# Patient Record
Sex: Female | Born: 1965
Health system: Southern US, Community
[De-identification: ages and names within clinical notes are randomized; demographics above are authoritative.]

## PROBLEM LIST (undated history)

## (undated) DIAGNOSIS — K449 Diaphragmatic hernia without obstruction or gangrene: Secondary | ICD-10-CM

## (undated) DIAGNOSIS — F419 Anxiety disorder, unspecified: Secondary | ICD-10-CM

## (undated) DIAGNOSIS — C439 Malignant melanoma of skin, unspecified: Secondary | ICD-10-CM

## (undated) DIAGNOSIS — K279 Peptic ulcer, site unspecified, unspecified as acute or chronic, without hemorrhage or perforation: Secondary | ICD-10-CM

## (undated) DIAGNOSIS — J42 Unspecified chronic bronchitis: Secondary | ICD-10-CM

## (undated) DIAGNOSIS — J45909 Unspecified asthma, uncomplicated: Secondary | ICD-10-CM

## (undated) DIAGNOSIS — M4316 Spondylolisthesis, lumbar region: Secondary | ICD-10-CM

## (undated) DIAGNOSIS — K219 Gastro-esophageal reflux disease without esophagitis: Secondary | ICD-10-CM

## (undated) DIAGNOSIS — Z973 Presence of spectacles and contact lenses: Secondary | ICD-10-CM

## (undated) DIAGNOSIS — N393 Stress incontinence (female) (male): Secondary | ICD-10-CM

## (undated) HISTORY — DX: Malignant melanoma of skin, unspecified: C43.9

## (undated) HISTORY — DX: Unspecified asthma, uncomplicated: J45.909

## (undated) HISTORY — DX: Peptic ulcer, site unspecified, unspecified as acute or chronic, without hemorrhage or perforation: K27.9

## (undated) HISTORY — PX: ESOPHAGOGASTRODUODENOSCOPY: SHX1529

## (undated) HISTORY — PX: SPINE SURGERY: SHX786

## (undated) HISTORY — DX: Anxiety disorder, unspecified: F41.9

## (undated) HISTORY — PX: KNEE SURGERY: SHX244

## (undated) HISTORY — DX: Gastro-esophageal reflux disease without esophagitis: K21.9

---

## 2002-02-04 HISTORY — PX: KNEE ARTHROSCOPY: SUR90

## 2003-11-09 ENCOUNTER — Emergency Department (HOSPITAL_COMMUNITY): Admission: EM | Admit: 2003-11-09 | Discharge: 2003-11-09 | Payer: Self-pay | Admitting: Emergency Medicine

## 2004-11-20 ENCOUNTER — Emergency Department (HOSPITAL_COMMUNITY): Admission: EM | Admit: 2004-11-20 | Discharge: 2004-11-21 | Payer: Self-pay | Admitting: Emergency Medicine

## 2008-11-30 ENCOUNTER — Emergency Department (HOSPITAL_COMMUNITY): Admission: EM | Admit: 2008-11-30 | Discharge: 2008-11-30 | Payer: Self-pay | Admitting: Family Medicine

## 2009-01-23 ENCOUNTER — Emergency Department (HOSPITAL_COMMUNITY): Admission: EM | Admit: 2009-01-23 | Discharge: 2009-01-23 | Payer: Self-pay | Admitting: Family Medicine

## 2009-03-31 ENCOUNTER — Ambulatory Visit: Payer: Self-pay | Admitting: Family Medicine

## 2009-03-31 ENCOUNTER — Encounter: Payer: Self-pay | Admitting: Family Medicine

## 2009-03-31 DIAGNOSIS — R5383 Other fatigue: Secondary | ICD-10-CM

## 2009-03-31 DIAGNOSIS — R5381 Other malaise: Secondary | ICD-10-CM | POA: Insufficient documentation

## 2009-03-31 DIAGNOSIS — F17200 Nicotine dependence, unspecified, uncomplicated: Secondary | ICD-10-CM | POA: Insufficient documentation

## 2009-03-31 DIAGNOSIS — K644 Residual hemorrhoidal skin tags: Secondary | ICD-10-CM | POA: Insufficient documentation

## 2009-04-03 LAB — CONVERTED CEMR LAB
ALT: 10 units/L (ref 0–35)
AST: 14 units/L (ref 0–37)
Albumin: 4.6 g/dL (ref 3.5–5.2)
Alkaline Phosphatase: 15 units/L — ABNORMAL LOW (ref 39–117)
BUN: 16 mg/dL (ref 6–23)
Basophils Absolute: 0.1 10*3/uL (ref 0.0–0.1)
Basophils Relative: 1 % (ref 0–1)
CO2: 27 meq/L (ref 19–32)
Calcium: 10.4 mg/dL (ref 8.4–10.5)
Chloride: 101 meq/L (ref 96–112)
Creatinine, Ser: 0.97 mg/dL (ref 0.40–1.20)
Eosinophils Absolute: 0.1 10*3/uL (ref 0.0–0.7)
Eosinophils Relative: 1 % (ref 0–5)
Glucose, Bld: 91 mg/dL (ref 70–99)
HCT: 39.9 % (ref 36.0–46.0)
Hemoglobin: 13.2 g/dL (ref 12.0–15.0)
Lymphocytes Relative: 26 % (ref 12–46)
Lymphs Abs: 1.8 10*3/uL (ref 0.7–4.0)
MCHC: 33.1 g/dL (ref 30.0–36.0)
MCV: 89.7 fL (ref 78.0–100.0)
Monocytes Absolute: 0.4 10*3/uL (ref 0.1–1.0)
Monocytes Relative: 6 % (ref 3–12)
Neutro Abs: 4.4 10*3/uL (ref 1.7–7.7)
Neutrophils Relative %: 66 % (ref 43–77)
Platelets: 296 10*3/uL (ref 150–400)
Potassium: 4.2 meq/L (ref 3.5–5.3)
RBC: 4.45 M/uL (ref 3.87–5.11)
RDW: 13.1 % (ref 11.5–15.5)
Sodium: 141 meq/L (ref 135–145)
TSH: 2.11 microintl units/mL (ref 0.350–4.500)
Total Bilirubin: 0.4 mg/dL (ref 0.3–1.2)
Total Protein: 7.2 g/dL (ref 6.0–8.3)
WBC: 6.7 10*3/uL (ref 4.0–10.5)

## 2009-05-08 ENCOUNTER — Encounter: Payer: Self-pay | Admitting: Family Medicine

## 2009-05-08 ENCOUNTER — Ambulatory Visit: Payer: Self-pay | Admitting: Family Medicine

## 2009-05-08 DIAGNOSIS — L130 Dermatitis herpetiformis: Secondary | ICD-10-CM | POA: Insufficient documentation

## 2009-05-11 ENCOUNTER — Encounter: Payer: Self-pay | Admitting: Family Medicine

## 2009-10-08 ENCOUNTER — Emergency Department (HOSPITAL_COMMUNITY): Admission: EM | Admit: 2009-10-08 | Discharge: 2009-10-08 | Payer: Self-pay | Admitting: Family Medicine

## 2009-10-19 ENCOUNTER — Encounter: Payer: Self-pay | Admitting: Family Medicine

## 2009-10-19 ENCOUNTER — Ambulatory Visit: Payer: Self-pay | Admitting: Family Medicine

## 2009-10-19 ENCOUNTER — Encounter: Payer: Self-pay | Admitting: Sports Medicine

## 2009-10-19 LAB — CONVERTED CEMR LAB
Pap Smear: NEGATIVE
Whiff Test: NEGATIVE

## 2009-10-20 ENCOUNTER — Telehealth: Payer: Self-pay | Admitting: *Deleted

## 2009-10-20 LAB — CONVERTED CEMR LAB
Chlamydia, DNA Probe: NEGATIVE
GC Probe Amp, Genital: NEGATIVE
hCG, Beta Chain, Quant, S: <2 milliintl units/mL

## 2009-10-26 ENCOUNTER — Emergency Department (HOSPITAL_COMMUNITY): Admission: EM | Admit: 2009-10-26 | Discharge: 2009-10-26 | Payer: Self-pay | Admitting: Emergency Medicine

## 2009-11-10 ENCOUNTER — Emergency Department (HOSPITAL_COMMUNITY): Admission: EM | Admit: 2009-11-10 | Discharge: 2009-11-10 | Payer: Self-pay | Admitting: Family Medicine

## 2010-03-01 ENCOUNTER — Emergency Department (HOSPITAL_COMMUNITY)
Admission: EM | Admit: 2010-03-01 | Discharge: 2010-03-01 | Payer: Self-pay | Source: Home / Self Care | Admitting: Family Medicine

## 2010-03-01 LAB — POCT RAPID STREP A (OFFICE): Streptococcus, Group A Screen (Direct): NEGATIVE

## 2010-03-07 NOTE — Letter (Signed)
Summary: Handout Printed  Printed Handout:  - Bacterial Vaginosis (BV) 

## 2010-03-07 NOTE — Letter (Signed)
Summary: Generic Letter  [facility]  [address]   [place], [place] [address]   Phone: [phone]  Fax: [phone]    05/08/2009  Candice Pratt [address] [place], [place]  [address]  To Whom It May Concern:  Please be advised that the above-named patient is under the care of [facility]. She should be allowed to return to work when her forearm lesions have crusted over or when we have a negative result on our viral testing (should be back 05/10/09). She should keep the area covered with a tegaderm or similar occlusive dressing until the areas have cleared.  Sincerely,   Myrtie Soman  MD

## 2010-03-07 NOTE — Letter (Signed)
Summary: Generic Letter  [facility]  [address]   [place], [place] [address]   Phone: [phone]  Fax: [phone]    05/11/2009  Candice Pratt [address] [place], [place]  [address]  Dear Ms. BURGGRAF,   I just wanted to let you know that your lab results were normal (negative).  Please call me if you have any questions or concerns.           Sincerely,   Asher Muir MD  Appended Document: Generic Letter mailed.

## 2010-03-07 NOTE — Assessment & Plan Note (Signed)
Summary: NP/[employer]OYEE/KH   Vital Signs:  Patient profile:   45 year old female Height:      70 inches Weight:      169.5 pounds BMI:     24.41 Temp:     98.5 degrees F oral Pulse rate:   91 / minute BP sitting:   135 / 74  (left arm) Cuff size:   regular  Vitals Entered By: Gladstone Pih (March 31, 2009 2:15 PM) CC: NP, fatigue, hemorrhoid, smoking Is Patient Diabetic? No Pain Assessment Patient in pain? no        CC:  NP, fatigue, hemorrhoid, and smoking.  History of Present Illness: 1.  ?hemorrhoids--itchy at anus and can feel a hard knot on outside of anus.  4-6 weeks.  several months ago.   had round worm infection that was treated.  ocassionally speck of blood on toilet paper with bowel movements, but no other bleeding  2.  fatigue--2-3 months ago.  some days feels great.  some days has no energy.  does not think she is depressed.  ususally lasts the whole day.  works as or Stage manager.  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  no hx of signficant anemia.  no thyroid history.  had an episode of dizziness during work at the OR ansd had to leave-->felt bad for a week after that.  blood sugar was checked after that episode and was normal.  no wt loss.  no lad.    3.  smoking--1/2 ppd.  quit for 5 years when her children were young.  goes all day at work w/o smoking.  is contemplating quitting.  has heard about our smoking cessation classes  Habits & Providers  Alcohol-Tobacco-Diet     Tobacco Status: never  Current Medications (verified): 1)  Quasense 0.15-0.03 Mg Tabs (Levonorgest-Eth Estrad 91-Day) .Marland Kitchen.. 1 Tab By Mouth Daily; Dispense 1 90-Day Pack  Past History:  Past Medical History: melanoma vs dysplastic nevus 1997--clean margins.  no lymph node removal.    Past Surgical History: left knee arthroscopy 2005  Family History: mother-- breast cancer diagnosed at 21; thyroid (probably papillary) cancer brother--type 1 diabetes.  had kidney and pancreas  transplant maternal great aunt and possibly grandmother--with alzheimers maternal gm with osteoperosis  Social History: divorced.  lives with teenage sons.  smoke 1/2ppd for total of  17 years (quit for 5 years at one point).  works in the or as Stage manager at [employer].  Marland Kitchen  alcohol ocassional 4-5/month.  no drugs.  2 cats.Smoking Status:  never  Review of Systems General:  Denies chills, fever, and weight loss. CV:  Denies fainting; one near syncopal episode. GI:  Complains of constipation and hemorrhoids. Psych:  Denies depression.  Physical Exam  General:  Well-developed,well-nourished,in no acute distress; alert,appropriate and cooperative throughout examination Eyes:  fundoscopic exam grossly normal; PERRL Ears:  tms and canals clear Nose:  External nasal examination shows no deformity or inflammation. Nasal mucosa are pink and moist without lesions or exudates. Mouth:  Oral mucosa and oropharynx without lesions or exudates.   Neck:  mild shotty anterior lad Lungs:  Normal respiratory effort, chest expands symmetrically. Lungs are clear to auscultation, no crackles or wheezes. Heart:  Normal rate and regular rhythm. S1 and S2 normal without gallop, murmur, click, rub or other extra sounds. Abdomen:  Bowel sounds positive,abdomen soft and non-tender without masses, organomegaly or hernias noted. Rectal:  external exam:  small hemorrhoid at 6 oclock with several small fissures  or pits within the hemorrhoid   Extremities:  no LE edema Psych:  Cognition and judgment appear intact. Alert and cooperative with normal attention span and concentration. No apparent delusions, illusions, hallucinations Additional Exam:  vital signs reviewed    Impression & Recommendations:  Problem # 1:  FATIGUE (ICD-780.79) Assessment New normal exam.  no obvious cause from hx.  will check labs as below.   Orders: Comp Met-[facility] [id number]) CBC w/Diff-[facility] (09811) TSH-[facility] ([id number])  Problem # 2:   HEMORRHOIDS, EXTERNAL (ICD-455.3) Assessment: New  mild.  try proctofoam and sitz baths.   Problem # 3:  TOBACCO ABUSE (ICD-305.1) Assessment: New recommended our smoking cessation classes  Complete Medication List: 1)  Quasense 0.15-0.03 Mg Tabs (Levonorgest-eth estrad 91-day) .Marland Kitchen.. 1 tab by mouth daily; dispense 1 90-day pack 2)  Proctofoam Hc 1-1 % Foam (Hydrocortisone ace-pramoxine) .... Apply to rectum three times a day for one week; then can use as needed; dispense 1 tube  Patient Instructions: 1)  It was nice to meet you today. 2)  For your hemorrhoid, use the proctofoam I prescribed you.   3)  You can also do sitz baths several times a day, which can help with itching. 4)  I will call you with lab results. 5)  Please schedule a follow-up appointment for a physical and pap smear at your convenience.  It would be good if you can schedule it first thing in the morning so we can check fasting labs.  Prescriptions: PROCTOFOAM HC 1-1 % FOAM (HYDROCORTISONE ACE-PRAMOXINE) apply to rectum three times a day for one week; then can use as needed; dispense 1 tube  #1 x 1   Entered and Authorized by:   Asher Muir MD   Signed by:   Asher Muir MD on 03/31/2009   Method used:   Electronically to        [facility]* (retail)       [address]       [address] Shipping/mailing       [place], [place]  [address]       Ph: [phone]       Fax: [phone]   RxID:   [id number] QUASENSE 0.15-0.03 MG TABS (LEVONORGEST-ETH ESTRAD 91-DAY) 1 tab by mouth daily; dispense 1 90-day pack  #1 x 3   Entered and Authorized by:   Asher Muir MD   Signed by:   Asher Muir MD on 03/31/2009   Method used:   Electronically to        [facility]* (retail)       [address]       [address] Shipping[address]mail[address]       [place], [place]  [address]       Ph: [phone]       Fax: [phone]   RxID:   [id number]

## 2010-03-07 NOTE — Assessment & Plan Note (Signed)
Summary: rash L arem/[place]/   Vital Signs:  Patient profile:   45 year old female Height:      168 inches Weight:      70 pounds BMI:     1.75 Temp:     98.8 degrees F Pulse rate:   85 / minute BP sitting:   106 / 70  Vitals Entered By: Golden Circle RN (May 08, 2009 8:50 AM)  History of Present Illness: Rash on left forearm since Wednesday. Is an OR tech. Started out as a cluster of blisters that have been weeping since this am. No weeping since then. All lesions have been present since Wednesday, no new lesions. Initially they tingled and hurt. Since have been intermittently itchy.   Has this kind of thing from time to time. Probably 4-5 times over the past year.  fevers:  no   chills: no    nausea: no    vomiting: no    diarrhea: no     chest pain:     shortness of breath:      no contacts with persons having a similar rash. does play with dogs who go outside.   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   PMHx: had chicken pox at age 17. h/o ocular herpes.   Current Medications (verified): 1)  Quasense 0.15-0.03 Mg Tabs (Levonorgest-Eth Estrad 91-Day) .Marland Kitchen.. 1 Tab By Mouth Daily; Dispense 1 90-Day Pack  Allergies (verified): No Known Drug Allergies  Past History:  Past Medical History: melanoma vs dysplastic nevus 1997--clean margins.  no lymph node removal.   history of ocular HSV with some corneal scarring.   Review of Systems       review of systems as noted in HPI section   Physical Exam  General:  vital signs reviewed and normal Alert, appropriate; well-dressed and well-nourished  Skin:  2 patches of lesions on L forearm, largest measuring 1 x 0.5 cm. Smaller area looks vesicular and larger area with lesions that appear to have broken and crusted over.   Smaller area of vesicles are unroofed with a 25 g needle and viral culture obtained.    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 But given history of multiple similar episodes, this makes zoster less likely. Will await culture results. Pt to remain out of work until results back or lesions have crusted over. No indication for antiviral therapy or steroids at this time.   Orders: Miscellaneous Lab Charge-[facility] ([id number]) [facility]- Est  Level 4 ([address]1478)  Complete Medication List: 1)  Quasense 0.15-0.03 Mg Tabs (Levonorgest-eth estrad 91-day) .Marland Kitchen.. 1 tab by mouth daily; dispense 1 90-day pack  Patient Instructions: 1)  we should have the test result by Wednesday. 2)  you should remain out of work until then. 3)  You can return to work when the areas have crusted over or we have a negative result.   Appended Document: rash L arem/[place]/ clarification: HSV and VZV antigen DFA and reflex culture sent; not HSV PCR.

## 2010-03-07 NOTE — Assessment & Plan Note (Signed)
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  Pt thinks she had a misscarriage over labor day weekend. here for f/u of that and clear vag DC.  she missed 2 weeks of OCPs in July. resumed in Aug. started with heavy bleeding & cramps on the friday before Labor Day. used Advil. passed several large clots & something that looked like products of conception. Home preg test was negative. went to [facility] sunday as she was in a lot of pain & heavy bleeding. their preg test was neg there too. no blood work done. was better on Monday. bled a total of 9 days which is very unusual for her. now has a clear d/c & vaginal irritation. she is not upset at the thought of having a misscarriage. states she is 24 & has an 43 yr old so did not want a pregnancy.   No fevers/chills, N/V/D/C, no cramping today, bleeding has stopped.   Due for PAP.  Also with irritated area around anus, feels a bump when she wipes, has been straining for stools, burns a little when wiped.  Current Medications (verified): 1)  Quasense 0.15-0.03 Mg Tabs (Levonorgest-Eth Estrad 91-Day) .Marland Kitchen.. 1 Tab By Mouth Daily; Dispense 1 90-Day Pack 2)  Metronidazole 500 Mg Tabs (Metronidazole) .... One Tab By Mouth Two Times A Day X 7d 3)  Tucks 50 % Pads [facility]) .... Use After Stooling and Cleaning. 4)  Anusol-Hc 25 Mg Supp (Hydrocortisone Acetate) .... Use Pr Two Times A Day  Allergies (verified): No Known Drug Allergies  Review of Systems       See HPI  Physical Exam  General:  Well-developed,well-nourished,in no acute distress;  alert,appropriate and cooperative throughout examination Lungs:  Normal respiratory effort, chest expands symmetrically. Lungs are clear to auscultation, no crackles or wheezes. Heart:  Normal rate and regular rhythm. S1 and S2 normal without gallop, murmur, click, rub or other extra sounds. Abdomen:  Bowel sounds positive,abdomen soft and non-tender without masses, organomegaly or hernias noted. Genitalia:  Normal introitus for age, no external lesions, no vaginal discharge, mucosa pink and moist, no vaginal or cervical lesions, no vaginal atrophy, no friaility or hemorrhage, normal uterus size and position, no adnexal masses or tenderness.  minimall spotting after PAP done.  Small amount of mucus from cervix.   Impression & Recommendations:  Problem # 1:  ? of ABORTION, MISSED (ICD-632) Assessment New Cervix closed, missed pills for 2 wks, had intercourse, if passed POCs, would be missed Ab.  Upreg neg x 2 to unlikely, will check a bHCG to be sure.  Advised RTC if fevers/chills/cramps/abd pain.  Orders: B-HCG Quant-[facility] ([id number]) [facility]- Est  Level 4 (09811)  Problem # 2:  VAGINITIS&VULVOVAGINITIS DISEASES CLASS ELSW (BJY-782.95) Assessment: New With c/o vag DC, wet prep with clue cells, will tx for BV with flagyl 500 two times a day x 7d and test for GC/Chlam. Handout given.  Orders: GC/Chlamydia-[facility] ([id number]) [facility]- Est  Level  4 (04540)  Problem # 3:  HEMORRHOIDS, EXTERNAL (ICD-455.3) Assessment: Deteriorated Hx ext hemorrhoids.  Symptoms suggestive today.  Wil tx with witch hazel, anusol, colace. RTC as needed.   Complete Medication List: 1)  Quasense 0.15-0.03 Mg Tabs (Levonorgest-eth estrad 91-day) .Marland Kitchen.. 1 tab by mouth daily; dispense 1 90-day pack 2)  Metronidazole 500 Mg Tabs (Metronidazole) .... One tab by mouth two times a day x 7d 3)  Tucks 50 % Pads (Witch hazel) .... Use after stooling and cleaning. 4)  Anusol-hc 25 Mg Supp (Hydrocortisone acetate) .... Use pr two  times a day  [id number][facility] Pap Smear-[facility] ([id number])  Patient Instructions: 1)  Great to meet you, 2)  Will let you know if any of your tests come back positive, otherwise continue your pills as you have been doing. 3)  -Dr. Karie Schwalbe. Prescriptions: ANUSOL-HC 25 MG SUPP (HYDROCORTISONE ACETATE) Use PR two times a day  #1 box x 6   Entered and Authorized by:   Rodney Langton MD   Signed by:   Rodney Langton MD on 10/19/2009   Method used:   Print then Give to Patient   RxID:   [id number] TUCKS 50 % PADS [facility] HAZEL) Use after stooling and cleaning.  #1 box x 6   Entered and Authorized by:   Rodney Langton MD   Signed by:   Rodney Langton MD on 10/19/2009   Method used:   Print then Give to Patient   RxID:   [id number] METRONIDAZOLE 500 MG TABS (METRONIDAZOLE) One tab by mouth two times a day x 7d  #14 x 0   Entered and Authorized by:   Rodney Langton MD   Signed by:   Rodney Langton MD on 10/19/2009   Method used:   Print then Give to Patient   RxID:   [id number]   Laboratory Results  Date/Time Received: October 19, 2009 10:36 AM  Date/Time Reported: October 19, 2009 10:50 AM   Wet [place] Source: vag WBC/hpf: >20 Bacteria/hpf: 3+  Cocci Clue cells/hpf: moderate  Negative whiff Yeast/hpf: none Trichomonas/hpf: none Comments: ...............test performed by......Marland KitchenBonnie A. Swazil[place], MLS (ASCP)cm

## 2010-03-07 NOTE — Miscellaneous (Signed)
Summary: walk in  Clinical Lists Changes came in stating she is pretty sure she had a misscarriage over labor day weekend. here for f/u of that & chafing shin on vulva area from wearing pads.  she missed 2 weeks of pills in July. resumed in Aug. started with heavy bleeding & cramps on the friday before Labor Day. had some cramps. used Advil. passed several large cramps & something that looked like a pregnancy. 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 the home preg test was negative. went to [facility] sunday as she was in a lo tof pain & heavy bleeding. their preg test was neg. no blood work done. was better on Monday. bled a total of 9 days which is very unusual for her. now has a clear d/c & a chapped area from wearing pads she states. had also been constipated but things are back to normal now.  she is not upset at the thought of having a misscarriage. states she is 19 & has an 43 yr old so did not want a pregnancy. placed in work in to be seen by Dr. Inetta Fermo RN  October 19, 2009 9:08 AM

## 2010-03-07 NOTE — Progress Notes (Signed)
Summary: phone note  Phone Note Outgoing Call   Call placed by: Loralee Pacas CMA,  October 20, 2009 4:36 PM Summary of Call: called and lvm for pt to call back  Follow-up for Phone Call        pt returning call, waiting on results of pap.  Follow-up by: Tessie Fass CMA,  October 23, 2009 9:04 AM  Additional Follow-up for Phone Call Additional follow up Details #1::        PAP negative for signs of cervical cancer. Additional Follow-up by: Rodney Langton MD,  October 23, 2009 8:45 PM

## 2010-03-23 ENCOUNTER — Encounter: Payer: Self-pay | Admitting: *Deleted

## 2010-04-19 LAB — URINE CULTURE
Colony Count: >=100000
Culture  Setup Time: 201110071434

## 2010-04-19 LAB — POCT URINALYSIS DIPSTICK
Bilirubin Urine: NEGATIVE
Glucose, UA: NEGATIVE mg/dL
Glucose, UA: NEGATIVE mg/dL
Ketones, ur: NEGATIVE mg/dL
Ketones, ur: NEGATIVE mg/dL
Nitrite: NEGATIVE
Nitrite: NEGATIVE
Protein, ur: NEGATIVE mg/dL
Protein, ur: NEGATIVE mg/dL
Specific Gravity, Urine: 1.025 (ref 1.005–1.030)
Specific Gravity, Urine: 1.025 (ref 1.005–1.030)
Urobilinogen, UA: 0.2 mg/dL (ref 0.0–1.0)
Urobilinogen, UA: 0.2 mg/dL (ref 0.0–1.0)
pH: 5.5 (ref 5.0–8.0)
pH: 5 (ref 5.0–8.0)

## 2010-04-19 LAB — PREGNANCY, URINE: Preg Test, Ur: NEGATIVE

## 2010-04-19 LAB — GC/CHLAMYDIA PROBE AMP, GENITAL
Chlamydia, DNA Probe: NEGATIVE
GC Probe Amp, Genital: NEGATIVE

## 2010-04-19 LAB — WET PREP, GENITAL
Trich, Wet Prep: NONE SEEN
Yeast Wet Prep HPF POC: NONE SEEN

## 2010-04-19 LAB — POCT PREGNANCY, URINE
Preg Test, Ur: NEGATIVE
Preg Test, Ur: NEGATIVE
Preg Test, Ur: NEGATIVE

## 2010-05-11 ENCOUNTER — Inpatient Hospital Stay (INDEPENDENT_AMBULATORY_CARE_PROVIDER_SITE_OTHER)
Admission: RE | Admit: 2010-05-11 | Discharge: 2010-05-11 | Disposition: A | Discharge status: Home / Self Care | Payer: 59 | Source: Ambulatory Visit | Attending: Emergency Medicine | Admitting: Emergency Medicine

## 2010-05-11 DIAGNOSIS — R05 Cough: Secondary | ICD-10-CM

## 2010-05-11 DIAGNOSIS — J45909 Unspecified asthma, uncomplicated: Secondary | ICD-10-CM

## 2010-05-11 DIAGNOSIS — R059 Cough, unspecified: Secondary | ICD-10-CM

## 2010-05-11 LAB — POCT RAPID STREP A (OFFICE): Streptococcus, Group A Screen (Direct): NEGATIVE

## 2010-11-26 ENCOUNTER — Inpatient Hospital Stay (INDEPENDENT_AMBULATORY_CARE_PROVIDER_SITE_OTHER)
Admission: RE | Admit: 2010-11-26 | Discharge: 2010-11-26 | Disposition: A | Discharge status: Home / Self Care | Payer: 59 | Source: Ambulatory Visit | Attending: Emergency Medicine | Admitting: Emergency Medicine

## 2010-11-26 DIAGNOSIS — N390 Urinary tract infection, site not specified: Secondary | ICD-10-CM

## 2010-11-26 LAB — POCT URINALYSIS DIP (DEVICE)
Bilirubin Urine: NEGATIVE
Glucose, UA: NEGATIVE mg/dL
Ketones, ur: NEGATIVE mg/dL
Nitrite: NEGATIVE
Protein, ur: NEGATIVE mg/dL
Specific Gravity, Urine: 1.02 (ref 1.005–1.030)
Urobilinogen, UA: 0.2 mg/dL (ref 0.0–1.0)
pH: 5 (ref 5.0–8.0)

## 2010-11-26 LAB — POCT PREGNANCY, URINE: Preg Test, Ur: NEGATIVE

## 2011-04-25 ENCOUNTER — Encounter (HOSPITAL_COMMUNITY): Payer: Self-pay | Admitting: Emergency Medicine

## 2011-04-25 ENCOUNTER — Emergency Department (INDEPENDENT_AMBULATORY_CARE_PROVIDER_SITE_OTHER)
Admission: EM | Admit: 2011-04-25 | Discharge: 2011-04-25 | Disposition: A | Discharge status: Home / Self Care | Payer: 59 | Source: Home / Self Care

## 2011-04-25 DIAGNOSIS — H109 Unspecified conjunctivitis: Secondary | ICD-10-CM

## 2011-04-25 MED ORDER — CIPROFLOXACIN HCL 0.3 % OP SOLN: OPHTHALMIC | Status: DC

## 2011-04-25 NOTE — ED Notes (Signed)
Pt works in the [employer] and two other coworkers have had it. She woke up with it and it's the left eye. Has not spread in the right eye.

## 2011-04-25 NOTE — ED Provider Notes (Signed)
History     CSN: [id number]  Arrival date & time 04/25/11  1047   None     Chief Complaint  Patient presents with  . Conjunctivitis    (Consider location/radiation/quality/duration/timing/severity/associated sxs/prior treatment) HPI Comments: Patient has developed redness and irritation in her left eye today. No mucous or crusting. She denies eye pain or visual changes. She does wear contacts. 2 coworkers recently had pinkeye. No URI symptoms   History reviewed. No pertinent past medical history.  History reviewed. No pertinent past surgical history.  History reviewed. No pertinent family history.  History  Substance Use Topics  . Smoking status: Current Everyday Smoker -- 0.5 packs/day for 20 years    Types: Cigarettes  . Smokeless tobacco: Never Used  . Alcohol Use: 0.6 oz/week    1 Glasses of wine per week    OB History    Grav Para Term Preterm Abortions TAB SAB Ect Mult Living                  Review of Systems  Constitutional: Positive for fatigue. Negative for fever and chills.  HENT: Negative for ear pain, congestion, sore throat and rhinorrhea.   Eyes: Positive for redness. Negative for pain, discharge, itching and visual disturbance.  Respiratory: Positive for wheezing. Negative for cough.     Allergies  Review of patient's allergies indicates no known allergies.  Home Medications   Current Outpatient Rx  Name Route Sig Dispense Refill  . CIPROFLOXACIN HCL 0.3 % OP SOLN  2 gtts TID Lt eye x 7 days 5 mL 0  . HYDROCORTISONE ACETATE 25 MG RE SUPP Rectal Place 25 mg rectally 2 (two) times daily.      Marland Kitchen LEVONORGEST-ETH ESTRAD 91-DAY 0.15-0.03 MG PO TABS Oral Take 1 tablet by mouth daily.      Marland Kitchen METRONIDAZOLE 500 MG PO TABS Oral Take 500 mg by mouth 2 (two) times daily.      Weyman Croon HAZEL-GLYCERIN EX PADS Rectal Place rectally as needed.        BP 113/68  Pulse 80  Temp(Src) 98.9 F (37.2 C) (Oral)  Resp 16  SpO2 99%  LMP 04/24/2011  Physical  Exam  Nursing note and vitals reviewed. Constitutional: She appears well-developed and well-nourished. No distress.  HENT:  Head: Normocephalic and atraumatic.  Right Ear: Tympanic membrane, external ear and ear canal normal.  Left Ear: Tympanic membrane, external ear and ear canal normal.  Nose: Nose normal.  Mouth/Throat: Uvula is midline, oropharynx is clear and moist and mucous membranes are normal. No oropharyngeal exudate, posterior oropharyngeal edema or posterior oropharyngeal erythema.  Eyes: EOM and lids are normal. Pupils are equal, round, and reactive to light. Right conjunctiva is not injected. Right conjunctiva has no hemorrhage. Left conjunctiva is injected. Left conjunctiva has no hemorrhage.  Neck: Neck supple.  Cardiovascular: Normal rate, regular rhythm and normal heart sounds.   Pulmonary/Chest: Effort normal and breath sounds normal. No respiratory distress.  Lymphadenopathy:    She has no cervical adenopathy.  Neurological: She is alert.  Skin: Skin is warm and dry.  Psychiatric: She has a normal mood and affect.    ED Course  Procedures (including critical care time)  Labs Reviewed - No data to display No results found.   1. Conjunctivitis       MDM          Melody Comas, PA 04/25/11 1422

## 2011-04-25 NOTE — Discharge Instructions (Signed)
Use warm compresses to eyes. Avoid rubbing eyes. Conjunctivitis is spread by touch. Wash your hands frequently, especially after touching your eyes to avoid spreading to others. Return as needed.  Conjunctivitis Conjunctivitis is commonly called "pink eye." Conjunctivitis can be caused by bacterial or viral infection, allergies, or injuries. There is usually redness of the lining of the eye, itching, discomfort, and sometimes discharge. There may be deposits of matter along the eyelids. A viral infection usually causes a watery discharge, while a bacterial infection causes a yellowish, thick discharge. Pink eye is very contagious and spreads by direct contact. You may be given antibiotic eyedrops as part of your treatment. Before using your eye medicine, remove all drainage from the eye by washing gently with warm water and cotton balls. Continue to use the medication until you have awakened 2 mornings in a row without discharge from the eye. Do not rub your eye. This increases the irritation and helps spread infection. Use separate towels from other household members. Wash your hands with soap and water before and after touching your eyes. Use cold compresses to reduce pain and sunglasses to relieve irritation from light. Do not wear contact lenses or wear eye makeup until the infection is gone. SEEK MEDICAL CARE IF:   Your symptoms are not better after 3 days of treatment.   You have increased pain or trouble seeing.   The outer eyelids become very red or swollen.  Document Released: 02/29/2004 Document Revised: 01/10/2011 Document Reviewed: 01/21/2005 [facility] Patient Information 2012 [place], [place].

## 2011-05-06 NOTE — ED Provider Notes (Signed)
Medical screening examination/treatment/procedure(s) were performed by non-physician practitioner and as supervising physician I was immediately available for consultation/collaboration.  Orton Capell G  D.O.    Eshal Propps G Marwah Disbro, MD 05/06/11 0815 

## 2011-12-15 ENCOUNTER — Emergency Department (HOSPITAL_COMMUNITY)
Admission: EM | Admit: 2011-12-15 | Discharge: 2011-12-15 | Disposition: A | Discharge status: Home / Self Care | Payer: 59 | Source: Home / Self Care

## 2011-12-15 ENCOUNTER — Encounter (HOSPITAL_COMMUNITY): Payer: Self-pay | Admitting: Emergency Medicine

## 2011-12-15 DIAGNOSIS — L0390 Cellulitis, unspecified: Secondary | ICD-10-CM

## 2011-12-15 DIAGNOSIS — L0291 Cutaneous abscess, unspecified: Secondary | ICD-10-CM

## 2011-12-15 MED ORDER — CEPHALEXIN 500 MG PO CAPS: 500.0000 mg | ORAL_CAPSULE | Freq: Three times a day (TID) | ORAL | Status: DC

## 2011-12-15 NOTE — ED Notes (Signed)
C/o of abscess in pubic area x 2 wks. Pt states she drained area a wk ago but still has not resolved. Area is more swollen/red and painful.   Pt states that she has an allergy to an antibiotic but cant remember the name.   Pt has used hot compresses and draining with no relief.

## 2011-12-15 NOTE — ED Provider Notes (Signed)
History     CSN: [id number]  Arrival date & time 12/15/11  1153   None     Chief Complaint  Patient presents with  . Abscess    abscess on pubic area x 2 wks. pt has drained area once a wk ago but is still present and even more painful,red and swollen.    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 It began as a small pimple and continued to increase in size forming a small area of induration and overlying erythema. She states it was quite tender for several days. It did drain 2-3 days ago in improved somewhat. Yesterday she accidentally bumped the area against an object and soon after it was feeling much better. She denies systemic symptoms such as malaise, fever or chills.   History reviewed. No pertinent past medical history.  Past Surgical History  Procedure Date  . Knee surgery     Family History  Problem Relation Age of Onset  . Diabetes Other   . Cancer Other     History  Substance Use Topics  . Smoking status: Current Every Day Smoker -- 0.5 packs/day for 20 years    Types: Cigarettes  . Smokeless tobacco: Never Used  . Alcohol Use: 0.6 oz/week    1 Glasses of wine per week    OB History    Grav Para Term Preterm Abortions TAB SAB Ect Mult Living                  Review of Systems  All other systems reviewed and are negative.    Allergies  Review of patient's allergies indicates no known allergies.  Home Medications   Current Outpatient Rx  Name  Route  Sig  Dispense  Refill  . CEPHALEXIN 500 MG PO CAPS   Oral   Take 1 capsule (500 mg total) by mouth 3 (three) times daily.   21 capsule   0   . CIPROFLOXACIN HCL 0.3 % OP SOLN      2 gtts TID Lt eye x 7 days   5 mL   0   . HYDROCORTISONE ACETATE 25 MG RE SUPP   Rectal   Place 25 mg rectally 2 (two) times  daily.           Marland Kitchen LEVONORGEST-ETH ESTRAD 91-DAY 0.15-0.03 MG PO TABS   Oral   Take 1 tablet by mouth daily.           Marland Kitchen METRONIDAZOLE 500 MG PO TABS   Oral   Take 500 mg by mouth 2 (two) times daily.           Weyman Croon HAZEL-GLYCERIN EX PADS   Rectal   Place rectally as needed.             BP 147/71  Pulse 81  Temp 99.5 F (37.5 C) (Oral)  Resp 19  SpO2 100%  LMP 12/15/2011  Physical Exam  Constitutional: She is oriented to person, place, and time. She appears well-developed and well-nourished. No distress.  Eyes: EOM are normal.  Neck: Normal range of motion. Neck supple.  Neurological: She is alert and oriented to person, place, and time.  Skin: Skin is warm and dry.       There is an approximately 2 cm diameter area of cutaneous erythema in the mid suprapubic. There is no underlying induration, there is  no drainage and there is no current pustule. Per history, the area has significantly decreased in size.  Psychiatric: She has a normal mood and affect.    ED Course  Procedures (including critical care time)  Labs Reviewed - No data to display No results found.   1. Cellulitis and abscess of unspecified site       MDM  Warm compresses to affected area several times during the day for Keflex 500 mg 3 times a day for 7 days Any worsening or no improvement return or may call your doctor for appointment. This actually looks more like a follicular infection than an abscess., Said she "bumped it" yesterday and the lesion has been improving significantly with decrease in size and intensity of tenderness.. There is no drainage and no underlying induration.         Hayden Rasmussen, NP 12/15/11 1319

## 2011-12-15 NOTE — ED Provider Notes (Signed)
Medical screening examination/treatment/procedure(s) were performed by resident physician or non-physician practitioner and as supervising physician I was immediately available for consultation/collaboration.   Gimena Buick DOUGLAS MD.    Gaylan Fauver D Malaiyah Achorn, MD 12/15/11 1343 

## 2012-05-04 ENCOUNTER — Ambulatory Visit: Payer: Self-pay

## 2012-05-04 ENCOUNTER — Other Ambulatory Visit: Payer: Self-pay | Admitting: Occupational Medicine

## 2012-05-04 DIAGNOSIS — R7612 Nonspecific reaction to cell mediated immunity measurement of gamma interferon antigen response without active tuberculosis: Secondary | ICD-10-CM

## 2012-06-08 ENCOUNTER — Encounter (HOSPITAL_COMMUNITY): Payer: Self-pay | Admitting: Emergency Medicine

## 2012-06-08 ENCOUNTER — Emergency Department (HOSPITAL_COMMUNITY)
Admission: EM | Admit: 2012-06-08 | Discharge: 2012-06-08 | Disposition: A | Discharge status: Home / Self Care | Payer: 59 | Source: Home / Self Care | Attending: Emergency Medicine | Admitting: Emergency Medicine

## 2012-06-08 DIAGNOSIS — N390 Urinary tract infection, site not specified: Secondary | ICD-10-CM

## 2012-06-08 LAB — POCT URINALYSIS DIP (DEVICE)
Glucose, UA: NEGATIVE mg/dL
Ketones, ur: NEGATIVE mg/dL
Nitrite: POSITIVE — AB
Protein, ur: 100 mg/dL — AB
Specific Gravity, Urine: 1.025 (ref 1.005–1.030)
Urobilinogen, UA: 0.2 mg/dL (ref 0.0–1.0)
pH: 5 (ref 5.0–8.0)

## 2012-06-08 MED ORDER — CEPHALEXIN 500 MG PO CAPS: 500.0000 mg | ORAL_CAPSULE | Freq: Three times a day (TID) | ORAL | Status: AC

## 2012-06-08 MED ORDER — PHENAZOPYRIDINE HCL 200 MG PO TABS: 200.0000 mg | ORAL_TABLET | Freq: Three times a day (TID) | ORAL | Status: DC

## 2012-06-08 NOTE — ED Notes (Signed)
Pt c/o hematuria and dysuria onset this morning. Feels urgency but only able to urinate small amounts. No abdominal cramping or discharge. Patient is alert and oriented.

## 2012-06-08 NOTE — ED Provider Notes (Signed)
History     CSN: [id number]  Arrival date & time 06/08/12  1003   First MD Initiated Contact with Patient 06/08/12 1036      Chief Complaint  Patient presents with  . Urinary Tract Infection    (Consider location/radiation/quality/duration/timing/severity/associated sxs/prior treatment) Patient is a 47 y.o. female presenting with dysuria. The history is provided by the patient.  Dysuria  This is a new problem. The current episode started more than 2 days ago. The problem occurs every urination. The problem has been gradually worsening. The pain is at a severity of 4/10. The pain is moderate. There has been no fever. Associated symptoms include frequency, hematuria, hesitancy and urgency. Pertinent negatives include no sweats, no vomiting, no possible pregnancy and no flank pain. She has tried nothing for the symptoms. Her past medical history does not include kidney stones, recurrent UTIs, urinary stasis or catheterization.    History reviewed. No pertinent past medical history.  Past Surgical History  Procedure Laterality Date  . Knee surgery      Family History  Problem Relation Age of Onset  . Diabetes Other   . Cancer Other     History  Substance Use Topics  . Smoking status: Current Every Day Smoker -- 0.50 packs/day for 20 years    Types: Cigarettes  . Smokeless tobacco: Never Used  . Alcohol Use: 0.6 oz/week    1 Glasses of wine per week    OB History   Grav Para Term Preterm Abortions TAB SAB Ect Mult Living                  Review of Systems  Constitutional: Negative for fever, activity change and appetite change.  Gastrointestinal: Negative for vomiting.  Genitourinary: Positive for dysuria, hesitancy, urgency, frequency and hematuria. Negative for flank pain, decreased urine volume, vaginal bleeding, vaginal discharge, difficulty urinating and dyspareunia.    Allergies  Review of patient's allergies indicates no known allergies.  Home Medications    Current Outpatient Rx  Name  Route  Sig  Dispense  Refill  . cephALEXin (KEFLEX) 500 MG capsule   Oral   Take 1 capsule (500 mg total) by mouth 3 (three) times daily.   21 capsule   0   . phenazopyridine (PYRIDIUM) 200 MG tablet   Oral   Take 1 tablet (200 mg total) by mouth 3 (three) times daily.   6 tablet   0     BP 120/78  Pulse 79  Temp(Src) 98.3 F (36.8 C) (Oral)  Resp 16  SpO2 97%  LMP 06/01/2012  Physical Exam  Nursing note and vitals reviewed. Constitutional: She is oriented to person, place, and time. She appears well-developed and well-nourished.  HENT:  Head: Normocephalic.  Abdominal: Soft. Normal appearance. There is no tenderness. There is no rebound, no guarding and no CVA tenderness.  Neurological: She is alert and oriented to person, place, and time.  Skin: No erythema.    ED Course  Procedures (including critical care time)  Labs Reviewed  POCT URINALYSIS DIP (DEVICE) - Abnormal; Notable for the following:    Bilirubin Urine SMALL (*)    Hgb urine dipstick LARGE (*)    Protein, ur 100 (*)    Nitrite POSITIVE (*)    Leukocytes, UA LARGE (*)    All other components within normal limits  URINE CULTURE  POCT PREGNANCY, URINE   No results found.   1. Urinary tract infection       MDM  Uncomplicated UTI-CYSTITIS Ur Cxs ordered- Keflex x 7 days + Pyridium        Jimmie Molly, MD 06/08/12 1046

## 2012-06-09 LAB — URINE CULTURE

## 2012-06-11 NOTE — ED Notes (Signed)
Urine culture: >100,000 colonies E. Coli. Pt. adequately treated with Keflex. Vassie Moselle 06/11/2012

## 2012-09-23 ENCOUNTER — Emergency Department (INDEPENDENT_AMBULATORY_CARE_PROVIDER_SITE_OTHER): Payer: 59

## 2012-09-23 ENCOUNTER — Emergency Department (HOSPITAL_COMMUNITY)
Admission: EM | Admit: 2012-09-23 | Discharge: 2012-09-23 | Disposition: A | Discharge status: Home / Self Care | Payer: 59 | Source: Home / Self Care | Attending: Family Medicine | Admitting: Family Medicine

## 2012-09-23 ENCOUNTER — Encounter (HOSPITAL_COMMUNITY): Payer: Self-pay | Admitting: Emergency Medicine

## 2012-09-23 DIAGNOSIS — R05 Cough: Secondary | ICD-10-CM

## 2012-09-23 DIAGNOSIS — R059 Cough, unspecified: Secondary | ICD-10-CM

## 2012-09-23 DIAGNOSIS — J40 Bronchitis, not specified as acute or chronic: Secondary | ICD-10-CM

## 2012-09-23 MED ORDER — ALBUTEROL SULFATE HFA 108 (90 BASE) MCG/ACT IN AERS: 1.0000 | INHALATION_SPRAY | Freq: Four times a day (QID) | RESPIRATORY_TRACT | Status: DC | PRN

## 2012-09-23 MED ORDER — MUPIROCIN CALCIUM 2 % NA OINT: TOPICAL_OINTMENT | Freq: Two times a day (BID) | NASAL | Status: DC

## 2012-09-23 MED ORDER — FLUTICASONE PROPIONATE HFA 110 MCG/ACT IN AERO: 1.0000 | INHALATION_SPRAY | Freq: Two times a day (BID) | RESPIRATORY_TRACT | Status: DC

## 2012-09-23 MED ORDER — BENZONATATE 100 MG PO CAPS: 100.0000 mg | ORAL_CAPSULE | Freq: Three times a day (TID) | ORAL | Status: DC

## 2012-09-23 NOTE — ED Notes (Addendum)
Pt c/o persistent bronchtis onset 1 week Reports she was seen at an Urgent Care in [place], [place]... Given Levofloxacin, prednisone, and Cheratussin w/no relief.  Tolerating meds well Sxs include: fatigue, weakness, SOB, wheezing, chills, feels warm, chest tightness, cough w/yellow phlegm, PND Smokes 0.5 PPD.... Alert w/no signs of acute distress.

## 2012-09-23 NOTE — ED Provider Notes (Signed)
CSN: [id number]     Arrival date & time 09/23/12  1526 History     First MD Initiated Contact with Patient 09/23/12 1637     Chief Complaint  Patient presents with  . Bronchitis   (Consider location/radiation/quality/duration/timing/severity/associated sxs/prior Treatment) HPI Comments: 47 year old female smoker (20 year pack history) here complaining of persistent cough productive of a yellow sputum for one week. She has been seen in urgent care in [facility] and is on day 6/10 of levofloxacillin, she also completed, prednisone for 5 days states she was feeling better when she was on the prednisone. Bad she has recurrent intermittent coughing spells that are making her feel tired. Reports diaphragmatic discomfort with cough but denies pleuritic type of pain or chest pain. Reports intermittent wheezing with coughing and but denies shortness of breath on exertion leg swelling or PND. No fever although reports chills intermittently. Appetite still mildly decreased patient taking plenty of fluids no nausea vomiting or diarrhea. No known recent TB exposure no travel outside the country. She has not smoked during the last week. Reports she has smoked one pack a day for about 20 years she reports cutting 2  half a pack a day for several months. Has never been diagnosed with COPD. 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    History reviewed. No pertinent past medical history. Past Surgical History  Procedure Laterality Date  . Knee surgery     Family History  Problem Relation Age of Onset  . Diabetes Other   . Cancer Other    History  Substance Use Topics  . Smoking status: Current Every Day Smoker -- 0.50 packs/day for 20 years    Types: Cigarettes  . Smokeless tobacco: Never Used  . Alcohol Use: 0.6 oz/week    1 Glasses of wine per week   OB History   Grav Para Term Preterm Abortions TAB SAB Ect Mult Living                 Review of Systems   Constitutional: Positive for fatigue. Negative for diaphoresis.  HENT: Negative for congestion and sore throat.   Respiratory: Positive for cough, shortness of breath and wheezing.   Cardiovascular: Negative for chest pain, palpitations and leg swelling.  Gastrointestinal: Negative for nausea, vomiting, abdominal pain and diarrhea.  Skin: Negative for rash.  Neurological: Negative for dizziness.  All other systems reviewed and are negative.    Allergies  Review of patient's allergies indicates no known allergies.  Home Medications   Current Outpatient Rx  Name  Route  Sig  Dispense  Refill  . guaiFENesin-codeine (ROBITUSSIN AC) 100-10 MG/5ML syrup   Oral   Take 5 mLs by mouth 3 (three) times daily as needed for cough.         Marland Kitchen levofloxacin (LEVAQUIN) 500 MG tablet   Oral   Take 500 mg by mouth daily.         Marland Kitchen albuterol (PROVENTIL HFA;VENTOLIN HFA) 108 (90 BASE) MCG/ACT inhaler   Inhalation   Inhale 1-2 puffs into the lungs every 6 (six) hours as needed for wheezing or shortness of breath (or cough spells).   1 Inhaler   0   . benzonatate (TESSALON) 100 MG capsule   Oral   Take 1 capsule (100 mg total) by mouth every 8 (eight) hours.   21 capsule   0   . fluticasone (FLOVENT HFA) 110 MCG/ACT inhaler   Inhalation  Inhale 1 puff into the lungs 2 (two) times daily.   1 Inhaler   0   . mupirocin nasal ointment (BACTROBAN) 2 %   Nasal   Place into the nose 2 (two) times daily. Use one-half of tube in each nostril twice daily for five (5) days. After application, press sides of nose together and gently massage.   10 g   0    BP 110/82  Pulse 86  Temp(Src) 98 F (36.7 C) (Oral)  Resp 16  SpO2 99% Physical Exam  Nursing note and vitals reviewed. Constitutional: She is oriented to person, place, and time. She appears well-developed and well-nourished. No distress.  HENT:  Head: Normocephalic and atraumatic.  Mouth/Throat: Oropharynx is clear and moist. No  oropharyngeal exudate.  There is mild erythema and swelling at the level of the septum in the right nares.   Eyes: Conjunctivae are normal. Right eye exhibits no discharge. Left eye exhibits no discharge. No scleral icterus.  Neck: Neck supple. No JVD present. No thyromegaly present.  Cardiovascular: Normal rate, regular rhythm and normal heart sounds.  Exam reveals no gallop and no friction rub.   No murmur heard. Pulmonary/Chest: Effort normal and breath sounds normal. No respiratory distress. She has no wheezes. She has no rales. She exhibits no tenderness.  Lymphadenopathy:    She has no cervical adenopathy.  Neurological: She is alert and oriented to person, place, and time.  Skin: No rash noted. She is not diaphoretic.    ED Course   Procedures (including critical care time)  Labs Reviewed - No data to display No results found. 1. Cough   2. Bronchitis     MDM  Lungs are clear to auscultation here. Nontoxic appearance with normal vital signs. Order a chest x-ray and we'll call patient with results if any abnormality. Impress symptoms possibly related with still recovering from bronchitis versus developing COPD. Prescribed albuterol inhaler and Flovent inhaler. Recommended to establish care with a primary care provider who can monitor her symptoms. Also provided pulmonology referral to follow up as needed if not improving or recurrent symptoms. Asked to go to the emergency department if fever, chest pain or worsening symptoms despite following treatment. The  Sharin Grave, MD 09/24/12 [phone]

## 2013-05-19 ENCOUNTER — Encounter: Payer: Self-pay | Admitting: Internal Medicine

## 2013-05-20 ENCOUNTER — Encounter: Payer: Self-pay | Admitting: Internal Medicine

## 2013-06-17 ENCOUNTER — Encounter: Payer: Self-pay | Admitting: Internal Medicine

## 2013-06-21 ENCOUNTER — Telehealth: Payer: Self-pay | Admitting: *Deleted

## 2013-06-21 ENCOUNTER — Ambulatory Visit (INDEPENDENT_AMBULATORY_CARE_PROVIDER_SITE_OTHER): Payer: 59 | Admitting: Internal Medicine

## 2013-06-21 ENCOUNTER — Encounter: Payer: Self-pay | Admitting: Internal Medicine

## 2013-06-21 VITALS — BP 104/70 | HR 72 | Ht 70.5 in | Wt 185.0 lb

## 2013-06-21 DIAGNOSIS — R059 Cough, unspecified: Secondary | ICD-10-CM

## 2013-06-21 DIAGNOSIS — K219 Gastro-esophageal reflux disease without esophagitis: Secondary | ICD-10-CM

## 2013-06-21 DIAGNOSIS — F17200 Nicotine dependence, unspecified, uncomplicated: Secondary | ICD-10-CM

## 2013-06-21 DIAGNOSIS — M6289 Other specified disorders of muscle: Secondary | ICD-10-CM

## 2013-06-21 DIAGNOSIS — N8184 Pelvic muscle wasting: Secondary | ICD-10-CM

## 2013-06-21 DIAGNOSIS — R05 Cough: Secondary | ICD-10-CM

## 2013-06-21 DIAGNOSIS — R1013 Epigastric pain: Secondary | ICD-10-CM

## 2013-06-21 MED ORDER — PANTOPRAZOLE SODIUM 40 MG PO TBEC: 40.0000 mg | DELAYED_RELEASE_TABLET | Freq: Every day | ORAL | Status: DC

## 2013-06-21 NOTE — Telephone Encounter (Signed)
I have spoken to patient to advise that she has been scheduled for an appointment with Ileana Roup, PT at [facility] for evaluation of pelvic floor dysfunction (per Dr Vena Rua recommendations). She is scheduled for 06/29/13 @ 12:00 pm. Patient verbalizes understanding of this and will call our office with any further questions.

## 2013-06-21 NOTE — Patient Instructions (Signed)
You have been scheduled for an endoscopy with propofol. Please follow written instructions given to you at your visit today. If you use inhalers (even only as needed), please bring them with you on the day of your procedure. Your physician has requested that you go to www.startemmi.com and enter the access code given to you at your visit today. This web site gives a general overview about your procedure. However, you should still follow specific instructions given to you by our office regarding your preparation for the procedure.  We have sent the following medications to your pharmacy for you to pick up at your convenience: pantoprazole 40 mg   You have been scheduled for a Barium Swallow at [facility] (1st floor of the hospital) on 06/24/2013 at 11:00am. Please arrive 15 minutes prior to your appointment for registration. Make certain not to have anything to eat or drink 6 hours prior to your test. If you need to reschedule for any reason, please contact radiology at [phone] to do so. __________________________________________________________________ A barium swallow is an examination that concentrates on views of the esophagus. This tends to be a double contrast exam (barium and two liquids which, when combined, create a gas to distend the wall of the oesophagus) or single contrast (non-ionic iodine based). The study is usually tailored to your symptoms so a good history is essential. Attention is paid during the study to the form, structure and configuration of the esophagus, looking for functional disorders (such as aspiration, dysphagia, achalasia, motility and reflux) EXAMINATION You may be asked to change into a gown, depending on the type of swallow being performed. A radiologist and radiographer will perform the procedure. The radiologist will advise you of the type of contrast selected for your procedure and direct you during the exam. You will be asked to stand, sit or lie in  several different positions and to hold a small amount of fluid in your mouth before being asked to swallow while the imaging is performed .In some instances you may be asked to swallow barium coated marshmallows to assess the motility of a solid food bolus. The exam can be recorded as a digital or video fluoroscopy procedure. POST PROCEDURE It will take 1-2 days for the barium to pass through your system. To facilitate this, it is important, unless otherwise directed, to increase your fluids for the next 24-48hrs and to resume your normal diet.  This test typically takes about 30 minutes to perform. __________________________________________________________________________________

## 2013-06-21 NOTE — Progress Notes (Signed)
Patient ID: Candice Pratt, female   DOB: [birth year], 48 y.o.   MRN: [id number] HPI: Candice Pratt is a 48 yo female with little PMH who is seen in consultation at the request of Dr. Sandford Craze to evaluate reflux, hoarseness, cough and epigastric pain. The patient is here alone today. She reports ongoing issues with chronic cough and also tingling and burning in her throat which triggers her coughing. She also reports daily issues with belching associated with coughing which can trigger vomiting. She reports she's never been able to "burp" as normal people can often she'll have pressure buildup in her epigastrium which can then trigger coughing and at times vomiting. She's also had more recent development of heartburn occurring on 3 separate occasions over the last month. She works in the [place] at [employer] and was told she needed to see GI. She was also advised to start PPI and she started omeprazole 40 mg daily. She's been using this for 2-3 weeks and has not had recurrent heartburn. She's also propping the head of her bed. She denies ever history of dysphagia or odynophagia. She does feel like she aspirates at times when she is sleeping, and also when she is drinking. She endorses a hoarseness which has developed over time. Separate from the above she reports epigastric pain occurring after larger meals. This feels like someone "punched me in the stomach". She denies nausea and vomiting outside of her coughing as described above. Bowel movements have been regular without blood in her stool or melena. For the past 10 years she's had the sensation of incomplete evacuation and at times has to push on her perineum to help completely evacuate. She denies a change in stool caliber and denies perianal or rectal pain. No family history of colon polyps, colon cancer, or IBD. She does smoke cigarettes approximately 8-10 cigarettes a day on average.  History reviewed. No pertinent past medical history.  Past Surgical  History  Procedure Laterality Date  . Knee surgery      Current Outpatient Prescriptions  Medication Sig Dispense Refill  . levonorgestrel (MIRENA) 20 MCG/24HR IUD 1 each by Intrauterine route once.      . mupirocin nasal ointment (BACTROBAN) 2 % Place into the nose 2 (two) times daily. Use one-half of tube in each nostril twice daily for five (5) days. After application, press sides of nose together and gently massage.  10 g  0  . pantoprazole (PROTONIX) 40 MG tablet Take 1 tablet (40 mg total) by mouth daily.  90 tablet  3   No current facility-administered medications for this visit.    No Known Allergies  Family History  Problem Relation Age of Onset  . Diabetes Other   . Cancer Other     History  Substance Use Topics  . Smoking status: Current Every Day Smoker -- 0.50 packs/day for 20 years    Types: Cigarettes  . Smokeless tobacco: Never Used  . Alcohol Use: 0.6 oz/week    1 Glasses of wine per week    ROS: As per history of present illness, otherwise negative  BP 104/70  Pulse 72  Ht 5' 10.5" (1.791 m)  Wt 185 lb (83.915 kg)  BMI 26.16 kg/m2 Constitutional: Well-developed and well-nourished. No distress. HEENT: Normocephalic and atraumatic. Oropharynx is clear and moist. No oropharyngeal exudate. Conjunctivae are normal.  No scleral icterus. Neck: Neck supple. Trachea midline. Cardiovascular: Normal rate, regular rhythm and intact distal pulses. No M/R/G Pulmonary/chest: Effort normal and breath sounds normal. No  wheezing, rales or rhonchi. Abdominal: Soft, nontender, nondistended. Bowel sounds active throughout. There are no masses palpable. No hepatosplenomegaly. Extremities: no clubbing, cyanosis, or edema Lymphadenopathy: No cervical adenopathy noted. Neurological: Alert and oriented to person place and time. Skin: Skin is warm and dry. No rashes noted. Psychiatric: Normal mood and affect. Behavior is normal.   ASSESSMENT/PLAN: 48 yo female with little  PMH who is seen in consultation at the request of Dr. Sandford Craze to evaluate reflux, hoarseness, cough and epigastric pain.   1.  GERD/chronic cough/hoarseness/epigastric pain -- a large part of her symptoms seem GERD related, however the epigastric pain is not consistent with GERD. She has been on PPI for 3 weeks with resolution of heartburn but her other symptoms have persisted. With this in mind have recommended a barium swallow with tablet to assess esophageal motility. After this is performed we will proceed upper endoscopy for further evaluation and to rule out ulcer disease and gastritis. She will continue PPI but changed pantoprazole 40 mg daily due to the expense. We discussed the test today including the risks and benefits and she is agreeable to proceed.  GERD diet until EGD. Tobacco cessation encouraged as tobacco can worsen reflux and cough  2.  Incomplete evacuation -- likely pelvic floor dysfunction. She will be referred to PT at [facility] for further evaluation of pelvic floor dysfunction  3.  CRC screening -- average risk, colonoscopy recommended at age 45

## 2013-06-23 ENCOUNTER — Encounter (HOSPITAL_COMMUNITY): Payer: Self-pay | Admitting: Emergency Medicine

## 2013-06-23 ENCOUNTER — Emergency Department (HOSPITAL_COMMUNITY): Payer: 59

## 2013-06-23 ENCOUNTER — Emergency Department (HOSPITAL_COMMUNITY)
Admission: EM | Admit: 2013-06-23 | Discharge: 2013-06-23 | Disposition: A | Discharge status: Home / Self Care | Payer: 59 | Attending: Emergency Medicine | Admitting: Emergency Medicine

## 2013-06-23 DIAGNOSIS — S0993XA Unspecified injury of face, initial encounter: Secondary | ICD-10-CM | POA: Insufficient documentation

## 2013-06-23 DIAGNOSIS — F17200 Nicotine dependence, unspecified, uncomplicated: Secondary | ICD-10-CM | POA: Diagnosis not present

## 2013-06-23 DIAGNOSIS — Z792 Long term (current) use of antibiotics: Secondary | ICD-10-CM | POA: Insufficient documentation

## 2013-06-23 DIAGNOSIS — Y9389 Activity, other specified: Secondary | ICD-10-CM | POA: Diagnosis not present

## 2013-06-23 DIAGNOSIS — S199XXA Unspecified injury of neck, initial encounter: Principal | ICD-10-CM

## 2013-06-23 DIAGNOSIS — Y92410 Unspecified street and highway as the place of occurrence of the external cause: Secondary | ICD-10-CM | POA: Insufficient documentation

## 2013-06-23 DIAGNOSIS — Z79899 Other long term (current) drug therapy: Secondary | ICD-10-CM | POA: Diagnosis not present

## 2013-06-23 DIAGNOSIS — S298XXA Other specified injuries of thorax, initial encounter: Secondary | ICD-10-CM | POA: Diagnosis not present

## 2013-06-23 DIAGNOSIS — IMO0002 Reserved for concepts with insufficient information to code with codable children: Secondary | ICD-10-CM | POA: Diagnosis not present

## 2013-06-23 DIAGNOSIS — R109 Unspecified abdominal pain: Secondary | ICD-10-CM

## 2013-06-23 DIAGNOSIS — S3981XA Other specified injuries of abdomen, initial encounter: Secondary | ICD-10-CM | POA: Diagnosis not present

## 2013-06-23 DIAGNOSIS — Z8719 Personal history of other diseases of the digestive system: Secondary | ICD-10-CM | POA: Diagnosis not present

## 2013-06-23 DIAGNOSIS — M542 Cervicalgia: Secondary | ICD-10-CM

## 2013-06-23 DIAGNOSIS — S0990XA Unspecified injury of head, initial encounter: Secondary | ICD-10-CM | POA: Diagnosis present

## 2013-06-23 DIAGNOSIS — R079 Chest pain, unspecified: Secondary | ICD-10-CM

## 2013-06-23 HISTORY — DX: Diaphragmatic hernia without obstruction or gangrene: K44.9

## 2013-06-23 LAB — CBC WITH DIFFERENTIAL/PLATELET
BASOS PCT: 1 % (ref 0–1)
Basophils Absolute: 0.1 10*3/uL (ref 0.0–0.1)
EOS ABS: 0.2 10*3/uL (ref 0.0–0.7)
Eosinophils Relative: 2 % (ref 0–5)
HCT: 37.8 % (ref 36.0–46.0)
HEMOGLOBIN: 12.4 g/dL (ref 12.0–15.0)
Lymphocytes Relative: 17 % (ref 12–46)
Lymphs Abs: 1.8 10*3/uL (ref 0.7–4.0)
MCH: 30.2 pg (ref 26.0–34.0)
MCHC: 32.8 g/dL (ref 30.0–36.0)
MCV: 92.2 fL (ref 78.0–100.0)
MONOS PCT: 7 % (ref 3–12)
Monocytes Absolute: 0.7 10*3/uL (ref 0.1–1.0)
NEUTROS ABS: 7.9 10*3/uL — AB (ref 1.7–7.7)
NEUTROS PCT: 75 % (ref 43–77)
PLATELETS: 234 10*3/uL (ref 150–400)
RBC: 4.1 MIL/uL (ref 3.87–5.11)
RDW: 13.4 % (ref 11.5–15.5)
WBC: 10.5 10*3/uL (ref 4.0–10.5)

## 2013-06-23 LAB — COMPREHENSIVE METABOLIC PANEL
ALBUMIN: 3.9 g/dL (ref 3.5–5.2)
ALK PHOS: 21 U/L — AB (ref 39–117)
ALT: 14 U/L (ref 0–35)
AST: 13 U/L (ref 0–37)
BUN: 25 mg/dL — AB (ref 6–23)
CHLORIDE: 102 meq/L (ref 96–112)
CO2: 26 mEq/L (ref 19–32)
Calcium: 9.6 mg/dL (ref 8.4–10.5)
Creatinine, Ser: 1.05 mg/dL (ref 0.50–1.10)
GFR calc Af Amer: 73 mL/min — ABNORMAL LOW (ref >90)
GFR calc non Af Amer: 63 mL/min — ABNORMAL LOW (ref >90)
Glucose, Bld: 87 mg/dL (ref 70–99)
POTASSIUM: 3.6 meq/L — AB (ref 3.7–5.3)
SODIUM: 140 meq/L (ref 137–147)
TOTAL PROTEIN: 7.1 g/dL (ref 6.0–8.3)

## 2013-06-23 LAB — I-STAT CG4 LACTIC ACID, ED: Lactic Acid, Venous: 0.85 mmol/L (ref 0.5–2.2)

## 2013-06-23 MED ORDER — ONDANSETRON HCL 4 MG/2ML IJ SOLN
4.0000 mg | Freq: Once | INTRAMUSCULAR | Status: AC
  Administered 2013-06-23: 4 mg via INTRAVENOUS
  Filled 2013-06-23: qty 2

## 2013-06-23 MED ORDER — IOHEXOL 300 MG/ML  SOLN
100.0000 mL | Freq: Once | INTRAMUSCULAR | Status: AC | PRN
  Administered 2013-06-23: 100 mL via INTRAVENOUS

## 2013-06-23 MED ORDER — MORPHINE SULFATE 4 MG/ML IJ SOLN
4.0000 mg | Freq: Once | INTRAMUSCULAR | Status: AC
  Administered 2013-06-23: 4 mg via INTRAVENOUS
  Filled 2013-06-23: qty 1

## 2013-06-23 MED ORDER — SODIUM CHLORIDE 0.9 % IV BOLUS (SEPSIS)
1000.0000 mL | Freq: Once | INTRAVENOUS | Status: AC
  Administered 2013-06-23: 1000 mL via INTRAVENOUS

## 2013-06-23 MED ORDER — HYDROCODONE-ACETAMINOPHEN 5-325 MG PO TABS: 2.0000 | ORAL_TABLET | ORAL | Status: DC | PRN

## 2013-06-23 MED ORDER — NAPROXEN 500 MG PO TABS: 500.0000 mg | ORAL_TABLET | Freq: Two times a day (BID) | ORAL | Status: DC | PRN
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
## 2013-06-23 NOTE — ED Notes (Signed)
Dr. Tomi Bamberger took C-collar off patient. Pt A&OX4, ambulatory at discharge with slow steady gait, no acute distress noted.
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
## 2013-06-23 NOTE — ED Notes (Signed)
Patient attempted to urinate but was unsuccessful.  
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
## 2013-06-24 ENCOUNTER — Ambulatory Visit (HOSPITAL_COMMUNITY): Payer: Self-pay

## 2013-06-29 ENCOUNTER — Ambulatory Visit (HOSPITAL_COMMUNITY): Admission: RE | Admit: 2013-06-29 | Payer: 59 | Source: Ambulatory Visit

## 2013-07-05 ENCOUNTER — Ambulatory Visit (HOSPITAL_COMMUNITY)
Admission: RE | Admit: 2013-07-05 | Discharge: 2013-07-05 | Disposition: A | Discharge status: Home / Self Care | Payer: 59 | Source: Ambulatory Visit | Attending: Internal Medicine | Admitting: Internal Medicine

## 2013-07-05 DIAGNOSIS — K219 Gastro-esophageal reflux disease without esophagitis: Secondary | ICD-10-CM | POA: Insufficient documentation

## 2013-07-08 ENCOUNTER — Ambulatory Visit: Payer: Self-pay | Admitting: Internal Medicine

## 2013-07-20 ENCOUNTER — Encounter: Payer: Self-pay | Admitting: Internal Medicine

## 2013-07-20 ENCOUNTER — Ambulatory Visit (AMBULATORY_SURGERY_CENTER): Payer: 59 | Admitting: Internal Medicine

## 2013-07-20 VITALS — BP 131/65 | HR 65 | Temp 97.6°F | Resp 13 | Ht 70.0 in | Wt 185.0 lb

## 2013-07-20 DIAGNOSIS — R05 Cough: Secondary | ICD-10-CM

## 2013-07-20 DIAGNOSIS — R1013 Epigastric pain: Secondary | ICD-10-CM

## 2013-07-20 DIAGNOSIS — K2940 Chronic atrophic gastritis without bleeding: Secondary | ICD-10-CM

## 2013-07-20 DIAGNOSIS — R059 Cough, unspecified: Secondary | ICD-10-CM

## 2013-07-20 DIAGNOSIS — K219 Gastro-esophageal reflux disease without esophagitis: Secondary | ICD-10-CM

## 2013-07-20 MED ORDER — SODIUM CHLORIDE 0.9 % IV SOLN: 500.0000 mL | INTRAVENOUS | Status: DC

## 2013-07-20 NOTE — Op Note (Signed)
[place]  [address] [place], [id number]   ENDOSCOPY PROCEDURE REPORT  PATIENT: Candice Pratt, Candice Pratt  MR#: [id number] BIRTHDATE: [birth year] , 47  yrs. old GENDER: Female ENDOSCOPIST: Jerene Bears, MD REFERRED BY:  Jody Bovard-Stuckert PROCEDURE DATE:  07/20/2013 PROCEDURE:  EGD w/ biopsy ASA CLASS:     Class II INDICATIONS:  history of GERD.   Epigastric pain. MEDICATIONS: MAC sedation, administered by CRNA and propofol (Diprivan) 200mg  IV TOPICAL ANESTHETIC: none  DESCRIPTION OF PROCEDURE: After the risks benefits and alternatives of the procedure were thoroughly explained, informed consent was obtained.  The LB [id number] endoscope was introduced through the mouth and advanced to the second portion of the duodenum. Without limitations.  The instrument was slowly withdrawn as the mucosa was fully examined.  ESOPHAGUS: Mild esophagitis was found in the lower third of the esophagus, probably reflux-induced.  Multiple biopsies were taken in the distal and mid esophagus to rule out eosinophilic esophagitis.  STOMACH: Mild acute gastritis (inflammation) characterized by erythema and scattered erosion was found in the gastric antrum. Biopsies were taken in the antrum and angularis.   Normal proximal stomach.  DUODENUM: Mild duodenal inflammation was found in the duodenal bulb. The duodenal mucosa showed no abnormalities in the 2nd part of the duodenum.  Retroflexed views revealed no abnormalities.     The scope was then withdrawn from the patient and the procedure completed.  COMPLICATIONS: There were no complications.  ENDOSCOPIC IMPRESSION: 1.   Mild esophagitis consistent with reflux esophagitis in the lower third of the esophagus; multiple biopsies were taken in the distal and mid esophagus to rule out eosinophilic esophagitis 2.   Acute gastritis (inflammation) was found in the gastric antrum; biopsies were taken in the antrum and  angularis 3.   Duodenal inflammation was found in the duodenal bulb 4.   The duodenal mucosa showed no abnormalities in the 2nd part of the duodenum     RECOMMENDATIONS: 1.  Await biopsy results 2.  Follow-up of helicobacter pylori status, treat if indicated 3.  Continue taking your PPI (antiacid medicine) once daily.  It is best to be taken 20-30 minutes prior to breakfast meal.  eSigned:  Jerene Bears, MD 07/20/2013 1:56 PM      CC:The Patient; Jeral Fruit Bovard-Stuckert  PATIENT NAME:  [name], [name] MR#: [id number]

## 2013-07-20 NOTE — Progress Notes (Signed)
Pt stable to RR coughing awake

## 2013-07-20 NOTE — Progress Notes (Signed)
Called to room to assist during endoscopic procedure.  Patient ID and intended procedure confirmed with present staff. Received instructions for my participation in the procedure from the performing physician.  

## 2013-07-20 NOTE — Patient Instructions (Addendum)

## 2013-07-21 ENCOUNTER — Telehealth: Payer: Self-pay | Admitting: *Deleted

## 2013-07-21 NOTE — Telephone Encounter (Signed)
  Follow up Call-  Call back number 07/20/2013  Post procedure Call Back phone  # [phone]  Permission to leave phone message Yes     Patient questions:  Do you have a fever, pain , or abdominal swelling? no Pain Score  0 *  Have you tolerated food without any problems? yes  Have you been able to return to your normal activities? yes  Do you have any questions about your discharge instructions: Diet   no Medications  no Follow up visit  no  Do you have questions or concerns about your Care? no  Actions: * If pain score is 4 or above: No action needed, pain <4.

## 2013-07-28 ENCOUNTER — Encounter: Payer: Self-pay | Admitting: Internal Medicine

## 2013-07-29 ENCOUNTER — Encounter (HOSPITAL_COMMUNITY): Payer: Self-pay | Admitting: Emergency Medicine

## 2013-07-29 ENCOUNTER — Emergency Department (INDEPENDENT_AMBULATORY_CARE_PROVIDER_SITE_OTHER)
Admission: EM | Admit: 2013-07-29 | Discharge: 2013-07-29 | Disposition: A | Discharge status: Home / Self Care | Payer: Self-pay | Source: Home / Self Care | Attending: Family Medicine | Admitting: Family Medicine

## 2013-07-29 DIAGNOSIS — Q762 Congenital spondylolisthesis: Secondary | ICD-10-CM

## 2013-07-29 DIAGNOSIS — M545 Low back pain, unspecified: Secondary | ICD-10-CM

## 2013-07-29 DIAGNOSIS — M4310 Spondylolisthesis, site unspecified: Secondary | ICD-10-CM

## 2013-07-29 LAB — POCT PREGNANCY, URINE: Preg Test, Ur: NEGATIVE

## 2013-07-29 LAB — POCT URINALYSIS DIP (DEVICE)
BILIRUBIN URINE: NEGATIVE
Glucose, UA: NEGATIVE mg/dL
Ketones, ur: NEGATIVE mg/dL
Nitrite: NEGATIVE
Protein, ur: NEGATIVE mg/dL
SPECIFIC GRAVITY, URINE: 1.025 (ref 1.005–1.030)
Urobilinogen, UA: 0.2 mg/dL (ref 0.0–1.0)
pH: 5.5 (ref 5.0–8.0)

## 2013-07-29 MED ORDER — METHOCARBAMOL 750 MG PO TABS: 750.0000 mg | ORAL_TABLET | Freq: Three times a day (TID) | ORAL | Status: DC | PRN

## 2013-07-29 NOTE — Discharge Instructions (Signed)
Thank you for coming in today. Follow up with Dr. Tamala Julian.  Attend physical therpay.  Come back or go to the emergency room if you notice new weakness new numbness problems walking or bowel or bladder problems.

## 2013-07-29 NOTE — ED Provider Notes (Addendum)
Candice Pratt is a 48 y.o. female who presents to Urgent Care today for back pain. Patient has a history of moderate chronic intermittent low back pain resulting from grade 2 anterolisthesis at L5/S1 and right L5 neural foramen stenosis.  She was feeling well until this morning when she suffered significant back pain after bending down to pick up an object. She notes significant bilateral low back pain  radiating to the hips. He denies any weakness or numbness bowel bladder dysfunction or difficulty walking. The pain is moderate to severe and worse with activity. She's tried taking some naproxen which helps some. She feels well otherwise. She currently is getting chiropractic care for her back pain but says that it doesn't help very much. Patient denies any dysuria or urinary frequency or urgency. She currently is experiencing some vaginal spotting.   Past Medical History  Diagnosis Date  . Hiatal hernia    History  Substance Use Topics  . Smoking status: Current Every Day Smoker -- 0.50 packs/day for 20 years    Types: Cigarettes  . Smokeless tobacco: Never Used  . Alcohol Use: 0.6 oz/week    1 Glasses of wine per week   ROS as above Medications: No current facility-administered medications for this encounter.   Current Outpatient Prescriptions  Medication Sig Dispense Refill  . HYDROcodone-acetaminophen (NORCO) 5-325 MG per tablet Take 2 tablets by mouth every 4 (four) hours as needed for severe pain.  10 tablet  0  . levonorgestrel (MIRENA) 20 MCG/24HR IUD 1 each by Intrauterine route once.      . methocarbamol (ROBAXIN-750) 750 MG tablet Take 1 tablet (750 mg total) by mouth every 8 (eight) hours as needed for muscle spasms.  60 tablet  1  . naproxen (NAPROSYN) 500 MG tablet Take 1 tablet (500 mg total) by mouth 2 (two) times daily as needed for moderate pain.  30 tablet  0  . pantoprazole (PROTONIX) 40 MG tablet Take 1 tablet (40 mg total) by mouth daily.  90 tablet  3    Exam:   BP 102/86  Pulse 84  Temp(Src) 98.6 F (37 C) (Oral)  Resp 14  SpO2 100% Gen: Well NAD HEENT: EOMI,  MMM Lungs: Normal work of breathing. CTABL Heart: RRR no MRG Abd: NABS, Soft. NT, ND Exts: Brisk capillary refill, warm and well perfused.  Back: Nontender to spinal midline. Tender palpation bilateral lumbar paraspinals and SI joint.  Negative straight leg raise test bilaterally. Positive pretzel stretch bilaterally. Lower extremity strength is intact bilaterally. Sensation is intact throughout. Reflexes are slightly diminished but equal bilateral knees and ankles. Normal gait Lumbar range of motion limited by pain  Results for orders placed during the hospital encounter of 07/29/13 (from the past 24 hour(s))  POCT URINALYSIS DIP (DEVICE)     Status: Abnormal   Collection Time    07/29/13  8:30 AM      Result Value Ref Range   Glucose, UA NEGATIVE  NEGATIVE mg/dL   Bilirubin Urine NEGATIVE  NEGATIVE   Ketones, ur NEGATIVE  NEGATIVE mg/dL   Specific Gravity, Urine 1.025  1.005 - 1.030   Hgb urine dipstick MODERATE (*) NEGATIVE   pH 5.5  5.0 - 8.0   Protein, ur NEGATIVE  NEGATIVE mg/dL   Urobilinogen, UA 0.2  0.0 - 1.0 mg/dL   Nitrite NEGATIVE  NEGATIVE   Leukocytes, UA LARGE (*) NEGATIVE  POCT PREGNANCY, URINE     Status: None   Collection Time  07/29/13  8:32 AM      Result Value Ref Range   Preg Test, Ur NEGATIVE  NEGATIVE   No results found.  Assessment and Plan: 48 y.o. female with lumbago. Likely due to myofascial disruption. This is in the setting of some structural changes in her back.  She would benefit significantly from physical therapy to work on core strength. Will refer directly to physical therapy. Additionally recommended she followup with Dr. Anola Gurney medicine for continued management of her moderate intermittent back pain.  Unclear about abnormal urinalysis. Suspect is contaminated. Urine culture pending. Watchful waiting.  Discussed warning  signs or symptoms. Please see discharge instructions. Patient expresses understanding.    Gregor Hams, MD 07/29/13 Berks Corey, MD 07/29/13 1020
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
## 2014-03-01 ENCOUNTER — Emergency Department (HOSPITAL_COMMUNITY)
Admission: EM | Admit: 2014-03-01 | Discharge: 2014-03-01 | Disposition: A | Discharge status: Home / Self Care | Payer: 59 | Source: Home / Self Care | Attending: Family Medicine | Admitting: Family Medicine

## 2014-03-01 ENCOUNTER — Encounter (HOSPITAL_COMMUNITY): Payer: Self-pay | Admitting: *Deleted

## 2014-03-01 DIAGNOSIS — S161XXA Strain of muscle, fascia and tendon at neck level, initial encounter: Secondary | ICD-10-CM | POA: Diagnosis not present

## 2014-03-01 MED ORDER — METHOCARBAMOL 750 MG PO TABS: 750.0000 mg | ORAL_TABLET | Freq: Three times a day (TID) | ORAL | Status: DC | PRN

## 2014-03-01 MED ORDER — METHOCARBAMOL 500 MG PO TABS: 500.0000 mg | ORAL_TABLET | Freq: Three times a day (TID) | ORAL | Status: DC

## 2014-03-01 NOTE — Discharge Instructions (Signed)
Heat and medicine as needed, return or see your doctor as needed.

## 2014-03-01 NOTE — ED Notes (Signed)
Pt  Fell    X  2   -  This  Am       -  She  reports  Felled  On  [employer]       She  Reports  Pain     l  Knee  Lower back      And  Neck      Pt  ambulated  To  Room       -  She  Is  Sitting upright on  The  Exam table     -    He  Is  Speaking in  Complete  sentances

## 2014-03-01 NOTE — ED Provider Notes (Signed)
CSN: [id number]     Arrival date & time 03/01/14  1044 History   First MD Initiated Contact with Patient 03/01/14 1058     Chief Complaint  Patient presents with  . Fall   (Consider location/radiation/quality/duration/timing/severity/associated sxs/prior Treatment) Patient is a 49 y.o. female presenting with fall. The history is provided by the patient.  Fall This is a new problem. The current episode started 3 to 5 hours ago (slipped and fell this am x 2 on ice with diffuse soreness , no acute injury or pain.). The problem has been gradually worsening. Pertinent negatives include no chest pain and no abdominal pain. The symptoms are aggravated by twisting (does have chronic back and knee pain from old injuries.).    Past Medical History  Diagnosis Date  . Hiatal hernia    Past Surgical History  Procedure Laterality Date  . Knee surgery     Family History  Problem Relation Age of Onset  . Diabetes Other   . Cancer Other    History  Substance Use Topics  . Smoking status: Current Every Day Smoker -- 0.50 packs/day for 20 years    Types: Cigarettes  . Smokeless tobacco: Never Used  . Alcohol Use: 0.6 oz/week    1 Glasses of wine per week   OB History    No data available     Review of Systems  Constitutional: Negative.   Cardiovascular: Negative for chest pain.  Gastrointestinal: Negative.  Negative for abdominal pain.  Musculoskeletal: Positive for back pain, arthralgias and neck pain.  Skin: Negative.     Allergies  Review of patient's allergies indicates no known allergies.  Home Medications   Prior to Admission medications   Medication Sig Start Date End Date Taking? Authorizing Provider  HYDROcodone-acetaminophen (NORCO) 5-325 MG per tablet Take 2 tablets by mouth every 4 (four) hours as needed for severe pain. 06/23/13   Corlis Leak, MD  levonorgestrel (MIRENA) 20 MCG/24HR IUD 1 each by Intrauterine route once.    Historical Provider, MD  methocarbamol  (ROBAXIN) 500 MG tablet Take 1 tablet (500 mg total) by mouth 3 (three) times daily. 03/01/14   Billy Fischer, MD  methocarbamol (ROBAXIN-750) 750 MG tablet Take 1 tablet (750 mg total) by mouth every 8 (eight) hours as needed for muscle spasms. 03/01/14   Billy Fischer, MD  naproxen (NAPROSYN) 500 MG tablet Take 1 tablet (500 mg total) by mouth 2 (two) times daily as needed for moderate pain. 06/23/13   Corlis Leak, MD  pantoprazole (PROTONIX) 40 MG tablet Take 1 tablet (40 mg total) by mouth daily. 06/21/13   Jerene Bears, MD   BP 110/71 mmHg  Pulse 89  Temp(Src) 98.1 F (36.7 C) (Oral)  Resp 16  SpO2 97%  LMP 02/22/2014 Physical Exam  Constitutional: She is oriented to person, place, and time. She appears well-developed and well-nourished.  HENT:  Head: Normocephalic and atraumatic.  Eyes: Pupils are equal, round, and reactive to light.  Neck: Muscular tenderness present. No spinous process tenderness present. No Brudzinski's sign and no Kernig's sign noted.    Musculoskeletal: Normal range of motion. She exhibits tenderness.  Lymphadenopathy:    She has no cervical adenopathy.  Neurological: She is alert and oriented to person, place, and time. No cranial nerve deficit.  Skin: Skin is warm and dry.  Nursing note and vitals reviewed.   ED Course  Procedures (including critical care time) Labs Review Labs Reviewed - No data to display  Imaging Review No results found.   MDM   1. Cervical myofascial strain, initial encounter        Billy Fischer, MD 03/01/14 1124

## 2014-10-13 ENCOUNTER — Ambulatory Visit (INDEPENDENT_AMBULATORY_CARE_PROVIDER_SITE_OTHER): Payer: 59 | Admitting: Family Medicine

## 2014-10-13 VITALS — BP 124/70 | HR 70 | Temp 98.1°F | Resp 16 | Ht 70.0 in | Wt 173.0 lb

## 2014-10-13 DIAGNOSIS — J029 Acute pharyngitis, unspecified: Secondary | ICD-10-CM

## 2014-10-13 DIAGNOSIS — S80819A Abrasion, unspecified lower leg, initial encounter: Secondary | ICD-10-CM

## 2014-10-13 DIAGNOSIS — Z2089 Contact with and (suspected) exposure to other communicable diseases: Secondary | ICD-10-CM

## 2014-10-13 DIAGNOSIS — S80812A Abrasion, left lower leg, initial encounter: Secondary | ICD-10-CM | POA: Diagnosis not present

## 2014-10-13 DIAGNOSIS — Z20818 Contact with and (suspected) exposure to other bacterial communicable diseases: Secondary | ICD-10-CM

## 2014-10-13 DIAGNOSIS — L089 Local infection of the skin and subcutaneous tissue, unspecified: Secondary | ICD-10-CM | POA: Diagnosis not present

## 2014-10-13 LAB — POCT RAPID STREP A (OFFICE): Rapid Strep A Screen: NEGATIVE

## 2014-10-13 MED ORDER — AMOXICILLIN 500 MG PO CAPS: 500.0000 mg | ORAL_CAPSULE | Freq: Three times a day (TID) | ORAL | Status: DC

## 2014-10-13 NOTE — Patient Instructions (Addendum)
As of now, your sore throat does not appear to be strep throat. However if the culture does come back positive, fever or you notice discolored substance on the back the tonsils with sore lymph nodes in your neck, go ahead and start the amoxicillin that we printed. 4 your wound on the leg, continue soap and water cleansing twice per day, stop Neosporin as it may have caused some irritation along with the other bandage, and use Bactroban ointment if the blister appearing area is not improving the next few days. Return to the clinic or go to the nearest emergency room if any of your symptoms worsen or new symptoms occur.  [place]!  Sore Throat A sore throat is pain, burning, irritation, or scratchiness of the throat. There is often pain or tenderness when swallowing or talking. A sore throat may be accompanied by other symptoms, such as coughing, sneezing, fever, and swollen neck glands. A sore throat is often the first sign of another sickness, such as a cold, flu, strep throat, or mononucleosis (commonly known as mono). Most sore throats go away without medical treatment. CAUSES  The most common causes of a sore throat include:  A viral infection, such as a cold, flu, or mono.  A bacterial infection, such as strep throat, tonsillitis, or whooping cough.  Seasonal allergies.  Dryness in the air.  Irritants, such as smoke or pollution.  Gastroesophageal reflux disease (GERD). HOME CARE INSTRUCTIONS   Only take over-the-counter medicines as directed by your caregiver.  Drink enough fluids to keep your urine clear or pale yellow.  Rest as needed.  Try using throat sprays, lozenges, or sucking on hard candy to ease any pain (if older than 4 years or as directed).  Sip warm liquids, such as broth, herbal tea, or warm water with honey to relieve pain temporarily. You may also eat or drink cold or frozen liquids such as frozen ice pops.  Gargle with salt water (mix 1 tsp salt with 8 oz  of water).  Do not smoke and avoid secondhand smoke.  Put a cool-mist humidifier in your bedroom at night to moisten the air. You can also turn on a hot shower and sit in the bathroom with the door closed for 5-10 minutes. SEEK IMMEDIATE MEDICAL CARE IF:  You have difficulty breathing.  You are unable to swallow fluids, soft foods, or your saliva.  You have increased swelling in the throat.  Your sore throat does not get better in 7 days.  You have nausea and vomiting.  You have a fever or persistent symptoms for more than 2-3 days.  You have a fever and your symptoms suddenly get worse. MAKE SURE YOU:   Understand these instructions.  Will watch your condition.  Will get help right away if you are not doing well or get worse. Document Released: 02/29/2004 Document Revised: 01/08/2012 Document Reviewed: 09/29/2011 [facility] Patient Information 2015 [place], [place]. This information is not intended to replace advice given to you by your health care provider. Make sure you discuss any questions you have with your health care provider.    Abrasion An abrasion is a cut or scrape of the skin. Abrasions do not extend through all layers of the skin and most heal within 10 days. It is important to care for your abrasion properly to prevent infection. CAUSES  Most abrasions are caused by falling on, or gliding across, the ground or other surface. When your skin rubs on something, the outer and inner  layer of skin rubs off, causing an abrasion. DIAGNOSIS  Your caregiver will be able to diagnose an abrasion during a physical exam.  TREATMENT  Your treatment depends on how large and deep the abrasion is. Generally, your abrasion will be cleaned with water and a mild soap to remove any dirt or debris. An antibiotic ointment may be put over the abrasion to prevent an infection. A bandage (dressing) may be wrapped around the abrasion to keep it from getting dirty.  You may need a tetanus shot  if:  You cannot remember when you had your last tetanus shot.  You have never had a tetanus shot.  The injury broke your skin. If you get a tetanus shot, your arm may swell, get red, and feel warm to the touch. This is common and not a problem. If you need a tetanus shot and you choose not to have one, there is a rare chance of getting tetanus. Sickness from tetanus can be serious.  HOME CARE INSTRUCTIONS   If a dressing was applied, change it at least once a day or as directed by your caregiver. If the bandage sticks, soak it off with warm water.   Wash the area with water and a mild soap to remove all the ointment 2 times a day. Rinse off the soap and pat the area dry with a clean towel.   Reapply any ointment as directed by your caregiver. This will help prevent infection and keep the bandage from sticking. Use gauze over the wound and under the dressing to help keep the bandage from sticking.   Change your dressing right away if it becomes wet or dirty.   Only take over-the-counter or prescription medicines for pain, discomfort, or fever as directed by your caregiver.   Follow up with your caregiver within 24-48 hours for a wound check, or as directed. If you were not given a wound-check appointment, look closely at your abrasion for redness, swelling, or pus. These are signs of infection. SEEK IMMEDIATE MEDICAL CARE IF:   You have increasing pain in the wound.   You have redness, swelling, or tenderness around the wound.   You have pus coming from the wound.   You have a fever or persistent symptoms for more than 2-3 days.  You have a fever and your symptoms suddenly get worse.  You have a bad smell coming from the wound or dressing.  MAKE SURE YOU:   Understand these instructions.  Will watch your condition.  Will get help right away if you are not doing well or get worse. Document Released: 10/31/2004 Document Revised: 01/08/2012 Document Reviewed:  12/25/2010 [facility] Patient Information 2015 [place], [place]. This information is not intended to replace advice given to you by your health care provider. Make sure you discuss any questions you have with your health care provider.

## 2014-10-13 NOTE — Progress Notes (Addendum)
Subjective:  This chart was scribed for Candice Parish, MD by Leandra Kern, Medical Scribe. This patient was seen in Room 5 and the patient's care was started at 11:50 AM.   Patient ID: Candice Pratt, female    DOB: 1965-06-06, 49 y.o.   MRN: [id number]  Chief Complaint  Patient presents with  . Sore Throat    Onset yesterday, exposed to strep  . Scraped Left shin    1 month ago, not healing as well as to be expected   HPI HPI Comments: Candice Pratt is a 49 y.o. female who presents to [facility] complaining of the following:   Sore throat: Pt reports that the symptoms started yesterday. She reports symptoms of cough, more severe than normal. Pt denies fever, congestion, or drainage. Pt reports that she was exposed to strep, she indicates that one of her colleague was tested positive for strep last week.   Wound on left shin: She [place] the area on a rock. She indicates that she part of the area has healed fully, however the other part started to blister, and she started noticing more itchiness and redness to the area with using new bandaging, which she has switched to a new kind since. Pt used hydrocortisone for the itching. Pt reports that she used and Antibiotic ointment, Neosporin accompanied with washing the area with soap and water since the onset of the symptoms.   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   Patient Active Problem List   Diagnosis Date Noted  . 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 TOBACCO ABUSE 03/31/2009  . HEMORRHOIDS, EXTERNAL 03/31/2009  . FATIGUE 03/31/2009   Past Medical History  Diagnosis Date  . Hiatal hernia   . Asthma   . GERD (gastroesophageal reflux disease)    Past Surgical History  Procedure Laterality Date  . Knee surgery     No Known Allergies Prior to Admission medications   Medication Sig Start Date End Date Taking? Authorizing Provider  levonorgestrel (MIRENA)  20 MCG/24HR IUD 1 each by Intrauterine route once.   Yes Historical Provider, MD  methocarbamol (ROBAXIN) 500 MG tablet Take 1 tablet (500 mg total) by mouth 3 (three) times daily. 03/01/14  Yes Billy Fischer, MD  methocarbamol (ROBAXIN-750) 750 MG tablet Take 1 tablet (750 mg total) by mouth every 8 (eight) hours as needed for muscle spasms. 03/01/14  Yes Billy Fischer, MD  naproxen sodium (ANAPROX) 220 MG tablet Take 220 mg by mouth 1 day or 1 dose.   Yes Historical Provider, MD   Social History   Social History  . Marital Status: Divorced    Spouse Name: N/A  . Number of Children: 3  . Years of Education: N/A   Occupational History  .  [employer]   Social History Main Topics  . Smoking status: Current Every Day Smoker -- 0.50 packs/day for 20 years    Types: Cigarettes  . Smokeless tobacco: Never Used  . Alcohol Use: 0.6 oz/week    1 Glasses of wine per week  . Drug Use: No  . Sexual Activity: Yes    Birth Control/ Protection: IUD   Other Topics Concern  . Not on file   Social History Narrative    Review of Systems  Constitutional: Negative for fever.  HENT: Positive for sore throat. Negative for congestion.   Respiratory: Positive for cough.  Skin: Positive for wound (left shin).      Objective:   Physical Exam  Constitutional: She is oriented to person, place, and time. She appears well-developed and well-nourished. No distress.  HENT:  Head: Normocephalic and atraumatic.  Mouth/Throat: Posterior oropharyngeal erythema (minimal ) present. No oropharyngeal exudate.  Eyes: EOM are normal. Pupils are equal, round, and reactive to light.  Neck: Neck supple.  Cardiovascular: Normal rate.   Pulmonary/Chest: Effort normal.  Musculoskeletal:  Left anterior shin- there is a 7X2 cm area of faint erythema, few papular areas inferiorly. Healing abrasion centrally. On the upper aspect of the wound there is 1.5X1 cm flattened blistering bulla lesions. No surrounding erythema.     Lymphadenopathy:    She has no cervical adenopathy.  Neurological: She is alert and oriented to person, place, and time. No cranial nerve deficit.  Skin: Skin is warm and dry.  Psychiatric: She has a normal mood and affect. Her behavior is normal.  Nursing note and vitals reviewed.  Filed Vitals:   10/13/14 1118  BP: 124/70  Pulse: 70  Temp: 98.1 F (36.7 C)  TempSrc: Oral  Resp: 16  Height: 5\' 10"  (1.778 m)  Weight: 173 lb (78.472 kg)  SpO2: 97%   Results for orders placed or performed in visit on 10/13/14  POCT rapid strep A  Result Value Ref Range   Rapid Strep A Screen Negative Negative       Assessment & Plan:   Candice Pratt is a 49 y.o. female Sore throat , Exposure to strep throat - Plan: amoxicillin (AMOXIL) 500 MG capsule  -Rapid strep negative, no appreciable lymphadenopathy, doubt strep pharyngitis at this time.  Suspected viral cause, but with sick contacts with strep, will check throat culture, and if this is positive or fever/worsening symptoms can fill amoxicillin. RTC precautions and symptomatic care discussed  Abrasion, leg with infection, unspecified laterality, initial encounter  -Still healing abrasion, with description of symptoms of upper area may have had component of cellulitis. Appears to be improving now. She has Bactroban at home if needed (change to this from Neosporin if not improving as may be having some neomycin irritation, but more likely bandage irritation),  RTC precautions  Meds ordered this encounter  Medications  . naproxen sodium (ANAPROX) 220 MG tablet    Sig: Take 220 mg by mouth 1 day or 1 dose.  Marland Kitchen amoxicillin (AMOXIL) 500 MG capsule    Sig: Take 1 capsule (500 mg total) by mouth 3 (three) times daily.    Dispense:  30 capsule    Refill:  0   Patient Instructions  As of now, your sore throat does not appear to be strep throat. However if the culture does come back positive, fever or you notice discolored substance on  the back the tonsils with sore lymph nodes in your neck, go ahead and start the amoxicillin that we printed. 4 your wound on the leg, continue soap and water cleansing twice per day, stop Neosporin as it may have caused some irritation along with the other bandage, and use Bactroban ointment if the blister appearing area is not improving the next few days. Return to the clinic or go to the nearest emergency room if any of your symptoms worsen or new symptoms occur.  [place]!  Sore Throat A sore throat is pain, burning, irritation, or scratchiness of the throat. There is often pain or tenderness when swallowing or talking. A sore throat may be accompanied by other  symptoms, such as coughing, sneezing, fever, and swollen neck glands. A sore throat is often the first sign of another sickness, such as a cold, flu, strep throat, or mononucleosis (commonly known as mono). Most sore throats go away without medical treatment. CAUSES  The most common causes of a sore throat include:  A viral infection, such as a cold, flu, or mono.  A bacterial infection, such as strep throat, tonsillitis, or whooping cough.  Seasonal allergies.  Dryness in the air.  Irritants, such as smoke or pollution.  Gastroesophageal reflux disease (GERD). HOME CARE INSTRUCTIONS   Only take over-the-counter medicines as directed by your caregiver.  Drink enough fluids to keep your urine clear or pale yellow.  Rest as needed.  Try using throat sprays, lozenges, or sucking on hard candy to ease any pain (if older than 4 years or as directed).  Sip warm liquids, such as broth, herbal tea, or warm water with honey to relieve pain temporarily. You may also eat or drink cold or frozen liquids such as frozen ice pops.  Gargle with salt water (mix 1 tsp salt with 8 oz of water).  Do not smoke and avoid secondhand smoke.  Put a cool-mist humidifier in your bedroom at night to moisten the air. You can also turn on a  hot shower and sit in the bathroom with the door closed for 5-10 minutes. SEEK IMMEDIATE MEDICAL CARE IF:  You have difficulty breathing.  You are unable to swallow fluids, soft foods, or your saliva.  You have increased swelling in the throat.  Your sore throat does not get better in 7 days.  You have nausea and vomiting.  You have a fever or persistent symptoms for more than 2-3 days.  You have a fever and your symptoms suddenly get worse. MAKE SURE YOU:   Understand these instructions.  Will watch your condition.  Will get help right away if you are not doing well or get worse. Document Released: 02/29/2004 Document Revised: 01/08/2012 Document Reviewed: 09/29/2011 [facility] Patient Information 2015 [place], [place]. This information is not intended to replace advice given to you by your health care provider. Make sure you discuss any questions you have with your health care provider.    Abrasion An abrasion is a cut or scrape of the skin. Abrasions do not extend through all layers of the skin and most heal within 10 days. It is important to care for your abrasion properly to prevent infection. CAUSES  Most abrasions are caused by falling on, or gliding across, the ground or other surface. When your skin rubs on something, the outer and inner layer of skin rubs off, causing an abrasion. DIAGNOSIS  Your caregiver will be able to diagnose an abrasion during a physical exam.  TREATMENT  Your treatment depends on how large and deep the abrasion is. Generally, your abrasion will be cleaned with water and a mild soap to remove any dirt or debris. An antibiotic ointment may be put over the abrasion to prevent an infection. A bandage (dressing) may be wrapped around the abrasion to keep it from getting dirty.  You may need a tetanus shot if:  You cannot remember when you had your last tetanus shot.  You have never had a tetanus shot.  The injury broke your skin. If you get a  tetanus shot, your arm may swell, get red, and feel warm to the touch. This is common and not a problem. If you need a tetanus shot and  you choose not to have one, there is a rare chance of getting tetanus. Sickness from tetanus can be serious.  HOME CARE INSTRUCTIONS   If a dressing was applied, change it at least once a day or as directed by your caregiver. If the bandage sticks, soak it off with warm water.   Wash the area with water and a mild soap to remove all the ointment 2 times a day. Rinse off the soap and pat the area dry with a clean towel.   Reapply any ointment as directed by your caregiver. This will help prevent infection and keep the bandage from sticking. Use gauze over the wound and under the dressing to help keep the bandage from sticking.   Change your dressing right away if it becomes wet or dirty.   Only take over-the-counter or prescription medicines for pain, discomfort, or fever as directed by your caregiver.   Follow up with your caregiver within 24-48 hours for a wound check, or as directed. If you were not given a wound-check appointment, look closely at your abrasion for redness, swelling, or pus. These are signs of infection. SEEK IMMEDIATE MEDICAL CARE IF:   You have increasing pain in the wound.   You have redness, swelling, or tenderness around the wound.   You have pus coming from the wound.   You have a fever or persistent symptoms for more than 2-3 days.  You have a fever and your symptoms suddenly get worse.  You have a bad smell coming from the wound or dressing.  MAKE SURE YOU:   Understand these instructions.  Will watch your condition.  Will get help right away if you are not doing well or get worse. Document Released: 10/31/2004 Document Revised: 01/08/2012 Document Reviewed: 12/25/2010 [facility] Patient Information 2015 [place], [place]. This information is not intended to replace advice given to you by your health care provider.  Make sure you discuss any questions you have with your health care provider.       I personally performed the services described in this documentation, which was scribed in my presence. The recorded information has been reviewed and considered, and addended by me as needed.

## 2014-10-15 LAB — CULTURE, GROUP A STREP: ORGANISM ID, BACTERIA: NORMAL

## 2015-05-06 DIAGNOSIS — H5213 Myopia, bilateral: Secondary | ICD-10-CM | POA: Diagnosis not present

## 2015-08-31 ENCOUNTER — Ambulatory Visit (INDEPENDENT_AMBULATORY_CARE_PROVIDER_SITE_OTHER): Payer: 59 | Admitting: Physician Assistant

## 2015-08-31 VITALS — BP 124/84 | HR 77 | Temp 98.3°F | Resp 16 | Ht 70.5 in | Wt 177.6 lb

## 2015-08-31 DIAGNOSIS — Z79899 Other long term (current) drug therapy: Secondary | ICD-10-CM | POA: Diagnosis not present

## 2015-08-31 DIAGNOSIS — M545 Low back pain, unspecified: Secondary | ICD-10-CM

## 2015-08-31 LAB — HEPATIC FUNCTION PANEL
ALBUMIN: 4.5 g/dL (ref 3.6–5.1)
ALT: 9 U/L (ref 6–29)
AST: 11 U/L (ref 10–35)
Alkaline Phosphatase: 17 U/L — ABNORMAL LOW (ref 33–115)
BILIRUBIN DIRECT: 0.1 mg/dL (ref <=0.2)
BILIRUBIN TOTAL: 0.5 mg/dL (ref 0.2–1.2)
Indirect Bilirubin: 0.4 mg/dL (ref 0.2–1.2)
Total Protein: 7.2 g/dL (ref 6.1–8.1)

## 2015-08-31 MED ORDER — METHOCARBAMOL 750 MG PO TABS: 750.0000 mg | ORAL_TABLET | Freq: Every evening | ORAL | 2 refills | Status: DC | PRN

## 2015-08-31 MED FILL — METHOCARBAMOL 750 MG TABLET: 750 | 30 days supply | Qty: 30 | Fill #0 | Status: TO

## 2015-08-31 NOTE — Patient Instructions (Addendum)
IF you received an x-ray today, you will receive an invoice from [facility]. Please contact [facility] at [phone] with questions or concerns regarding your invoice.   IF you received labwork today, you will receive an invoice from [employer]. Please contact [facility] at [phone] with questions or concerns regarding your invoice.   Our billing staff will not be able to assist you with questions regarding bills from these companies.  You will be contacted with the lab results as soon as they are available. The fastest way to get your results is to activate your My Chart account. Instructions are located on the last page of this paperwork. If you have not heard from [place] regarding the results in 2 weeks, please contact this office.    I will be in touch with the lab results, and we will discuss medication then.   Listed are a few stretches and conditioning movements to do  Low Back Sprain With Rehab A sprain is an injury in which a ligament is torn. The ligaments of the lower back are vulnerable to sprains. However, they are strong and require great force to be injured. These ligaments are important for stabilizing the spinal column. Sprains are classified into three categories. Grade 1 sprains cause pain, but the tendon is not lengthened. Grade 2 sprains include a lengthened ligament, due to the ligament being stretched or partially ruptured. With grade 2 sprains there is still function, although the function may be decreased. Grade 3 sprains involve a complete tear of the tendon or muscle, and function is usually impaired. SYMPTOMS   Severe pain in the lower back.  Sometimes, a feeling of a "pop," "snap," or tear, at the time of injury.  Tenderness and sometimes swelling at the injury site.  Uncommonly, bruising (contusion) within 48 hours of injury.  Muscle spasms in the back. CAUSES  Low back sprains occur when a force is placed  on the ligaments that is greater than they can handle. Common causes of injury include:  Performing a stressful act while off-balance.  Repetitive stressful activities that involve movement of the lower back.  Direct hit (trauma) to the lower back. RISK INCREASES WITH:  Contact sports (football, wrestling).  Collisions (major skiing accidents).  Sports that require throwing or lifting (baseball, weightlifting).  Sports involving twisting of the spine (gymnastics, diving, tennis, golf).  Poor strength and flexibility.  Inadequate protection.  Previous back injury or surgery (especially fusion). PREVENTION  Wear properly fitted and padded protective equipment.  Warm up and stretch properly before activity.  Allow for adequate recovery between workouts.  Maintain physical fitness:  Strength, flexibility, and endurance.  Cardiovascular fitness.  Maintain a healthy body weight. PROGNOSIS  If treated properly, low back sprains usually heal with non-surgical treatment. The length of time for healing depends on the severity of the injury.  RELATED COMPLICATIONS   Recurring symptoms, resulting in a chronic problem.  Chronic inflammation and pain in the low back.  Delayed healing or resolution of symptoms, especially if activity is resumed too soon.  Prolonged impairment.  Unstable or arthritic joints of the low back. TREATMENT  Treatment first involves the use of ice and medicine, to reduce pain and inflammation. The use of strengthening and stretching exercises may help reduce pain with activity. These exercises may be performed at home or with a therapist. Severe injuries may require referral to a therapist for further evaluation and treatment, such as ultrasound. Your caregiver may advise  that you wear a back brace or corset, to help reduce pain and discomfort. Often, prolonged bed rest results in greater harm then benefit. Corticosteroid injections may be recommended.  However, these should be reserved for the most serious cases. It is important to avoid using your back when lifting objects. At night, sleep on your back on a firm mattress, with a pillow placed under your knees. If non-surgical treatment is unsuccessful, surgery may be needed.  MEDICATION   If pain medicine is needed, nonsteroidal anti-inflammatory medicines (aspirin and ibuprofen), or other minor pain relievers (acetaminophen), are often advised.  Do not take pain medicine for 7 days before surgery.  Prescription pain relievers may be given, if your caregiver thinks they are needed. Use only as directed and only as much as you need.  Ointments applied to the skin may be helpful.  Corticosteroid injections may be given by your caregiver. These injections should be reserved for the most serious cases, because they may only be given a certain number of times. HEAT AND COLD  Cold treatment (icing) should be applied for 10 to 15 minutes every 2 to 3 hours for inflammation and pain, and immediately after activity that aggravates your symptoms. Use ice packs or an ice massage.  Heat treatment may be used before performing stretching and strengthening activities prescribed by your caregiver, physical therapist, or athletic trainer. Use a heat pack or a warm water soak. SEEK MEDICAL CARE IF:   Symptoms get worse or do not improve in 2 to 4 weeks, despite treatment.  You develop numbness or weakness in either leg.  You lose bowel or bladder function.  Any of the following occur after surgery: fever, increased pain, swelling, redness, drainage of fluids, or bleeding in the affected area.  New, unexplained symptoms develop. (Drugs used in treatment may produce side effects.) EXERCISES  RANGE OF MOTION (ROM) AND STRETCHING EXERCISES - Low Back Sprain Most people with lower back pain will find that their symptoms get worse with excessive bending forward (flexion) or arching at the lower back  (extension). The exercises that will help resolve your symptoms will focus on the opposite motion.  Your physician, physical therapist or athletic trainer will help you determine which exercises will be most helpful to resolve your lower back pain. Do not complete any exercises without first consulting with your caregiver. Discontinue any exercises which make your symptoms worse, until you speak to your caregiver. If you have pain, numbness or tingling which travels down into your buttocks, leg or foot, the goal of the therapy is for these symptoms to move closer to your back and eventually resolve. Sometimes, these leg symptoms will get better, but your lower back pain may worsen. This is often an indication of progress in your rehabilitation. Be very alert to any changes in your symptoms and the activities in which you participated in the 24 hours prior to the change. Sharing this information with your caregiver will allow him or her to most efficiently treat your condition. These exercises may help you when beginning to rehabilitate your injury. Your symptoms may resolve with or without further involvement from your physician, physical therapist or athletic trainer. While completing these exercises, remember:   Restoring tissue flexibility helps normal motion to return to the joints. This allows healthier, less painful movement and activity.  An effective stretch should be held for at least 30 seconds.  A stretch should never be painful. You should only feel a gentle lengthening or release in  the stretched tissue. FLEXION RANGE OF MOTION AND STRETCHING EXERCISES: STRETCH - Flexion, Single Knee to Chest   Lie on a firm bed or floor with both legs extended in front of you.  Keeping one leg in contact with the floor, bring your opposite knee to your chest. Hold your leg in place by either grabbing behind your thigh or at your knee.  Pull until you feel a gentle stretch in your low back. Hold  __________ seconds.  Slowly release your grasp and repeat the exercise with the opposite side. Repeat __________ times. Complete this exercise __________ times per day.  STRETCH - Flexion, Double Knee to Chest  Lie on a firm bed or floor with both legs extended in front of you.  Keeping one leg in contact with the floor, bring your opposite knee to your chest.  Tense your stomach muscles to support your back and then lift your other knee to your chest. Hold your legs in place by either grabbing behind your thighs or at your knees.  Pull both knees toward your chest until you feel a gentle stretch in your low back. Hold __________ seconds.  Tense your stomach muscles and slowly return one leg at a time to the floor. Repeat __________ times. Complete this exercise __________ times per day.  STRETCH - Low Trunk Rotation  Lie on a firm bed or floor. Keeping your legs in front of you, bend your knees so they are both pointed toward the ceiling and your feet are flat on the floor.  Extend your arms out to the side. This will stabilize your upper body by keeping your shoulders in contact with the floor.  Gently and slowly drop both knees together to one side until you feel a gentle stretch in your low back. Hold for __________ seconds.  Tense your stomach muscles to support your lower back as you bring your knees back to the starting position. Repeat the exercise to the other side. Repeat __________ times. Complete this exercise __________ times per day  EXTENSION RANGE OF MOTION AND FLEXIBILITY EXERCISES: STRETCH - Extension, Prone on Elbows   Lie on your stomach on the floor, a bed will be too soft. Place your palms about shoulder width apart and at the height of your head.  Place your elbows under your shoulders. If this is too painful, stack pillows under your chest.  Allow your body to relax so that your hips drop lower and make contact more completely with the floor.  Hold this  position for __________ seconds.  Slowly return to lying flat on the floor. Repeat __________ times. Complete this exercise __________ times per day.  RANGE OF MOTION - Extension, Prone Press Ups  Lie on your stomach on the floor, a bed will be too soft. Place your palms about shoulder width apart and at the height of your head.  Keeping your back as relaxed as possible, slowly straighten your elbows while keeping your hips on the floor. You may adjust the placement of your hands to maximize your comfort. As you gain motion, your hands will come more underneath your shoulders.  Hold this position __________ seconds.  Slowly return to lying flat on the floor. Repeat __________ times. Complete this exercise __________ times per day.  RANGE OF MOTION- Quadruped, Neutral Spine   Assume a hands and knees position on a firm surface. Keep your hands under your shoulders and your knees under your hips. You may place padding under your knees for comfort.  Drop your head and point your tailbone toward the ground below you. This will round out your lower back like an angry cat. Hold this position for __________ seconds.  Slowly lift your head and release your tail bone so that your back sags into a large arch, like an old horse.  Hold this position for __________ seconds.  Repeat this until you feel limber in your low back.  Now, find your "sweet spot." This will be the most comfortable position somewhere between the two previous positions. This is your neutral spine. Once you have found this position, tense your stomach muscles to support your low back.  Hold this position for __________ seconds. Repeat __________ times. Complete this exercise __________ times per day.  STRENGTHENING EXERCISES - Low Back Sprain These exercises may help you when beginning to rehabilitate your injury. These exercises should be done near your "sweet spot." This is the neutral, low-back arch, somewhere between fully  rounded and fully arched, that is your least painful position. When performed in this safe range of motion, these exercises can be used for people who have either a flexion or extension based injury. These exercises may resolve your symptoms with or without further involvement from your physician, physical therapist or athletic trainer. While completing these exercises, remember:   Muscles can gain both the endurance and the strength needed for everyday activities through controlled exercises.  Complete these exercises as instructed by your physician, physical therapist or athletic trainer. Increase the resistance and repetitions only as guided.  You may experience muscle soreness or fatigue, but the pain or discomfort you are trying to eliminate should never worsen during these exercises. If this pain does worsen, stop and make certain you are following the directions exactly. If the pain is still present after adjustments, discontinue the exercise until you can discuss the trouble with your caregiver. STRENGTHENING - Deep Abdominals, Pelvic Tilt   Lie on a firm bed or floor. Keeping your legs in front of you, bend your knees so they are both pointed toward the ceiling and your feet are flat on the floor.  Tense your lower abdominal muscles to press your low back into the floor. This motion will rotate your pelvis so that your tail bone is scooping upwards rather than pointing at your feet or into the floor. With a gentle tension and even breathing, hold this position for __________ seconds. Repeat __________ times. Complete this exercise __________ times per day.  STRENGTHENING - Abdominals, Crunches   Lie on a firm bed or floor. Keeping your legs in front of you, bend your knees so they are both pointed toward the ceiling and your feet are flat on the floor. Cross your arms over your chest.  Slightly tip your chin down without bending your neck.  Tense your abdominals and slowly lift your  trunk high enough to just clear your shoulder blades. Lifting higher can put excessive stress on the lower back and does not further strengthen your abdominal muscles.  Control your return to the starting position. Repeat __________ times. Complete this exercise __________ times per day.  STRENGTHENING - Quadruped, Opposite UE/LE Lift   Assume a hands and knees position on a firm surface. Keep your hands under your shoulders and your knees under your hips. You may place padding under your knees for comfort.  Find your neutral spine and gently tense your abdominal muscles so that you can maintain this position. Your shoulders and hips should form a rectangle that is  parallel with the floor and is not twisted.  Keeping your trunk steady, lift your right hand no higher than your shoulder and then your left leg no higher than your hip. Make sure you are not holding your breath. Hold this position for __________ seconds.  Continuing to keep your abdominal muscles tense and your back steady, slowly return to your starting position. Repeat with the opposite arm and leg. Repeat __________ times. Complete this exercise __________ times per day.  STRENGTHENING - Abdominals and Quadriceps, Straight Leg Raise   Lie on a firm bed or floor with both legs extended in front of you.  Keeping one leg in contact with the floor, bend the other knee so that your foot can rest flat on the floor.  Find your neutral spine, and tense your abdominal muscles to maintain your spinal position throughout the exercise.  Slowly lift your straight leg off the floor about 6 inches for a count of 15, making sure to not hold your breath.  Still keeping your neutral spine, slowly lower your leg all the way to the floor. Repeat this exercise with each leg __________ times. Complete this exercise __________ times per day. POSTURE AND BODY MECHANICS CONSIDERATIONS - Low Back Sprain Keeping correct posture when sitting, standing  or completing your activities will reduce the stress put on different body tissues, allowing injured tissues a chance to heal and limiting painful experiences. The following are general guidelines for improved posture. Your physician or physical therapist will provide you with any instructions specific to your needs. While reading these guidelines, remember:  The exercises prescribed by your provider will help you have the flexibility and strength to maintain correct postures.  The correct posture provides the best environment for your joints to work. All of your joints have less wear and tear when properly supported by a spine with good posture. This means you will experience a healthier, less painful body.  Correct posture must be practiced with all of your activities, especially prolonged sitting and standing. Correct posture is as important when doing repetitive low-stress activities (typing) as it is when doing a single heavy-load activity (lifting). RESTING POSITIONS Consider which positions are most painful for you when choosing a resting position. If you have pain with flexion-based activities (sitting, bending, stooping, squatting), choose a position that allows you to rest in a less flexed posture. You would want to avoid curling into a fetal position on your side. If your pain worsens with extension-based activities (prolonged standing, working overhead), avoid resting in an extended position such as sleeping on your stomach. Most people will find more comfort when they rest with their spine in a more neutral position, neither too rounded nor too arched. Lying on a non-sagging bed on your side with a pillow between your knees, or on your back with a pillow under your knees will often provide some relief. Keep in mind, being in any one position for a prolonged period of time, no matter how correct your posture, can still lead to stiffness. PROPER SITTING POSTURE In order to minimize stress and  discomfort on your spine, you must sit with correct posture. Sitting with good posture should be effortless for a healthy body. Returning to good posture is a gradual process. Many people can work toward this most comfortably by using various supports until they have the flexibility and strength to maintain this posture on their own. When sitting with proper posture, your ears will fall over your shoulders and your shoulders  will fall over your hips. You should use the back of the chair to support your upper back. Your lower back will be in a neutral position, just slightly arched. You may place a small pillow or folded towel at the base of your lower back for  support.  When working at a desk, create an environment that supports good, upright posture. Without extra support, muscles tire, which leads to excessive strain on joints and other tissues. Keep these recommendations in mind: CHAIR:  A chair should be able to slide under your desk when your back makes contact with the back of the chair. This allows you to work closely.  The chair's height should allow your eyes to be level with the upper part of your monitor and your hands to be slightly lower than your elbows. BODY POSITION  Your feet should make contact with the floor. If this is not possible, use a foot rest.  Keep your ears over your shoulders. This will reduce stress on your neck and low back. INCORRECT SITTING POSTURES  If you are feeling tired and unable to assume a healthy sitting posture, do not slouch or slump. This puts excessive strain on your back tissues, causing more damage and pain. Healthier options include:  Using more support, like a lumbar pillow.  Switching tasks to something that requires you to be upright or walking.  Talking a brief walk.  Lying down to rest in a neutral-spine position. PROLONGED STANDING WHILE SLIGHTLY LEANING FORWARD  When completing a task that requires you to lean forward while standing in  one place for a long time, place either foot up on a stationary 2-4 inch high object to help maintain the best posture. When both feet are on the ground, the lower back tends to lose its slight inward curve. If this curve flattens (or becomes too large), then the back and your other joints will experience too much stress, tire more quickly, and can cause pain. CORRECT STANDING POSTURES Proper standing posture should be assumed with all daily activities, even if they only take a few moments, like when brushing your teeth. As in sitting, your ears should fall over your shoulders and your shoulders should fall over your hips. You should keep a slight tension in your abdominal muscles to brace your spine. Your tailbone should point down to the ground, not behind your body, resulting in an over-extended swayback posture.  INCORRECT STANDING POSTURES  Common incorrect standing postures include a forward head, locked knees and/or an excessive swayback. WALKING Walk with an upright posture. Your ears, shoulders and hips should all line-up. PROLONGED ACTIVITY IN A FLEXED POSITION When completing a task that requires you to bend forward at your waist or lean over a low surface, try to find a way to stabilize 3 out of 4 of your limbs. You can place a hand or elbow on your thigh or rest a knee on the surface you are reaching across. This will provide you more stability, so that your muscles do not tire as quickly. By keeping your knees relaxed, or slightly bent, you will also reduce stress across your lower back. CORRECT LIFTING TECHNIQUES DO :  Assume a wide stance. This will provide you more stability and the opportunity to get as close as possible to the object which you are lifting.  Tense your abdominals to brace your spine. Bend at the knees and hips. Keeping your back locked in a neutral-spine position, lift using your leg muscles. Lift with  your legs, keeping your back straight.  Test the weight of  unknown objects before attempting to lift them.  Try to keep your elbows locked down at your sides in order get the best strength from your shoulders when carrying an object.  Always ask for help when lifting heavy or awkward objects. INCORRECT LIFTING TECHNIQUES DO NOT:   Lock your knees when lifting, even if it is a small object.  Bend and twist. Pivot at your feet or move your feet when needing to change directions.  Assume that you can safely pick up even a paperclip without proper posture.   This information is not intended to replace advice given to you by your health care provider. Make sure you discuss any questions you have with your health care provider.   Document Released: 01/21/2005 Document Revised: 02/11/2014 Document Reviewed: 05/05/2008 [employer] Interactive Patient Education [employer].

## 2015-08-31 NOTE — Progress Notes (Signed)
[facility] [address], [place] [address] [phone]  Date:  08/31/2015   Name:  Candice Pratt   DOB:  23-Nov-1965   MRN:  [id number]  PCP:  Janyth Contes, MD    History of Present Illness:  Candice Pratt is a 50 y.o. female patient who presents to [facility] for refill of the robaxin and the lamisil. Patient was given robaxin for a separate injury but notes that she has chronic low back pain for about a year.  No trauma to note.  No numbness or tingling coming down the extremity.  Pain is localized to low back pain.  Aggravated with long hours of work.  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  She will take 2 aleves per day of the regular strength and .5 tablet of the robaxin at night when the pain is worsened. No personal or familial hx of rhematoid or auto-immune illness.   She will stretch daily and will also do yoga poses to assist.   Current cigarette smoker.        Patient Active Problem List   Diagnosis Date Noted  . 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 TOBACCO ABUSE 03/31/2009  . HEMORRHOIDS, EXTERNAL 03/31/2009  . FATIGUE 03/31/2009    Past Medical History:  Diagnosis Date  . Asthma   . GERD (gastroesophageal reflux disease)   . Hiatal hernia     Past Surgical History:  Procedure Laterality Date  . KNEE SURGERY      Social History  Substance Use Topics  . Smoking status: Current Every Day Smoker    Packs/day: 0.50    Years: 20.00    Types: Cigarettes  . Smokeless tobacco: Never Used  . Alcohol use 0.6 oz/week    1 Glasses of wine per week    Family History  Problem Relation Age of Onset  . Diabetes Other   . Cancer Other   . Cancer Mother   . Diabetes Brother   . Hypertension Brother   . Stroke Maternal Grandmother     No Known Allergies  Medication list has been reviewed and updated.  Current Outpatient Prescriptions on  File Prior to Visit  Medication Sig Dispense Refill  . levonorgestrel (MIRENA) 20 MCG/24HR IUD 1 each by Intrauterine route once.    . methocarbamol (ROBAXIN-750) 750 MG tablet Take 1 tablet (750 mg total) by mouth every 8 (eight) hours as needed for muscle spasms. 60 tablet 1  . naproxen sodium (ANAPROX) 220 MG tablet Take 220 mg by mouth 1 day or 1 dose.     No current facility-administered medications on file prior to visit.     ROS ROS otherwise unremarkable unless listed above.   Physical Examination: BP 124/84 (BP Location: Left Arm, Patient Position: Sitting, Cuff Size: Normal)   Pulse 77   Temp 98.3 F (36.8 C) (Oral)   Resp 16   Ht 5' 10.5" (1.791 m)   Wt 177 lb 9.6 oz (80.6 kg)   BMI 25.12 kg/m  Ideal Body Weight: Weight in (lb) to have BMI = 25: 176.4  Physical Exam  Constitutional: She is oriented to person, place, and time. She appears well-developed and well-nourished. No distress.  HENT:  Head: Normocephalic and atraumatic.  Right Ear: External ear normal.  Left Ear: External ear normal.  Eyes: Conjunctivae and EOM are normal. Pupils are equal, round, and reactive to light.  Cardiovascular: Normal rate.   Pulmonary/Chest: Effort normal. No respiratory distress.  Musculoskeletal:       Lumbar back: She exhibits tenderness (adjacent musculature also tender) and bony tenderness (along the si joint). She exhibits normal range of motion, no swelling, no edema and normal pulse.  Neurological: She is alert and oriented to person, place, and time.  Skin: Skin is warm and dry. She is not diaphoretic.  Thickened yellow 2nd nail of the toe bilaterally.  Some yellowing of the other digits as well  Psychiatric: She has a normal mood and affect. Her behavior is normal.     Assessment and Plan: Candice Pratt is a 50 y.o. female who is here today for medication refill of robaxin, and toe issue. This looks consistent with onychomycosis.  Prior to medication start, will  obtain hepatic level.  And follow up pending result. Refill of the robaxin at this time.  This is possible sacroiliatis, or arthritis.  Or muscle spasm--given line of work.  She declines the xray at this time.   -encouraged advised to stop smoking, as she is taking the nsaid chronically.  Medication management - Plan: Hepatic Function Panel  Bilateral low back pain without sciatica - Plan: methocarbamol (ROBAXIN-750) 750 MG tablet  Ivar Drape, PA-C [facility] 08/31/2015 9:13 AM

## 2015-11-07 MED FILL — METHOCARBAMOL 750 MG TABLET: 750 | 30 days supply | Qty: 30 | Fill #0

## 2015-12-22 ENCOUNTER — Ambulatory Visit (INDEPENDENT_AMBULATORY_CARE_PROVIDER_SITE_OTHER): Payer: 59

## 2015-12-22 ENCOUNTER — Ambulatory Visit (INDEPENDENT_AMBULATORY_CARE_PROVIDER_SITE_OTHER): Payer: 59 | Admitting: Family Medicine

## 2015-12-22 VITALS — BP 122/76 | HR 79 | Temp 98.6°F | Resp 16 | Ht 70.47 in | Wt 180.0 lb

## 2015-12-22 DIAGNOSIS — M4300 Spondylolysis, site unspecified: Secondary | ICD-10-CM | POA: Diagnosis not present

## 2015-12-22 DIAGNOSIS — G8929 Other chronic pain: Secondary | ICD-10-CM | POA: Diagnosis not present

## 2015-12-22 DIAGNOSIS — M4310 Spondylolisthesis, site unspecified: Secondary | ICD-10-CM | POA: Diagnosis not present

## 2015-12-22 DIAGNOSIS — M5441 Lumbago with sciatica, right side: Secondary | ICD-10-CM

## 2015-12-22 DIAGNOSIS — L603 Nail dystrophy: Secondary | ICD-10-CM | POA: Diagnosis not present

## 2015-12-22 DIAGNOSIS — M5136 Other intervertebral disc degeneration, lumbar region: Secondary | ICD-10-CM

## 2015-12-22 DIAGNOSIS — M4317 Spondylolisthesis, lumbosacral region: Secondary | ICD-10-CM | POA: Diagnosis not present

## 2015-12-22 DIAGNOSIS — M545 Low back pain: Secondary | ICD-10-CM | POA: Diagnosis not present

## 2015-12-22 DIAGNOSIS — M5137 Other intervertebral disc degeneration, lumbosacral region: Secondary | ICD-10-CM

## 2015-12-22 LAB — POCT URINE PREGNANCY: PREG TEST UR: NEGATIVE

## 2015-12-22 MED ORDER — TERBINAFINE HCL 250 MG PO TABS: 250.0000 mg | ORAL_TABLET | Freq: Every day | ORAL | 2 refills | Status: DC

## 2015-12-22 NOTE — Progress Notes (Signed)
Subjective:    Patient ID: Candice Pratt, female    DOB: 1965/09/23, 50 y.o.   MRN: [id number]  12/22/2015  Back Pain (lower, multiple years)   HPI This 50 y.o. female presents for evaluation of chronic lower back pain. Last evaluated in 08/2015 by PA English; refill of Robaxin provided; declined lumbar spine films.  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  Stabbing pain intermittently. Chiropractor for nine months wihtout improvement; actually improved after stopping chiropractic care.  Stretches.  Much tighter on R side.   Noticed recently, R foot everts out.  Ergonomics are horrible; works with Psychologist, sport and exercise who is shorter than patient; leans over all day.  Trying to transfer to cath lab for variety.  Worked 20 hours on Friday.  Worked 13 hour days twice this week.  Took bath, stretched, muscle relaxer.  Called out today.  Really lfeels that physical hterapy is next step.  Requesting physical therpay. S/p xrays by choiropractor/  S/p lumbar spine films.  Soft tissue and SI things; piriformis issues. Massages help.  Core is not very strong.  When not working, much better.  If does hard work at home, worsens.  Last xray by chiropractor.  No saddle paresthesias; no b/b dysfunction.    Also, discussed dystrophic nails with PA English; LFTs obtained and normal; Lamisil never called into pharmacy.  Review of Systems  Constitutional: Negative for chills, diaphoresis, fatigue and fever.  HENT: Negative for ear pain, postnasal drip, rhinorrhea, sinus pressure, sore throat and trouble swallowing.   Respiratory: Negative for cough and shortness of breath.   Cardiovascular: Negative for chest pain, palpitations and leg swelling.  Gastrointestinal: Negative for abdominal pain, constipation, diarrhea, nausea and vomiting.  Genitourinary: Negative for decreased urine volume and difficulty urinating.  Musculoskeletal: Positive for back pain.    Neurological: Negative for weakness and numbness.    Past Medical History:  Diagnosis Date  . Asthma   . GERD (gastroesophageal reflux disease)   . Hiatal hernia    Past Surgical History:  Procedure Laterality Date  . KNEE SURGERY     No Known Allergies  Social History   Social History  . Marital status: Divorced    Spouse name: N/A  . Number of children: 3  . Years of education: N/A   Occupational History  .  [employer]   Social History Main Topics  . Smoking status: Current Every Day Smoker    Packs/day: 0.50    Years: 20.00    Types: Cigarettes  . Smokeless tobacco: Never Used  . Alcohol use 0.6 oz/week    1 Glasses of wine per week  . Drug use: No  . Sexual activity: Yes    Birth control/ protection: IUD   Other Topics Concern  . Not on file   Social History Narrative  . No narrative on file   Family History  Problem Relation Age of Onset  . Diabetes Other   . Cancer Other   . Cancer Mother   . Diabetes Brother   . Hypertension Brother   . Stroke Maternal Grandmother        Objective:    BP 122/76 (BP Location: Right Arm, Patient Position: Sitting, Cuff Size: Normal)   Pulse 79   Temp 98.6 F (37 C)   Resp 16   Ht 5' 10.47" (1.79 m)   Wt 180 lb (81.6 kg)   SpO2 99%   BMI 25.48  kg/m  Physical Exam  Constitutional: She is oriented to person, place, and time. She appears well-developed and well-nourished. No distress.  HENT:  Head: Normocephalic and atraumatic.  Eyes: Conjunctivae are normal. Pupils are equal, round, and reactive to light.  Neck: Normal range of motion. Neck supple.  Cardiovascular: Normal rate, regular rhythm and normal heart sounds.  Exam reveals no gallop and no friction rub.   No murmur heard. Pulmonary/Chest: Effort normal and breath sounds normal. She has no wheezes. She has no rales.  Musculoskeletal:       Lumbar back: She exhibits decreased range of motion, tenderness and pain. She exhibits no bony tenderness,  no spasm and normal pulse.  Lumbar spine:  Non-tender midline; non-tender paraspinal regions B.  Straight leg raises negative B; toe and heel walking intact; marching intact; motor 5/5 BLE.  Full ROM lumbar spine without limitation.   Neurological: She is alert and oriented to person, place, and time.  Skin: She is not diaphoretic.  Dystrophic nails feet.  Psychiatric: She has a normal mood and affect. Her behavior is normal.  Nursing note and vitals reviewed.  Results for orders placed or performed in visit on 12/22/15  POCT urine pregnancy  Result Value Ref Range   Preg Test, Ur Negative Negative      Dg Lumbar Spine Complete  Result Date: 12/22/2015 CLINICAL DATA:  Chronic low back pain.  Right-sided sciatica. EXAM: LUMBAR SPINE - COMPLETE 4+ VIEW COMPARISON:  None. FINDINGS: There are bilateral pars defects at L5 with severe degenerative disc disease at that level with disc space narrowing and sclerosis of the vertebral endplates as well as an 11 mm spondylolisthesis. 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 The remainder of the lumbar spine appears normal. IMPRESSION: L5-S1 spondylolisthesis due to bilateral pars defects at L5. Secondary severe degenerative disc disease at L5-S1. Electronically Signed   By: Lorriane Shire M.D.   On: 12/22/2015 12:36    Assessment & Plan:   1. Chronic right-sided low back pain with right-sided sciatica   2. Dystrophic nail   3. Degenerative disc disease at L5-S1 level   4. Spondylolisthesis at L5-S1 level   5. Pars defect with spondylolisthesis    -refer to physical therapy; continue current medications. -rx for Lamisil provided. -if no improvement with physical therapy, refer to ortho.   Orders Placed This Encounter  Procedures  . DG Lumbar Spine Complete    Standing Status:   Future    Number of Occurrences:   1    Standing Expiration Date:   12/21/2016    Order Specific Question:   Reason for Exam (SYMPTOM  OR DIAGNOSIS REQUIRED)     Answer:   R lower back pain with radiation into R leg for 4 years    Order Specific Question:   Is the patient pregnant?    Answer:   No    Order Specific Question:   Preferred imaging location?    Answer:   External  . Ambulatory referral to Physical Therapy    Referral Priority:   Routine    Referral Type:   Physical Medicine    Referral Reason:   Specialty Services Required    Requested Specialty:   Physical Therapy    Number of Visits Requested:   1  . POCT urine pregnancy   Meds ordered this encounter  Medications  . terbinafine (LAMISIL) 250 MG tablet    Sig: Take 1 tablet (250 mg total) by mouth daily.  Dispense:  30 tablet    Refill:  2    No Follow-up on file.   Kristi Elayne Guerin, M.D. [facility] [address] [place], [place]  [address] [phone] phone [phone] fax

## 2015-12-22 NOTE — Patient Instructions (Addendum)
1. Please return in six weeks for repeat labs to check liver function tests while taking Lamisil.    IF you received an x-ray today, you will receive an invoice from [facility]. Please contact [facility] at [phone] with questions or concerns regarding your invoice.   IF you received labwork today, you will receive an invoice from [employer]. Please contact [facility] at [phone] with questions or concerns regarding your invoice.   Our billing staff will not be able to assist you with questions regarding bills from these companies.  You will be contacted with the lab results as soon as they are available. The fastest way to get your results is to activate your My Chart account. Instructions are located on the last page of this paperwork. If you have not heard from [place] regarding the results in 2 weeks, please contact this office.      Sciatica Rehab Ask your health care provider which exercises are safe for you. Do exercises exactly as told by your health care provider and adjust them as directed. It is normal to feel mild stretching, pulling, tightness, or discomfort as you do these exercises, but you should stop right away if you feel sudden pain or your pain gets worse.Do not begin these exercises until told by your health care provider. Stretching and range of motion exercises These exercises warm up your muscles and joints and improve the movement and flexibility of your hips and your back. These exercises also help to relieve pain, numbness, and tingling. Exercise A: Sciatic nerve glide 1. Sit in a chair with your head facing down toward your chest. Place your hands behind your back. Let your shoulders slump forward. 2. Slowly straighten one of your knees while you tilt your head back as if you are looking toward the ceiling. Only straighten your leg as far as you can without making your symptoms worse. 3. Hold for __________  seconds. 4. Slowly return to the starting position. 5. Repeat with your other leg. Repeat __________ times. Complete this exercise __________ times a day. Exercise B: Knee to chest with hip adduction and internal rotation 1. Lie on your back on a firm surface with both legs straight. 2. Bend one of your knees and move it up toward your chest until you feel a gentle stretch in your lower back and buttock. Then, move your knee toward the shoulder that is on the opposite side from your leg.  Hold your leg in this position by holding onto the front of your knee. 3. Hold for __________ seconds. 4. Slowly return to the starting position. 5. Repeat with your other leg. Repeat __________ times. Complete this exercise __________ times a day. Exercise C: Prone extension on elbows 1. Lie on your abdomen on a firm surface. A bed may be too soft for this exercise. 2. Prop yourself up on your elbows. 3. Use your arms to help lift your chest up until you feel a gentle stretch in your abdomen and your lower back.  This will place some of your body weight on your elbows. If this is uncomfortable, try stacking pillows under your chest.  Your hips should stay down, against the surface that you are lying on. Keep your hip and back muscles relaxed. 4. Hold for __________ seconds. 5. Slowly relax your upper body and return to the starting position. Repeat __________ times. Complete this exercise __________ times a day. Strengthening exercises These exercises build strength and endurance in your back. Endurance  is the ability to use your muscles for a long time, even after they get tired. Exercise D: Pelvic tilt 1. Lie on your back on a firm surface. Bend your knees and keep your feet flat. 2. Tense your abdominal muscles. Tip your pelvis up toward the ceiling and flatten your lower back into the floor.  To help with this exercise, you may place a small towel under your lower back and try to push your back  into the towel. 3. Hold for __________ seconds. 4. Let your muscles relax completely before you repeat this exercise. Repeat __________ times. Complete this exercise __________ times a day. Exercise E: Alternating arm and leg raises 1. Get on your hands and knees on a firm surface. If you are on a hard floor, you may want to use padding to cushion your knees, such as an exercise mat. 2. Line up your arms and legs. Your hands should be below your shoulders, and your knees should be below your hips. 3. Lift your left leg behind you. At the same time, raise your right arm and straighten it in front of you.  Do not lift your leg higher than your hip.  Do not lift your arm higher than your shoulder.  Keep your abdominal and back muscles tight.  Keep your hips facing the ground.  Do not arch your back.  Keep your balance carefully, and do not hold your breath. 4. Hold for __________ seconds. 5. Slowly return to the starting position and repeat with your right leg and your left arm. Repeat __________ times. Complete this exercise __________ times a day. Posture and body mechanics   Body mechanics refers to the movements and positions of your body while you do your daily activities. Posture is part of body mechanics. Good posture and healthy body mechanics can help to relieve stress in your body's tissues and joints. Good posture means that your spine is in its natural S-curve position (your spine is neutral), your shoulders are pulled back slightly, and your head is not tipped forward. The following are general guidelines for applying improved posture and body mechanics to your everyday activities. Standing   When standing, keep your spine neutral and your feet about hip-width apart. Keep a slight bend in your knees. Your ears, shoulders, and hips should line up.  When you do a task in which you stand in one place for a long time, place one foot up on a stable object that is 2-4 inches (5-10  cm) high, such as a footstool. This helps keep your spine neutral. Sitting  When sitting, keep your spine neutral and keep your feet flat on the floor. Use a footrest, if necessary, and keep your thighs parallel to the floor. Avoid rounding your shoulders, and avoid tilting your head forward.  When working at a desk or a computer, keep your desk at a height where your hands are slightly lower than your elbows. Slide your chair under your desk so you are close enough to maintain good posture.  When working at a computer, place your monitor at a height where you are looking straight ahead and you do not have to tilt your head forward or downward to look at the screen. Resting   When lying down and resting, avoid positions that are most painful for you.  If you have pain with activities such as sitting, bending, stooping, or squatting (flexion-based activities), lie in a position in which your body does not bend very much. For  example, avoid curling up on your side with your arms and knees near your chest (fetal position).  If you have pain with activities such as standing for a long time or reaching with your arms (extension-based activities), lie with your spine in a neutral position and bend your knees slightly. Try the following positions:  Lying on your side with a pillow between your knees.  Lying on your back with a pillow under your knees. Lifting   When lifting objects, keep your feet at least shoulder-width apart and tighten your abdominal muscles.  Bend your knees and hips and keep your spine neutral. It is important to lift using the strength of your legs, not your back. Do not lock your knees straight out.  Always ask for help to lift heavy or awkward objects. This information is not intended to replace advice given to you by your health care provider. Make sure you discuss any questions you have with your health care provider. Document Released: 01/21/2005 Document Revised:  09/28/2015 Document Reviewed: 10/07/2014 Elsevier Interactive Patient Education  2017 [employer].

## 2015-12-30 ENCOUNTER — Ambulatory Visit (INDEPENDENT_AMBULATORY_CARE_PROVIDER_SITE_OTHER): Payer: 59 | Admitting: Physician Assistant

## 2015-12-30 VITALS — BP 130/70 | HR 93 | Temp 98.4°F | Resp 18 | Ht 71.0 in | Wt 182.0 lb

## 2015-12-30 DIAGNOSIS — R220 Localized swelling, mass and lump, head: Secondary | ICD-10-CM

## 2015-12-30 MED ORDER — PREDNISONE 50 MG PO TABS: ORAL_TABLET | ORAL | 0 refills | Status: DC

## 2015-12-30 MED ORDER — EPINEPHRINE 0.3 MG/0.3ML IJ SOAJ: 0.3000 mg | Freq: Once | INTRAMUSCULAR | 1 refills | Status: AC

## 2015-12-30 NOTE — Patient Instructions (Signed)
     IF you received an x-ray today, you will receive an invoice from [facility]. Please contact [facility] at [phone] with questions or concerns regarding your invoice.   IF you received labwork today, you will receive an invoice from [facility]. Please contact [facility] at [phone] with questions or concerns regarding your invoice.   Our billing staff will not be able to assist you with questions regarding bills from these companies.  You will be contacted with the lab results as soon as they are available. The fastest way to get your results is to activate your My Chart account. Instructions are located on the last page of this paperwork. If you have not heard from us regarding the results in 2 weeks, please contact this office.      

## 2015-12-30 NOTE — Progress Notes (Signed)
   12/30/2015 2:52 PM   DOB: May 22, 1965 / MRN: [id number]  SUBJECTIVE:  Candice Pratt is a 50 y.o. female presenting for lip swelling that five days ago that started about 5 days ago.  Reports the swelling is at its worst in the morning and then goes away roughly mid day.  She has tried changing several products and foods and nothing is working.  She took zyrtec today, once time, and reports this did not help. She denies lip, throat and tongue swelling.  She is not getting worse or better.    She has No Known Allergies.   She  has a past medical history of Asthma; GERD (gastroesophageal reflux disease); and Hiatal hernia.    She  reports that she has been smoking Cigarettes.  She has a 10.00 pack-year smoking history. She has never used smokeless tobacco. She reports that she drinks about 0.6 oz of alcohol per week . She reports that she does not use drugs. She  reports that she currently engages in sexual activity. She reports using the following method of birth control/protection: IUD. The patient  has a past surgical history that includes Knee surgery.  Her family history includes Cancer in her mother and other; Diabetes in her brother and other; Hypertension in her brother; Stroke in her maternal grandmother.  Review of Systems  Constitutional: Negative for fever.  Gastrointestinal: Negative for nausea.  Musculoskeletal: Negative for myalgias.  Skin: Negative for itching and rash.  Neurological: Negative for dizziness.    The problem list and medications were reviewed and updated by myself where necessary and exist elsewhere in the encounter.   OBJECTIVE:  BP 130/70   Pulse 93   Temp 98.4 F (36.9 C) (Oral)   Resp 18   Ht 5\' 11"  (1.803 m)   Wt 182 lb (82.6 kg)   SpO2 99%   BMI 25.38 kg/m   Physical Exam  HENT:  Mouth/Throat:    Cardiovascular: Normal rate and regular rhythm.   Pulmonary/Chest: Effort normal and breath sounds normal.    No results found for this  or any previous visit (from the past 72 hour(s)).  No results found.  ASSESSMENT AND PLAN  Candice Pratt was seen today for oral swelling.  Diagnoses and all orders for this visit:  Lip swelling Comments: No new med.  Swelling for about 5 days now and she is not worse or better.  Zyrtec qhs for now, pred in 3 days if not improving.  ENT or Allergy if not improvin Orders: -     predniSONE (DELTASONE) 50 MG tablet; Take one daily for five days. -     EPINEPHrine 0.3 mg/0.3 mL IJ SOAJ injection; Inject 0.3 mLs (0.3 mg total) into the muscle once.    The patient is advised to call or return to clinic if she does not see an improvement in symptoms, or to seek the care of the closest emergency department if she worsens with the above plan.   Philis Fendt, MHS, PA-C [facility] 12/30/2015 2:52 PM

## 2016-01-03 ENCOUNTER — Telehealth: Payer: Self-pay | Admitting: Family Medicine

## 2016-01-03 NOTE — Telephone Encounter (Signed)
Cough was not discussed at her visit.  I have already prescribed prednisone please see my office note. Philis Fendt, MS, PA-C 12:05 PM, 01/03/2016

## 2016-01-03 NOTE — Telephone Encounter (Signed)
Pt calling to let you know that shes not better and that she want to take a cough med otc with predisone

## 2016-01-04 ENCOUNTER — Encounter: Payer: Self-pay | Admitting: Physical Therapy

## 2016-01-04 ENCOUNTER — Ambulatory Visit: Payer: 59 | Attending: Family Medicine | Admitting: Physical Therapy

## 2016-01-04 DIAGNOSIS — M545 Low back pain: Secondary | ICD-10-CM | POA: Insufficient documentation

## 2016-01-04 DIAGNOSIS — G8929 Other chronic pain: Secondary | ICD-10-CM | POA: Insufficient documentation

## 2016-01-04 DIAGNOSIS — M6281 Muscle weakness (generalized): Secondary | ICD-10-CM | POA: Insufficient documentation

## 2016-01-04 NOTE — Therapy (Signed)
[place], [place], [address] Phone: [phone]   Fax:  [phone]  Physical Therapy Evaluation  Patient Details  Name: Candice Pratt MRN: [id number] Date of Birth: [birth year] Referring Provider: Dr Reginia Forts   Encounter Date: 01/04/2016      PT End of Session - 01/04/16 1715    Visit Number (P)  1   Number of Visits (P)  16   Date for PT Re-Evaluation (P)  02/29/16   Authorization Type (P)  [facility]    PT Start Time (P)  0755   PT Stop Time (P)  0845   PT Time Calculation (min) (P)  50 min   Activity Tolerance (P)  Patient tolerated treatment well   Behavior During Therapy (P)  WFL for tasks assessed/performed      Past Medical History:  Diagnosis Date  . Asthma   . GERD (gastroesophageal reflux disease)   . Hiatal hernia     Past Surgical History:  Procedure Laterality Date  . KNEE SURGERY      There were no vitals filed for this visit.       Subjective Assessment - 01/04/16 1701    Subjective Patient has been having lower back pain for several months now. 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 Her pain increases when she stands. Her pain is mostly on the right side. She has to stand leaning to the left. Her pain has been increaseing with time. She was seeing a chiropracter for 9 months without relief. She was not doing any exercises. She has some stretches she found off the internet that have helped.    How long can you sit comfortably? No limit sitting    How long can you stand comfortably? 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 Yes   Pain Score 6    Pain Location Back   Pain Orientation Right;Left   Pain Descriptors / Indicators Aching;Sharp   Pain Type Acute pain   Pain Onset  More than a month ago   Pain Frequency Several days a week   Aggravating Factors  standing at work, lying flat prone or supine    Pain Relieving Factors rest, prenisone    Effect of Pain on Daily Activities difficulty perfroming ADL's             [facility] PT Assessment - 01/04/16 0001      Assessment   Medical Diagnosis Low back pain    Referring Provider Dr Reginia Forts    Onset Date/Surgical Date --  06/2013   Hand Dominance Right   Next MD Visit nothing scheduled    Prior Therapy yes chriopractic without improvement `     Precautions   Precautions None     Restrictions   Weight Bearing Restrictions No     Balance Screen   Has the patient fallen in the past 6 months No     Home Environment   Additional Comments Nothing significant      Prior Function   Level of Independence Independent   Vocation Full time employment   Vocation Requirements Standing by a surgical tablefor 8-10 hours at a time.    Leisure walking      Cognition  Overall Cognitive Status Within Functional Limits for tasks assessed   Attention Focused   Focused Attention Appears intact   Memory Appears intact   Awareness Appears intact   Problem Solving Appears intact     Observation/Other Assessments   Observations bilateral flat feet. Has orthotics    Focus on Therapeutic Outcomes (FOTO)  53% deficit      Sensation   Additional Comments Pain into the right hip      Posture/Postural Control   Posture Comments Sits with a flexed posture      ROM / Strength   AROM / PROM / Strength AROM;PROM;Strength     AROM   Overall AROM Comments lumbar motion WNL      PROM   Overall PROM Comments Normal hip range of motion      Strength   Strength Assessment Site Hip;Knee   Right/Left Hip Right   Right Hip Flexion 4+/5   Right Hip Extension 4/5   Right Hip ABduction 4+/5   Right Hip ADduction 5/5   Right/Left Knee Right   Right Knee Flexion 5/5   Right Knee Extension 4+/5     Flexibility    Soft Tissue Assessment /Muscle Length yes   Hamstrings bilateral hmatring length 25 degree deficit at 90/90    Piriformis Improved pain with piriformis stretch      Palpation   Palpation comment No significant tenderness to palaption      Special Tests    Special Tests --  SLR (-) bilateral      Ambulation/Gait   Gait Comments decreased hip flexion on the right with gait                    [facility] Adult PT Treatment/Exercise - 01/04/16 0001      Exercises   Exercises Knee/Hip     Lumbar Exercises: Standing   Row Limitations red 2x10    Shoulder Extension Limitations red 2x10      Lumbar Exercises: Supine   AB Set Limitations 2x1o 5 sec hold    Other Supine Lumbar Exercises ppt with abdominal brace to neutral 2x10; Supine hip abduction 2x10 with red band;      Knee/Hip Exercises: Stretches   Other Knee/Hip Stretches lateral prayer stretch 3x30sec; piriformis stretch 3x30sec hold                 PT Education - 01/04/16 1714    Education provided Yes   Education Details Improtance of symptom mangement, core strengthening   Person(s) Educated Patient   Methods Explanation;Demonstration   Comprehension Verbalized understanding;Returned demonstration;Verbal cues required          PT Short Term Goals - 01/04/16 1949      PT SHORT TERM GOAL #1   Title Patient will increase core strength to good    Time 4   Period Weeks   Status New     PT SHORT TERM GOAL #2   Title Patient will report 3/10 pain at worst    Time 4   Period Weeks   Status New     PT SHORT TERM GOAL #3   Title Patient will lie flat on the table for30 minutes in order to work on core stregthening     Time 4   Period Weeks   Status New     PT SHORT TERM GOAL #4   Title Patient will be independent with inital HEP    Time 4   Period Weeks  Status New           PT Long Term Goals - 01/04/16 1954      PT LONG TERM GOAL #1   Title Patient will be independent with complete  program for stretching and strengthening   Time 8   Period Weeks   Status New     PT LONG TERM GOAL #2   Title Patient will stand at work for 2 hours without increased pain    Time 8   Period Weeks   Status New     PT LONG TERM GOAL #3   Title Patient will lie flat on her back in bed without increased pain   Time 8   Period Weeks   Status New     PT LONG TERM GOAL #4   Title Patient will demsotrate 5/5 right lower extremity strength    Time 8   Period Weeks   Status New               Plan - 01/04/16 1928    Clinical Impression Statement Patient is a 50 year old female with right sided lower back pain that increases when she stands at work, and when she does house work. She has core weakness and right lower extremity weakness. 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 She is unable at this time to lie on her back or stomach for any prolonged perios of time.    Rehab Potential Good   PT Frequency 2x / week   PT Duration 8 weeks   PT Treatment/Interventions ADLs/Self Care Home Management;Cryotherapy;Electrical Stimulation;Iontophoresis 4mg /ml Dexamethasone;Moist Heat;Ultrasound;Stair training;Gait training;Therapeutic exercise;Therapeutic activities;Patient/family education;Neuromuscular re-education;Manual techniques;Energy conservation   PT Next Visit Plan Continue with stregthening exercises. Progress patient slowly. Consider seated ball lift. Consider yellow band pallof press. Consider light manual trigger point release in side lying.    PT Home Exercise Plan Patient advised to do 3 exercises band extension, band retraction, supine clamshell, supine abd contraction to neutral spine, piriformis stretch    Consulted and Agree with Plan of Care Patient      Patient will benefit from skilled therapeutic intervention in order to improve the following deficits and impairments:  Obesity, Pain, Decreased  strength, Decreased mobility, Difficulty walking, Decreased endurance, Postural dysfunction  Visit Diagnosis: Chronic right-sided low back pain without sciatica - Plan: PT plan of care cert/re-cert  Muscle weakness (generalized) - Plan: PT plan of care cert/re-cert     Problem List Patient Active Problem List   Diagnosis Date Noted  . 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 TOBACCO ABUSE 03/31/2009  . HEMORRHOIDS, EXTERNAL 03/31/2009  . FATIGUE 03/31/2009    Carney Living PT DPT  01/04/2016, 8:15 PM  [facility] [address] [place], [place], [address] Phone: [phone]   Fax:  [phone]  Name: TAHINA MCKANE MRN: [id number] Date of Birth: December 07, 1965

## 2016-01-12 ENCOUNTER — Ambulatory Visit: Payer: 59 | Admitting: Physical Therapy

## 2016-01-15 ENCOUNTER — Ambulatory Visit: Payer: 59 | Attending: Family Medicine | Admitting: Physical Therapy

## 2016-01-15 DIAGNOSIS — M545 Low back pain: Secondary | ICD-10-CM | POA: Insufficient documentation

## 2016-01-15 DIAGNOSIS — G8929 Other chronic pain: Secondary | ICD-10-CM | POA: Diagnosis not present

## 2016-01-15 DIAGNOSIS — M6281 Muscle weakness (generalized): Secondary | ICD-10-CM | POA: Insufficient documentation

## 2016-01-15 NOTE — Therapy (Signed)
[place], [place], [address] Phone: [phone]   Fax:  [phone]  Physical Therapy Treatment  Patient Details  Name: Candice Pratt MRN: [id number] Date of Birth: 1965/06/08 Referring Provider: Dr Reginia Forts   Encounter Date: 01/15/2016      PT End of Session - 01/15/16 1306    Visit Number 2   Number of Visits 16   Date for PT Re-Evaluation 03/01/15   Authorization Type [place] [employer]    PT Start Time 1104   PT Stop Time 1144   PT Time Calculation (min) 40 min   Activity Tolerance Patient tolerated treatment well   Behavior During Therapy [facility] for tasks assessed/performed      Past Medical History:  Diagnosis Date  . Asthma   . GERD (gastroesophageal reflux disease)   . Hiatal hernia     Past Surgical History:  Procedure Laterality Date  . KNEE SURGERY      There were no vitals filed for this visit.      Subjective Assessment - 01/15/16 1304    Subjective Patient has been sick. She has been able to do her exercises about every other day. She has not been working so she is having very little lower back pain,    How long can you sit comfortably? No limit sitting    How long can you stand comfortably? 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 Yes   Pain Score 6    Pain Location Back   Pain Orientation Right;Left   Pain Descriptors / Indicators Aching;Sharp   Pain Type Acute pain   Pain Onset More than a month ago   Pain Frequency Several days a week   Aggravating Factors  standing at work, lying flat prone or supine    Pain Relieving Factors rest    Effect of Pain on Daily Activities difficulty perfroming ADL's                                    PT Short Term Goals - 01/15/16 1312      PT  SHORT TERM GOAL #1   Title Patient will increase core strength to good    Time 4   Period Weeks   Status On-going     PT SHORT TERM GOAL #2   Title Patient will report 3/10 pain at worst    Baseline has not been back to work    Time 4   Period Weeks   Status On-going     PT SHORT TERM GOAL #3   Title Patient will lie flat on the table for30 minutes in order to work on core stregthening     Time 4   Period Weeks   Status On-going     PT SHORT TERM GOAL #4   Title Patient will be independent with inital HEP    Time 4   Period Weeks   Status On-going           PT Long Term Goals - 01/04/16 1954      PT LONG TERM GOAL #1   Title Patient will be independent with complete program for stretching and strengthening   Time 8  Period Weeks   Status New     PT LONG TERM GOAL #2   Title Patient will stand at work for 2 hours without increased pain    Time 8   Period Weeks   Status New     PT LONG TERM GOAL #3   Title Patient will lie flat on her back in bed without increased pain   Time 8   Period Weeks   Status New     PT LONG TERM GOAL #4   Title Patient will demsotrate 5/5 right lower extremity strength    Time 8   Period Weeks   Status New               Plan - 01/15/16 1308    Clinical Impression Statement Patient continue toh ave some difficulty with poitioning. She was able to lie supine for 7 minutes while therapy worked on manual therapy but she had increased pain when she stood up. Her pain went away after moving around for a few seconds.     Rehab Potential Good   PT Frequency 2x / week   PT Duration 8 weeks   PT Treatment/Interventions ADLs/Self Care Home Management;Cryotherapy;Electrical Stimulation;Iontophoresis 4mg /ml Dexamethasone;Moist Heat;Ultrasound;Stair training;Gait training;Therapeutic exercise;Therapeutic activities;Patient/family education;Neuromuscular re-education;Manual techniques;Energy conservation   PT Next Visit Plan Continue  with stregthening exercises. Progress patient slowly. Consider seated ball lift. Consider yellow band pallof press. Consider light manual trigger point release in side lying.    PT Home Exercise Plan Patient advised to do 3 exercises band extension, band retraction, supine clamshell, supine abd contraction to neutral spine, piriformis stretch    Consulted and Agree with Plan of Care Patient      Patient will benefit from skilled therapeutic intervention in order to improve the following deficits and impairments:  Obesity, Pain, Decreased strength, Decreased mobility, Difficulty walking, Decreased endurance, Postural dysfunction  Visit Diagnosis: Chronic right-sided low back pain without sciatica  Muscle weakness (generalized)     Problem List Patient Active Problem List   Diagnosis Date Noted  . 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 TOBACCO ABUSE 03/31/2009  . HEMORRHOIDS, EXTERNAL 03/31/2009  . FATIGUE 03/31/2009    Carney Living PT DPT  01/15/2016, 1:15 PM  [facility] [address] [place], [place], [address] Phone: [phone]   Fax:  [phone]  Name: Candice Pratt MRN: [id number] Date of Birth: May 06, 1965

## 2016-01-22 ENCOUNTER — Telehealth: Payer: Self-pay | Admitting: Family Medicine

## 2016-01-22 NOTE — Telephone Encounter (Signed)
Candice Pratt, Pt states that the prednisone worked for her lips being swollen but the allergy medicine didn't work and her lips has swollen back up within the last three days she would like for you to refer her to a specialist and refill prednisone again please respond

## 2016-01-23 ENCOUNTER — Ambulatory Visit: Payer: 59 | Admitting: Physical Therapy

## 2016-01-23 NOTE — Telephone Encounter (Signed)
Routed to clark. Does pt need to RTC?

## 2016-01-24 ENCOUNTER — Other Ambulatory Visit: Payer: Self-pay | Admitting: Physician Assistant

## 2016-01-24 DIAGNOSIS — R220 Localized swelling, mass and lump, head: Secondary | ICD-10-CM

## 2016-01-24 MED ORDER — PREDNISONE 50 MG PO TABS: ORAL_TABLET | ORAL | 0 refills | Status: DC

## 2016-01-24 NOTE — Telephone Encounter (Signed)
Darren,  Please call patient.  I don't want her taking prednisone unless her lips, throat or tongue are swelling.  Advise that she try to avoid pred  (as it is not without side effects) and stay on zyrtec if possible.  I have referred her allergy.

## 2016-02-12 ENCOUNTER — Ambulatory Visit (INDEPENDENT_AMBULATORY_CARE_PROVIDER_SITE_OTHER): Payer: 59 | Admitting: Physician Assistant

## 2016-02-12 VITALS — BP 130/65 | HR 82 | Temp 98.7°F | Resp 16 | Ht 71.0 in | Wt 182.0 lb

## 2016-02-12 DIAGNOSIS — R220 Localized swelling, mass and lump, head: Secondary | ICD-10-CM | POA: Diagnosis not present

## 2016-02-12 MED ORDER — PREDNISONE 10 MG PO TABS: ORAL_TABLET | ORAL | 0 refills | Status: DC

## 2016-02-12 NOTE — Patient Instructions (Signed)
     IF you received an x-ray today, you will receive an invoice from [facility]. Please contact [facility] at [phone] with questions or concerns regarding your invoice.   IF you received labwork today, you will receive an invoice from [facility]. Please contact [facility] at [phone] with questions or concerns regarding your invoice.   Our billing staff will not be able to assist you with questions regarding bills from these companies.  You will be contacted with the lab results as soon as they are available. The fastest way to get your results is to activate your My Chart account. Instructions are located on the last page of this paperwork. If you have not heard from us regarding the results in 2 weeks, please contact this office.     

## 2016-02-12 NOTE — Progress Notes (Signed)
Candice Pratt  MRN: [id number] DOB: [birth year]  PCP: Wendie Agreste, MD  Subjective:  Pt is a pleasant 51 year old female presents to clinic for lip swelling.   She was here 12/30/2015 for the same. Advised to take Zyrtec qhs, if not working Rx Prednisode 50mg  PO x 5 days. Rx Epipen. No improvement w Zyrtec. Prednisode helped. She was better for a few weeks, however her symptoms returned. Dec 22 called in more prednisone Dec 28th all better. Her second dose she actually took 25mg  Prednisone, not 50mg  as prescribed. Jan 5th swelling is back.    Worse in the morning. Some days are worse than others.  +Puffy, cracked, tingling, painful lips. "I look like Selena Batten" No tongue involvement. She has tried changing everything including bed sheets, pillow cases, lip balm, lipstick, toothbrush, toothpaste. Has not noted association with foods.  No known allergies.  Denies difficulty breathing, SOB, chest pain, abdominal pain, nausea, vomiting.  She has a Feb 2 allergist appt.   Review of Systems  Constitutional: Negative for chills, diaphoresis, fatigue and fever.  HENT: Negative for congestion, postnasal drip, rhinorrhea, sinus pressure, sneezing and sore throat.   Respiratory: Negative for cough, chest tightness, shortness of breath and wheezing.   Cardiovascular: Negative for chest pain and palpitations.  Gastrointestinal: Negative for abdominal pain, diarrhea, nausea and vomiting.  Skin: Positive for rash.  Neurological: Negative for weakness, light-headedness and headaches.    Patient Active Problem List   Diagnosis Date Noted  . 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 TOBACCO ABUSE 03/31/2009  . HEMORRHOIDS, EXTERNAL 03/31/2009  . FATIGUE 03/31/2009    Current Outpatient Prescriptions on File Prior to Visit  Medication Sig Dispense Refill  . levonorgestrel (MIRENA) 20 MCG/24HR IUD 1 each by Intrauterine route once.    . methocarbamol (ROBAXIN-750) 750 MG tablet Take 1  tablet (750 mg total) by mouth at bedtime as needed for muscle spasms. 30 tablet 2  . naproxen sodium (ANAPROX) 220 MG tablet Take 220 mg by mouth 1 day or 1 dose.    . predniSONE (DELTASONE) 50 MG tablet Take one daily for five days. (Patient not taking: Reported on 02/12/2016) 5 tablet 0  . terbinafine (LAMISIL) 250 MG tablet Take 1 tablet (250 mg total) by mouth daily. (Patient not taking: Reported on 02/12/2016) 30 tablet 2   No current facility-administered medications on file prior to visit.     No Known Allergies   Objective:  BP 130/65   Pulse 82   Temp 98.7 F (37.1 C) (Oral)   Resp 16   Ht 5\' 11"  (1.803 m)   Wt 182 lb (82.6 kg)   SpO2 96%   BMI 25.38 kg/m   Physical Exam  Constitutional: She is oriented to person, place, and time and well-developed, well-nourished, and in no distress. No distress.  HENT:  Mouth/Throat: Uvula is midline, oropharynx is clear and moist and mucous membranes are normal.    Lower lip swelling. Mild erythema inferior to [place] border. No involvement of oropharynx, tongue or buccal mucosa.   Cardiovascular: Normal rate, regular rhythm and normal heart sounds.   Neurological: She is alert and oriented to person, place, and time. GCS score is 15.  Skin: Skin is warm and dry.  Psychiatric: Mood, memory, affect and judgment normal.  Vitals reviewed.   Assessment and Plan :  1. Swelling of both lips 2. Lip swelling - predniSONE (DELTASONE) 10 MG tablet; Take 30mg  PO day one, 20mg  PO x 2 days,  10 mg PO x 2 days.  Dispense: 15 tablet; Refill: 0 - Low-dose steroid taper. Advised pt to keep appt with allergist next month. RTC if symptoms return. Discussed symptoms that should lead her to the emergency department.    Mercer Pod, PA-C  [facility] 02/12/2016 12:07 PM

## 2016-02-15 ENCOUNTER — Ambulatory Visit: Payer: 59 | Attending: Family Medicine | Admitting: Physical Therapy

## 2016-02-15 DIAGNOSIS — M545 Low back pain: Secondary | ICD-10-CM | POA: Diagnosis not present

## 2016-02-15 DIAGNOSIS — G8929 Other chronic pain: Secondary | ICD-10-CM | POA: Insufficient documentation

## 2016-02-15 DIAGNOSIS — M6281 Muscle weakness (generalized): Secondary | ICD-10-CM | POA: Insufficient documentation

## 2016-02-16 NOTE — Therapy (Signed)
[place], [place], [address] Phone: [phone]   Fax:  [phone]  Physical Therapy Treatment  Patient Details  Name: Candice Pratt MRN: [id number] Date of Birth: March 07, 1965 Referring Provider: Dr Reginia Forts   Encounter Date: 02/15/2016      PT End of Session - 02/15/16 1556    Visit Number 3   Number of Visits 16   Date for PT Re-Evaluation 03/01/15   Authorization Type [place] [employer]    PT Start Time [id number]   PT Stop Time 1624   PT Time Calculation (min) 39 min   Activity Tolerance Patient tolerated treatment well   Behavior During Therapy [facility] for tasks assessed/performed      Past Medical History:  Diagnosis Date  . Asthma   . GERD (gastroesophageal reflux disease)   . Hiatal hernia     Past Surgical History:  Procedure Laterality Date  . KNEE SURGERY      There were no vitals filed for this visit.      Subjective Assessment - 02/16/16 0743    Subjective Therapy added higher level exercises. She had no increase in pain. She did have pain with SLR but exercise stopped. Therapy also added in rotational control exercises. She had no increase inpain. Patient will come back in 1 month for a follow up when she is off prednisone.    How long can you sit comfortably? No limit sitting    How long can you stand comfortably? 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 Yes   Pain Score 2    Pain Location Back   Pain Orientation Right;Left   Pain Descriptors / Indicators Aching;Sharp   Pain Type Acute pain   Pain Onset More than a month ago   Pain Frequency Several days a week   Aggravating Factors  standing at work, lying gflat or prone    Pain Relieving Factors rest   Effect of Pain on Daily Activities difficulty perfroming ADL's                           [facility] Adult PT Treatment/Exercise - 02/15/16 0001      Knee/Hip Exercises: Stretches   Other Knee/Hip Stretches lateral trunk rotation x10;    Other Knee/Hip Stretches piriformis stretch                 PT Education - 02/16/16 0756    Education provided Yes   Education Details improtance of symptom management    Person(s) Educated Patient   Methods Explanation;Demonstration   Comprehension Verbalized understanding;Returned demonstration;Verbal cues required          PT Short Term Goals - 01/15/16 1312      PT SHORT TERM GOAL #1   Title Patient will increase core strength to good    Time 4   Period Weeks   Status On-going     PT SHORT TERM GOAL #2   Title Patient will report 3/10 pain at worst    Baseline has not been back to work    Time 4   Period Weeks   Status On-going     PT SHORT TERM GOAL #3   Title Patient will lie flat on the table for30  minutes in order to work on core stregthening     Time 4   Period Weeks   Status On-going     PT SHORT TERM GOAL #4   Title Patient will be independent with inital HEP    Time 4   Period Weeks   Status On-going           PT Long Term Goals - 01/04/16 1954      PT LONG TERM GOAL #1   Title Patient will be independent with complete program for stretching and strengthening   Time 8   Period Weeks   Status New     PT LONG TERM GOAL #2   Title Patient will stand at work for 2 hours without increased pain    Time 8   Period Weeks   Status New     PT LONG TERM GOAL #3   Title Patient will lie flat on her back in bed without increased pain   Time 8   Period Weeks   Status New     PT LONG TERM GOAL #4   Title Patient will demsotrate 5/5 right lower extremity strength    Time 8   Period Weeks   Status New               Plan - 02/16/16 0758    Clinical Impression Statement Patient is making progress. She is having no pain at work. She is doing alll her  exercises daily. She continues to have pain at times but they are less frequent. She is also on high doses of prednisone so this may be helping her pain.    PT Frequency 2x / week   PT Duration 8 weeks   PT Treatment/Interventions ADLs/Self Care Home Management;Cryotherapy;Electrical Stimulation;Iontophoresis 4mg /ml Dexamethasone;Moist Heat;Ultrasound;Stair training;Gait training;Therapeutic exercise;Therapeutic activities;Patient/family education;Neuromuscular re-education;Manual techniques;Energy conservation   PT Next Visit Plan Continue with stregthening exercises. Progress patient slowly. Consider seated ball lift. Consider yellow band pallof press. Consider light manual trigger point release in side lying.    PT Home Exercise Plan Patient advised to do 3 exercises band extension, band retraction, supine clamshell, supine abd contraction to neutral spine, piriformis stretch    Consulted and Agree with Plan of Care Patient      Patient will benefit from skilled therapeutic intervention in order to improve the following deficits and impairments:     Visit Diagnosis: Chronic right-sided low back pain without sciatica  Muscle weakness (generalized)     Problem List Patient Active Problem List   Diagnosis Date Noted  . 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 TOBACCO ABUSE 03/31/2009  . HEMORRHOIDS, EXTERNAL 03/31/2009  . FATIGUE 03/31/2009    Carney Living PT DPT  02/16/2016, 8:00 AM  [facility] [address] [place], [place], [address] Phone: [phone]   Fax:  [phone]  Name: Candice Pratt MRN: [id number] Date of Birth: [birth year]

## 2016-02-23 DIAGNOSIS — Z30433 Encounter for removal and reinsertion of intrauterine contraceptive device: Secondary | ICD-10-CM | POA: Diagnosis not present

## 2016-02-23 DIAGNOSIS — N8111 Cystocele, midline: Secondary | ICD-10-CM | POA: Diagnosis not present

## 2016-02-23 DIAGNOSIS — Z1231 Encounter for screening mammogram for malignant neoplasm of breast: Secondary | ICD-10-CM | POA: Diagnosis not present

## 2016-02-23 DIAGNOSIS — Z01419 Encounter for gynecological examination (general) (routine) without abnormal findings: Secondary | ICD-10-CM | POA: Diagnosis not present

## 2016-02-23 DIAGNOSIS — Z1389 Encounter for screening for other disorder: Secondary | ICD-10-CM | POA: Diagnosis not present

## 2016-02-23 DIAGNOSIS — Z6824 Body mass index (BMI) 24.0-24.9, adult: Secondary | ICD-10-CM | POA: Diagnosis not present

## 2016-02-23 DIAGNOSIS — R3121 Asymptomatic microscopic hematuria: Secondary | ICD-10-CM | POA: Diagnosis not present

## 2016-02-23 DIAGNOSIS — Z130 Encounter for screening for diseases of the blood and blood-forming organs and certain disorders involving the immune mechanism: Secondary | ICD-10-CM | POA: Diagnosis not present

## 2016-02-23 DIAGNOSIS — N393 Stress incontinence (female) (male): Secondary | ICD-10-CM | POA: Diagnosis not present

## 2016-03-01 ENCOUNTER — Other Ambulatory Visit: Payer: Self-pay | Admitting: Family Medicine

## 2016-03-01 DIAGNOSIS — R220 Localized swelling, mass and lump, head: Secondary | ICD-10-CM

## 2016-03-01 NOTE — Telephone Encounter (Signed)
Patient calling for a refill on Prednisone stating that she finished the prednisone on Sunday and her lip swelled back up on Tuesday she has an appointment to see an allergist on feb 2nd please send to [facility] on [address] she has seen Carlis Abbott and McVey for this issue

## 2016-03-01 NOTE — Telephone Encounter (Signed)
Please refill as appropriate or advise

## 2016-03-02 MED ORDER — PREDNISONE 10 MG PO TABS: ORAL_TABLET | ORAL | 0 refills | Status: DC

## 2016-03-02 NOTE — Telephone Encounter (Signed)
Pt calling back in about refill. Has an apt with an allergist on Friday Feb 2nd and is looking for this refill just to control symptoms until that apt. Santiago Glad to address today. Thanks!

## 2016-03-02 NOTE — Telephone Encounter (Signed)
Meds ordered this encounter  Medications  . predniSONE (DELTASONE) 10 MG tablet    Sig: Take 30mg  PO day one, 20mg  PO x 2 days, 10 mg PO x 2 days.    Dispense:  15 tablet    Refill:  0    Order Specific Question:   Supervising Provider    Answer:   Brigitte Pulse, EVA N [[id number]]

## 2016-03-02 NOTE — Telephone Encounter (Signed)
I have called patient to advise this has been sent

## 2016-03-02 NOTE — Telephone Encounter (Signed)
Candice Pratt is out of office today, can a partner address?

## 2016-03-08 DIAGNOSIS — J3089 Other allergic rhinitis: Secondary | ICD-10-CM | POA: Diagnosis not present

## 2016-03-08 DIAGNOSIS — T783XXA Angioneurotic edema, initial encounter: Secondary | ICD-10-CM | POA: Diagnosis not present

## 2016-03-14 ENCOUNTER — Encounter: Payer: Self-pay | Admitting: Physical Therapy

## 2016-03-14 ENCOUNTER — Ambulatory Visit: Payer: 59 | Attending: Family Medicine | Admitting: Physical Therapy

## 2016-03-14 ENCOUNTER — Telehealth: Payer: Self-pay | Admitting: *Deleted

## 2016-03-14 DIAGNOSIS — G8929 Other chronic pain: Secondary | ICD-10-CM | POA: Insufficient documentation

## 2016-03-14 DIAGNOSIS — M545 Low back pain, unspecified: Secondary | ICD-10-CM

## 2016-03-14 DIAGNOSIS — M6281 Muscle weakness (generalized): Secondary | ICD-10-CM | POA: Insufficient documentation

## 2016-03-14 NOTE — Telephone Encounter (Signed)
Open in error

## 2016-03-15 NOTE — Therapy (Signed)
[place], [place], [address] Phone: [phone]   Fax:  [phone]  Physical Therapy Treatment  Patient Details  Name: Candice Pratt MRN: [id number] Date of Birth: 12-09-1965 Referring Provider: Dr Reginia Forts   Encounter Date: 03/14/2016      PT End of Session - 03/14/16 2122    Visit Number 4   Number of Visits 16   Date for PT Re-Evaluation 03/01/15   PT Start Time 1633   PT Stop Time 1714   PT Time Calculation (min) 41 min   Activity Tolerance Patient tolerated treatment well   Behavior During Therapy [facility] for tasks assessed/performed      Past Medical History:  Diagnosis Date  . Asthma   . GERD (gastroesophageal reflux disease)   . Hiatal hernia     Past Surgical History:  Procedure Laterality Date  . KNEE SURGERY      There were no vitals filed for this visit.      Subjective Assessment - 03/14/16 2115    Subjective Patient reported that her back has only hurt when she had to work with a doctor who was short. She has been doing her exercises. She also continues to be on prednisone.    How long can you sit comfortably? No limit sitting    How long can you stand comfortably? 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 Yes   Pain Score 1    Pain Location Back   Pain Orientation Right;Left   Pain Descriptors / Indicators Aching;Sharp   Pain Type Acute pain   Pain Onset More than a month ago   Pain Frequency Several days a week   Aggravating Factors  standing at work    Pain Relieving Factors rest   Effect of Pain on Daily Activities difficulty perfroming ADL's    Multiple Pain Sites No                         [facility] Adult PT Treatment/Exercise - 03/15/16 0001      Lumbar Exercises: Seated   Other Seated  Lumbar Exercises seated on ball scap retraction and extension 2x10; oppistie arm/ opposite leg.      Lumbar Exercises: Supine   Other Supine Lumbar Exercises doube knee to chest wih ball 2x10      Knee/Hip Exercises: Machines for Strengthening   Other Machine row machine 2x10 35 # with bracing lat pull down 2x10 35#                 PT Education - 03/14/16 2122    Education provided Yes   Education Details improtance of symptom mangement    Person(s) Educated Patient   Methods Explanation;Demonstration   Comprehension Verbalized understanding;Returned demonstration;Verbal cues required          PT Short Term Goals - 03/15/16 0735      PT SHORT TERM GOAL #1   Title Patient will increase core strength to good    Baseline perfroming all exercises without difficulty    Time 4   Period Weeks   Status Achieved     PT SHORT TERM GOAL #2   Title Patient will report 3/10 pain at worst    Baseline has not been back  to work    Time 4   Period Weeks   Status Achieved     PT SHORT TERM GOAL #3   Title Patient will lie flat on the table for 30 minutes in order to work on core stregthening     Baseline can lie flat but still havs some minor pain    Time 4   Period Weeks   Status Partially Met     PT SHORT TERM GOAL #4   Title Patient will be independent with inital HEP    Time 4   Period Weeks   Status Achieved           PT Long Term Goals - 03/15/16 2993      PT LONG TERM GOAL #1   Title Patient will be independent with complete program for stretching and strengthening   Baseline perfroming all exercises. Has a good program that will allow her to progress and work on different exercises.    Time 8   Period Weeks   Status On-going     PT LONG TERM GOAL #2   Title Patient will stand at work for 2 hours without increased pain    Time 8   Period Weeks   Status Achieved     PT LONG TERM GOAL #3   Title Patient will lie flat on her back in bed without  increased pain   Time 8   Period Weeks   Status Achieved     PT LONG TERM GOAL #4   Title Patient will demsotrate 5/5 right lower extremity strength    Baseline 5/5 bilateral lower extremity strength    Time 8   Period Weeks   Status Achieved               Plan - 03/15/16 0732    Clinical Impression Statement Patient has progressed well. She will continue to work on exercises. Therapy reviewed ball exercises and gym exercises with core tightneing. She feels comfortable with her exercises. She will discharge at this time. If she has pain when shegets off prednisone she will return to therapy for more agressive manual therapy. D?C to HEP    Rehab Potential Good   PT Frequency 2x / week   PT Duration 8 weeks   PT Treatment/Interventions ADLs/Self Care Home Management;Cryotherapy;Electrical Stimulation;Iontophoresis 17m/ml Dexamethasone;Moist Heat;Ultrasound;Stair training;Gait training;Therapeutic exercise;Therapeutic activities;Patient/family education;Neuromuscular re-education;Manual techniques;Energy conservation   PT Next Visit Plan Continue with stregthening exercises. Progress patient slowly. Consider seated ball lift. Consider yellow band pallof press. Consider light manual trigger point release in side lying.    PT Home Exercise Plan Patient advised to do 3 exercises band extension, band retraction, supine clamshell, supine abd contraction to neutral spine, piriformis stretch    Consulted and Agree with Plan of Care Patient      Patient will benefit from skilled therapeutic intervention in order to improve the following deficits and impairments:  Obesity, Pain, Decreased strength, Decreased mobility, Difficulty walking, Decreased endurance, Postural dysfunction  Visit Diagnosis: Muscle weakness (generalized)  Chronic right-sided low back pain without sciatica  PHYSICAL THERAPY DISCHARGE SUMMARY  Visits from Start of Care: 3  Current functional level related to goals  / functional outcomes: Significant improvement in pain    Remaining deficits: Pain at times when she is standing for several hours.    Education / Equipment: HEP  Plan: Patient agrees to discharge.  Patient goals were met. Patient is being discharged due to meeting the stated rehab goals.  ?????  Problem List Patient Active Problem List   Diagnosis Date Noted  . 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 TOBACCO ABUSE 03/31/2009  . HEMORRHOIDS, EXTERNAL 03/31/2009  . FATIGUE 03/31/2009    Carney Living PT DPT  03/15/2016, 7:48 AM  [facility] [address] [place], [place], [address] Phone: [phone]   Fax:  [phone]  Name: Candice Pratt MRN: [id number] Date of Birth: [birth year]

## 2016-04-19 DIAGNOSIS — Z30431 Encounter for routine checking of intrauterine contraceptive device: Secondary | ICD-10-CM | POA: Diagnosis not present

## 2016-04-19 DIAGNOSIS — N898 Other specified noninflammatory disorders of vagina: Secondary | ICD-10-CM | POA: Diagnosis not present

## 2016-05-13 MED FILL — METHOCARBAMOL 750 MG TABLET: 750 | 30 days supply | Qty: 30 | Fill #1

## 2016-06-03 MED FILL — TIVICAY 50 MG TABLET: 50 | 30 days supply | Qty: 30 | Fill #0

## 2016-06-03 MED FILL — DESCOVY 200-25 MG TABS: 200-25 | 30 days supply | Qty: 30 | Fill #0

## 2016-06-12 ENCOUNTER — Ambulatory Visit (INDEPENDENT_AMBULATORY_CARE_PROVIDER_SITE_OTHER): Payer: 59 | Admitting: Physician Assistant

## 2016-06-12 ENCOUNTER — Encounter: Payer: Self-pay | Admitting: Physician Assistant

## 2016-06-12 VITALS — BP 127/78 | HR 70 | Temp 98.0°F | Resp 18 | Ht 71.0 in | Wt 173.0 lb

## 2016-06-12 DIAGNOSIS — F411 Generalized anxiety disorder: Secondary | ICD-10-CM

## 2016-06-12 DIAGNOSIS — F410 Panic disorder [episodic paroxysmal anxiety] without agoraphobia: Secondary | ICD-10-CM | POA: Diagnosis not present

## 2016-06-12 DIAGNOSIS — F4310 Post-traumatic stress disorder, unspecified: Secondary | ICD-10-CM | POA: Diagnosis not present

## 2016-06-12 MED ORDER — FLUOXETINE HCL 10 MG PO TABS: 10.0000 mg | ORAL_TABLET | Freq: Every day | ORAL | 3 refills | Status: DC

## 2016-06-12 MED ORDER — HYDROXYZINE HCL 50 MG PO TABS: 50.0000 mg | ORAL_TABLET | Freq: Four times a day (QID) | ORAL | 1 refills | Status: DC | PRN

## 2016-06-12 NOTE — Patient Instructions (Addendum)
  Prozac Start 78m in the morning for 1 week. You may increase the dose by 10 mg/day the next week.  Please remember we need to taper you off this medication when you decide to discontinue.   Hydroxyzine  Take this as directed until the Prozac takes effect.   Please come back and see me in 3-4 weeks.   Thank you for coming in today. I hope you feel we met your needs.  Feel free to call [facility] if you have any questions or further requests.  Please consider signing up for MyChart if you do not already have it, as this is a great way to communicate with me.  Best,  Whitney McVey, PA-C  IF you received an x-ray today, [facility]. Please contact [facility] at [phone] questions or concerns regarding your invoice.   IF you received labwork today, you will receive an invoice from [facility] Please contact [facility] at [phone] questions or concerns regarding your invoice.   Our billing staff will not be able to assist you with questions regarding bills from these companies.  You will be contacted with the lab results as soon as they are available. The fastest way to get your results is to activate your My Chart account. Instructions are located on the last page of this paperwork. If you have not heard from [place] the results in 2 weeks, please contact this office.

## 2016-06-12 NOTE — Progress Notes (Signed)
Candice Pratt  MRN: [id number] DOB: 1965/10/23  PCP: Wendie Agreste, MD  Subjective:  Pt is a pleasant 51 year old female who presents to clinic for anxiety x 10 days.  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. 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 Blood squirted out of the patient's carotid artery over her face shield into her eye. She went through proper protocol and is on HIV prophylaxis now.  Since this incident she endorses a high level of anxiety with returning to work. Today was worse - she cried for 3 hours at work.  "I am afraid of every fluid in their body."  It has been really profound and I am surprised by this reaction. This is throwing me through such a loop."   She is seeing behavioral therapist at employee health. Her supervisor is allowing her to have lighter work load, less involved cases, more break time. While she appreciates this, it is not decreasing her anxiety.  She is getting married in July - she has strict sexual precautions until Nov. Her fiance is very understanding about the situation. Her first results come in in 6 weeks. She gets definitive HIV results in 6 months.   Review of Systems  Respiratory: Negative for cough, chest tightness, shortness of breath and wheezing.   Cardiovascular: Negative for chest pain and palpitations.  Gastrointestinal: Negative for abdominal pain, diarrhea, nausea and vomiting.  Neurological: Negative for weakness, light-headedness and headaches.  Psychiatric/Behavioral: The patient is nervous/anxious.     Patient Active Problem List   Diagnosis Date Noted  . 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 TOBACCO ABUSE 03/31/2009  . HEMORRHOIDS, EXTERNAL 03/31/2009  . FATIGUE 03/31/2009    Current Outpatient Prescriptions on File Prior to Visit  Medication Sig Dispense Refill  . levonorgestrel (MIRENA) 20 MCG/24HR IUD 1 each by Intrauterine route once.    . methocarbamol (ROBAXIN-750) 750  MG tablet Take 1 tablet (750 mg total) by mouth at bedtime as needed for muscle spasms. 30 tablet 2  . naproxen sodium (ANAPROX) 220 MG tablet Take 220 mg by mouth 1 day or 1 dose.    . terbinafine (LAMISIL) 250 MG tablet Take 1 tablet (250 mg total) by mouth daily. 30 tablet 2  . predniSONE (DELTASONE) 10 MG tablet Take 30mg  PO day one, 20mg  PO x 2 days, 10 mg PO x 2 days. (Patient not taking: Reported on 06/12/2016) 15 tablet 0   No current facility-administered medications on file prior to visit.     No Known Allergies   Objective:  BP 127/78 (BP Location: Right Arm, Patient Position: Sitting, Cuff Size: Small)   Pulse 70   Temp 98 F (36.7 C) (Oral)   Resp 18   Ht 5\' 11"  (1.803 m)   Wt 173 lb (78.5 kg)   SpO2 98%   BMI 24.13 kg/m   Physical Exam  Constitutional: She is oriented to person, place, and time and well-developed, well-nourished, and in no distress. No distress.  Cardiovascular: Normal rate, regular rhythm and normal heart sounds.   Neurological: She is alert and oriented to person, place, and time. GCS score is 15.  Skin: Skin is warm and dry.  Psychiatric: Memory, affect and judgment normal. Her mood appears anxious.  Vitals reviewed.   Assessment and Plan :  1. Post-traumatic stress reaction 2. Anxiety state 3. Panic disorder - hydrOXYzine (ATARAX/VISTARIL) 50 MG tablet; Take 1-2 tablets (50-100 mg total) by  mouth every 6 (six) hours as needed for anxiety.  Dispense: 90 tablet; Refill: 1 - FLUoxetine (PROZAC) 10 MG tablet; Take 1 tablet (10 mg total) by mouth daily. Take 10mg  PO q am x 1 week; May increase dose by 10mg /day q weekly  Dispense: 45 tablet; Refill: 3 - Pt is naive to SSRI's. Discussed at length with pt need to titrate up and down from these medications. Side effect profile discussed. Will start Atarax until Prozac takes effect. She understands. Con't behavioral therapy. RTC in 3-4 weeks for recheck.   Mercer Pod, PA-C  Primary Care at  [place] 06/12/2016 5:00 PM

## 2016-06-17 ENCOUNTER — Telehealth: Payer: Self-pay | Admitting: Physician Assistant

## 2016-06-17 NOTE — Telephone Encounter (Signed)
Patient needs her FMLA forms completed by Mercer Pod, for her PTSD that she was seen for on 06-12-16. I have completed what I could from the [facility] notes and highlighted the areas I wasn't sure about. I will place the forms in your box on 06/17/16 please return them to the FMLA/Disability box at the 102 checkout desk within 5-7 business days. Thank you!

## 2016-06-19 NOTE — Telephone Encounter (Signed)
THANK YOU FOR FILLING THIS OUT!!! Forms placed in FMLA box.  Whitney

## 2016-06-20 NOTE — Telephone Encounter (Signed)
Paperwork scanned and faxed to [employer] on 06/20/16

## 2016-06-24 ENCOUNTER — Telehealth: Payer: Self-pay | Admitting: Physician Assistant

## 2016-06-24 NOTE — Telephone Encounter (Signed)
[employer] needs forms completed on this patient following her last OV for PTSD I tried to complete these but figured it would be better for the PA to do this since I wasn't in the room with her and wasn't sure about all her conditions of her PTSD. I will place the forms in Whitney McVey's box on 06/24/16 if you could please return them to the FMLA/Disability box at that [employer] checkout desk within 5-7 business days. Thank you!

## 2016-06-26 NOTE — Telephone Encounter (Signed)
I recall this pt asking me to not fill out disability forms. I called and left VM asking her to confirm. I will leave these forms in my box until we hear back from her.  THANK YOU!

## 2016-07-02 NOTE — Telephone Encounter (Signed)
Have you heard anything back from the patient about these forms? Candice Pratt has called my voicemail twice to check the status of them and I wasn't sure what to tell them.

## 2016-07-04 NOTE — Telephone Encounter (Signed)
I've left 2 VMs for pt to call back.

## 2016-07-08 ENCOUNTER — Encounter: Payer: Self-pay | Admitting: Physician Assistant

## 2016-07-08 ENCOUNTER — Other Ambulatory Visit: Payer: Self-pay | Admitting: Family Medicine

## 2016-07-08 ENCOUNTER — Ambulatory Visit (INDEPENDENT_AMBULATORY_CARE_PROVIDER_SITE_OTHER): Payer: 59 | Admitting: Physician Assistant

## 2016-07-08 VITALS — BP 108/62 | HR 73 | Temp 97.8°F | Resp 18 | Ht 71.0 in | Wt 170.2 lb

## 2016-07-08 DIAGNOSIS — Z79899 Other long term (current) drug therapy: Secondary | ICD-10-CM | POA: Diagnosis not present

## 2016-07-08 DIAGNOSIS — F4323 Adjustment disorder with mixed anxiety and depressed mood: Secondary | ICD-10-CM

## 2016-07-08 DIAGNOSIS — F410 Panic disorder [episodic paroxysmal anxiety] without agoraphobia: Secondary | ICD-10-CM

## 2016-07-08 MED ORDER — FLUOXETINE HCL 20 MG PO TABS: 20.0000 mg | ORAL_TABLET | Freq: Every day | ORAL | 1 refills | Status: DC

## 2016-07-08 NOTE — Patient Instructions (Signed)
We recommend that you schedule a mammogram for breast cancer screening. Typically, you do not need a referral to do this. Please contact a local imaging center to schedule your mammogram.  [facility] - [phone]  *ask for the Radiology Department [facility] - [phone] or [phone]  [facility] - [phone] [facility] - [phone] [facility] - [phone]  *ask for the Radiology Department [facility] - [phone]  *ask for the Radiology Department [facility] - [phone]  *ask for the Mammography Department [facility] - [phone]     IF you received an x-ray today, you will receive an invoice from [facility]. Please contact [facility] at [phone] with questions or concerns regarding your invoice.   IF you received labwork today, you will receive an invoice from [facility]. Please contact [facility] at [phone] with questions or concerns regarding your invoice.   Our billing staff will not be able to assist you with questions regarding bills from these companies.  You will be contacted with the lab results as soon as they are available. The fastest way to get your results is to activate your My Chart account. Instructions are located on the last page of this paperwork. If you have not heard from us regarding the results in 2 weeks, please contact this office.      

## 2016-07-08 NOTE — Progress Notes (Signed)
Candice Pratt  MRN: [id number] DOB: Sep 22, 1965  PCP: Wendie Agreste, MD  Subjective:  Pt is a 51 year old female presents to clinic for f/u adjustment disorder.    Initial OV 5/09 following blood exposure from HIV+ and Hep C+ patient on Jun 03, 2016. She is a surgical tech.  Since her last OV, she increased her dose of Prozac 10mg  after 2 weeks because she was feeling "dark cloudy mopy" - is now taking Prozac 83mh qd. Notes improvement of her mood and outlook. She went back to work and is doing great - she is able to work on "bloody cases" and she is fine. No longer having a lighter workload.  Denies medication side effects.   She is seeing therapist weekly.  She is getting married in July. She is on sexual precautions until Nov.   She gets her final HIV/Hep C test results back in October.   Review of Systems  Respiratory: Negative for cough, chest tightness, shortness of breath and wheezing.   Cardiovascular: Negative for chest pain and palpitations.  Gastrointestinal: Negative for abdominal pain, diarrhea, nausea and vomiting.  Neurological: Negative for weakness, light-headedness and headaches.  Psychiatric/Behavioral: Negative for behavioral problems, decreased concentration, dysphoric mood, self-injury and suicidal ideas. The patient is not nervous/anxious.     Patient Active Problem List   Diagnosis Date Noted  . 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 TOBACCO ABUSE 03/31/2009  . HEMORRHOIDS, EXTERNAL 03/31/2009  . FATIGUE 03/31/2009    Current Outpatient Prescriptions on File Prior to Visit  Medication Sig Dispense Refill  . FLUoxetine (PROZAC) 10 MG tablet Take 1 tablet (10 mg total) by mouth daily. Take 10mg  PO q am x 1 week; May increase dose by 10mg /day q weekly 45 tablet 3  . hydrOXYzine (ATARAX/VISTARIL) 50 MG tablet Take 1-2 tablets (50-100 mg total) by mouth every 6 (six) hours as needed for anxiety. 90 tablet 1  . levonorgestrel (MIRENA) 20 MCG/24HR IUD  1 each by Intrauterine route once.    . methocarbamol (ROBAXIN-750) 750 MG tablet Take 1 tablet (750 mg total) by mouth at bedtime as needed for muscle spasms. 30 tablet 2  . naproxen sodium (ANAPROX) 220 MG tablet Take 220 mg by mouth 1 day or 1 dose.    . terbinafine (LAMISIL) 250 MG tablet Take 1 tablet (250 mg total) by mouth daily. 30 tablet 2   No current facility-administered medications on file prior to visit.     No Known Allergies   Objective:  BP 108/62 (BP Location: Right Arm, Patient Position: Sitting, Cuff Size: Normal)   Pulse 73   Temp 97.8 F (36.6 C) (Oral)   Resp 18   Ht 5\' 11"  (1.803 m)   Wt 170 lb 3.2 oz (77.2 kg)   SpO2 100%   BMI 23.74 kg/m   Physical Exam  Constitutional: She is oriented to person, place, and time and well-developed, well-nourished, and in no distress. No distress.  Cardiovascular: Normal rate, regular rhythm and normal heart sounds.   Neurological: She is alert and oriented to person, place, and time. GCS score is 15.  Skin: Skin is warm and dry.  Psychiatric: Mood, memory, affect and judgment normal.  Vitals reviewed.   Assessment and Plan :  1. Adjustment disorder with mixed anxiety and depressed mood 2. Panic disorder 3. Encounter for medication management - FLUoxetine (PROZAC) 20 MG tablet; Take 1 tablet (20 mg total) by mouth daily. May increase dose by 10mg /day q weekly  Dispense: 60 tablet; Refill: 1 - Pt doing markedly better, she is able to perform full workday involving "bloody cases" with no panic or anxiety attacks. Will con't 20mg  Prozac qd. Plan to con't this dose with possible taper off the medication after she receives her final blood test results in October. She agrees with plan. RTC in 3 months.   Mercer Pod, PA-C  Primary Care at [facility] 07/08/2016 12:33 PM

## 2016-07-09 NOTE — Telephone Encounter (Signed)
Patient came in to see Whitney on 07/08/16 stated she did not need forms completed.

## 2016-07-11 ENCOUNTER — Ambulatory Visit (INDEPENDENT_AMBULATORY_CARE_PROVIDER_SITE_OTHER): Payer: 59 | Admitting: Physician Assistant

## 2016-07-11 ENCOUNTER — Encounter: Payer: Self-pay | Admitting: Physician Assistant

## 2016-07-11 VITALS — BP 114/57 | HR 81 | Temp 98.5°F | Resp 16 | Ht 71.0 in | Wt 171.8 lb

## 2016-07-11 DIAGNOSIS — R197 Diarrhea, unspecified: Secondary | ICD-10-CM

## 2016-07-11 NOTE — Patient Instructions (Signed)
     IF you received an x-ray today, you will receive an invoice from [facility]. Please contact [facility] at [phone] with questions or concerns regarding your invoice.   IF you received labwork today, you will receive an invoice from [facility]. Please contact [facility] at [phone] with questions or concerns regarding your invoice.   Our billing staff will not be able to assist you with questions regarding bills from these companies.  You will be contacted with the lab results as soon as they are available. The fastest way to get your results is to activate your My Chart account. Instructions are located on the last page of this paperwork. If you have not heard from us regarding the results in 2 weeks, please contact this office.     

## 2016-07-11 NOTE — Progress Notes (Signed)
07/11/2016 2:22 PM   DOB: 28-Apr-1965 / MRN: [id number]  SUBJECTIVE:  Candice Pratt is a 51 y.o. female presenting for diarrhea.  This has been present for about 5 days and is worsening. She has been traveling in [place] but is not sure if this is related as she did not really change her eating habits.   She was recently exposed to HIV and Hep C during an operation in the OR as she works in the [employer]. She is a surgical tech. She did do HIV proph for 30 days. Tivicay/Descovy. The diarhhea did start after the stopping the medications. She denies rash, photosensitivity.  She is eating well and has a good appetite. Denies any new acid reflux.  She does associate some lower abdominal pain. Denies abnormal vaginal discharge or odor. She has not tried anything for the diarrhea. Denies fever, chills. The diarrhea does have a foul smell.   She has No Known Allergies.   She  has a past medical history of Asthma; GERD (gastroesophageal reflux disease); and Hiatal hernia.    She  reports that she has been smoking Cigarettes.  She has a 10.00 pack-year smoking history. She has never used smokeless tobacco. She reports that she drinks about 0.6 oz of alcohol per week . She reports that she does not use drugs. She  reports that she currently engages in sexual activity. She reports using the following method of birth control/protection: IUD. The patient  has a past surgical history that includes Knee surgery.  Her family history includes Cancer in her mother and other; Diabetes in her brother and other; Hypertension in her brother; Stroke in her maternal grandmother.  Review of Systems  Constitutional: Negative for chills and fever.  Respiratory: Negative for cough.   Gastrointestinal: Negative for nausea.  Musculoskeletal: Negative for myalgias.  Skin: Negative for rash.  Neurological: Negative for dizziness.    The problem list and medications were reviewed and updated by myself where necessary and  exist elsewhere in the encounter.   OBJECTIVE:  BP (!) 114/57   Pulse 81   Temp 98.5 F (36.9 C) (Oral)   Resp 16   Ht 5\' 11"  (1.803 m)   Wt 171 lb 12.8 oz (77.9 kg)   SpO2 98%   BMI 23.96 kg/m   BP Readings from Last 3 Encounters:  07/11/16 (!) 114/57  07/08/16 108/62  06/12/16 127/78     Physical Exam  Constitutional: She is active.  Non-toxic appearance.  Cardiovascular: Normal rate.   Pulmonary/Chest: Effort normal. No tachypnea.  Abdominal: Soft. Normal appearance and bowel sounds are normal. She exhibits no mass. There is no tenderness. There is no rigidity, no rebound, no guarding and no CVA tenderness.  Neurological: She is alert.  Skin: Skin is warm and dry. She is not diaphoretic. No pallor.    No results found for this or any previous visit (from the past 72 hour(s)).  No results found.  ASSESSMENT AND PLAN:  Malky was seen today for diarrhea.  Diagnoses and all orders for this visit:  Diarrhea of presumed infectious origin: Low risk however she describes a foul smell and would like this ruled out.  -     GI Profile, Stool, PCR    The patient is advised to call or return to clinic if she does not see an improvement in symptoms, or to seek the care of the closest emergency department if she worsens with the above plan.   Philis Fendt,  MHS, PA-C [facility] [place] 07/11/2016 2:22 PM

## 2016-07-14 LAB — GI PROFILE, STOOL, PCR
Adenovirus F 40/41: NOT DETECTED
Astrovirus: NOT DETECTED
C difficile toxin A/B: NOT DETECTED
Campylobacter: NOT DETECTED
Cryptosporidium: NOT DETECTED
Cyclospora cayetanensis: NOT DETECTED
ENTEROTOXIGENIC E COLI: NOT DETECTED
Entamoeba histolytica: NOT DETECTED
Enteroaggregative E coli: NOT DETECTED
Enteropathogenic E coli: NOT DETECTED
GIARDIA LAMBLIA: NOT DETECTED
Norovirus GI/GII: NOT DETECTED
PLESIOMONAS SHIGELLOIDES: NOT DETECTED
Rotavirus A: NOT DETECTED
SHIGELLA/ENTEROINVASIVE E COLI: NOT DETECTED
Salmonella: NOT DETECTED
Sapovirus: NOT DETECTED
Shiga-toxin-producing E coli: NOT DETECTED
Vibrio cholerae: NOT DETECTED
Vibrio: NOT DETECTED
Yersinia enterocolitica: NOT DETECTED

## 2016-08-25 ENCOUNTER — Other Ambulatory Visit: Payer: Self-pay | Admitting: Physician Assistant

## 2016-08-25 DIAGNOSIS — F411 Generalized anxiety disorder: Secondary | ICD-10-CM

## 2016-10-15 DIAGNOSIS — H5213 Myopia, bilateral: Secondary | ICD-10-CM | POA: Diagnosis not present

## 2016-11-14 ENCOUNTER — Encounter: Payer: Self-pay | Admitting: Physician Assistant

## 2016-11-14 ENCOUNTER — Ambulatory Visit (INDEPENDENT_AMBULATORY_CARE_PROVIDER_SITE_OTHER): Payer: 59 | Admitting: Physician Assistant

## 2016-11-14 VITALS — BP 114/66 | Resp 16 | Ht 71.0 in | Wt 181.4 lb

## 2016-11-14 DIAGNOSIS — R220 Localized swelling, mass and lump, head: Secondary | ICD-10-CM

## 2016-11-14 DIAGNOSIS — B351 Tinea unguium: Secondary | ICD-10-CM | POA: Diagnosis not present

## 2016-11-14 MED ORDER — PREDNISONE 50 MG PO TABS: ORAL_TABLET | ORAL | 0 refills | Status: DC

## 2016-11-14 MED ORDER — FLUCONAZOLE 150 MG PO TABS: 150.0000 mg | ORAL_TABLET | ORAL | 0 refills | Status: DC

## 2016-11-14 MED FILL — predniSONE 50 MG TABS: 50 | 30 days supply | Qty: 12 | Fill #0

## 2016-11-14 MED FILL — FLUCONAZOLE 150 MG TABLET: 150 | 56 days supply | Qty: 8 | Fill #0

## 2016-11-14 NOTE — Progress Notes (Signed)
11/14/2016 10:40 AM   DOB: [birth year] / MRN: [id number]  SUBJECTIVE:  Candice Pratt is a 51 y.o. female presenting for multiple complaints.  Continues to have some lip swelling and is seeing allergy for this.  Has been taking up to 40 mg of zyrtec daily to control this but has been able to reduce her dosage, but noticed the lip swelling is coming back over the last week and has gone back up to her 40 mg.  Has another appointment with allergy but this is six weeks out.   Has some nail thickening on her 2nd toes bilaterally that has been there for months.  Was seen by PA English and was advised to start lamisil but did not fill as pharmacy told her there was an interaction with prozac. She is off of the prozac now and would like that filled again today.  No history of diabetes.     She has No Known Allergies.   She  has a past medical history of Asthma; GERD (gastroesophageal reflux disease); and Hiatal hernia.    She  reports that she has been smoking Cigarettes.  She has a 10.00 pack-year smoking history. She has never used smokeless tobacco. She reports that she drinks about 0.6 oz of alcohol per week . She reports that she does not use drugs. She  reports that she currently engages in sexual activity. She reports using the following method of birth control/protection: IUD. The patient  has a past surgical history that includes Knee surgery.  Her family history includes Cancer in her mother and other; Diabetes in her brother and other; Hypertension in her brother; Stroke in her maternal grandmother.  Review of Systems  Constitutional: Negative for chills and fever.  Gastrointestinal: Negative for nausea.  Skin: Positive for itching. Negative for rash.  Neurological: Negative for dizziness.    The problem list and medications were reviewed and updated by myself where necessary and exist elsewhere in the encounter.   OBJECTIVE:  BP 114/66 (BP Location: Left Arm, Patient Position:  Sitting, Cuff Size: Normal)   Resp 16   Ht 5\' 11"  (1.803 m)   Wt 181 lb 6.4 oz (82.3 kg)   BMI 25.30 kg/m   Physical Exam  Constitutional: She is oriented to person, place, and time. She appears well-developed and well-nourished. No distress.  HENT:  Head:    Musculoskeletal: Normal range of motion.  Neurological: She is alert and oriented to person, place, and time.  Skin: No rash noted. She is not diaphoretic.    No results found for this or any previous visit (from the past 72 hour(s)).  No results found.  ASSESSMENT AND PLAN:  [name] was seen today for oral swelling and medication refill.  Diagnoses and all orders for this visit:  Swelling of both lips: Non acute today but tends to get worse in the morning.  Advised that she continue to treat herself per allergy reqs. She will check her labs to see if a C1 has been done, and if not I will order that for her to eval for heriditary angioedema.  -     predniSONE (DELTASONE) 50 MG tablet; Take 1 tab daily for 3 days, then stop for for 5 days.  Okay to start back after 5 days if swelling comes back.  Onychomycosis -     fluconazole (DIFLUCAN) 150 MG tablet; Take 1 tablet (150 mg total) by mouth once a week.    The patient is advised  to call or return to clinic if she does not see an improvement in symptoms, or to seek the care of the closest emergency department if she worsens with the above plan.   Philis Fendt, MHS, PA-C [facility] at [facility] 11/14/2016 10:40 AM

## 2016-11-14 NOTE — Patient Instructions (Signed)
     IF you received an x-ray today, you will receive an invoice from [facility]. Please contact [facility] at [phone] with questions or concerns regarding your invoice.   IF you received labwork today, you will receive an invoice from [facility]. Please contact [facility] at [phone] with questions or concerns regarding your invoice.   Our billing staff will not be able to assist you with questions regarding bills from these companies.  You will be contacted with the lab results as soon as they are available. The fastest way to get your results is to activate your My Chart account. Instructions are located on the last page of this paperwork. If you have not heard from us regarding the results in 2 weeks, please contact this office.     

## 2016-12-31 ENCOUNTER — Ambulatory Visit (HOSPITAL_COMMUNITY)
Admission: EM | Admit: 2016-12-31 | Discharge: 2016-12-31 | Disposition: A | Discharge status: Home / Self Care | Payer: 59 | Attending: Family Medicine | Admitting: Family Medicine

## 2016-12-31 ENCOUNTER — Encounter (HOSPITAL_COMMUNITY): Payer: Self-pay | Admitting: Family Medicine

## 2016-12-31 DIAGNOSIS — L03116 Cellulitis of left lower limb: Secondary | ICD-10-CM

## 2016-12-31 MED ORDER — DOXYCYCLINE HYCLATE 100 MG PO CAPS: 100.0000 mg | ORAL_CAPSULE | Freq: Two times a day (BID) | ORAL | 0 refills | Status: DC

## 2016-12-31 NOTE — ED Provider Notes (Signed)
[facility]    CSN: [id number] Arrival date & time: 12/31/16  1638     History   Chief Complaint Chief Complaint  Patient presents with  . Wound Infection    HPI Candice Pratt is a 51 y.o. female.   51 year-old female, presenting today complaining of possible infection to the left ankle. States that she was shaving her legs two days ago in the bath tub. States that cut a slice of skin off the back of her left ankle. She immediately washed it off and applied antibiotic ointment. She noticed itching to the area yesterday. Worsening today with increased swelling, redness and blisters  No fever or chills    The history is provided by the patient.  Wound Check  This is a new problem. The current episode started 2 days ago. The problem occurs constantly. The problem has been gradually worsening. Pertinent negatives include no chest pain, no abdominal pain, no headaches and no shortness of breath. Nothing aggravates the symptoms. Nothing relieves the symptoms. Treatments tried: antibiotic ointment  The treatment provided mild relief.    Past Medical History:  Diagnosis Date  . Asthma   . GERD (gastroesophageal reflux disease)   . Hiatal hernia     Patient Active Problem List   Diagnosis Date Noted  . 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 TOBACCO ABUSE 03/31/2009  . HEMORRHOIDS, EXTERNAL 03/31/2009  . FATIGUE 03/31/2009    Past Surgical History:  Procedure Laterality Date  . KNEE SURGERY      OB History    No data available       Home Medications    Prior to Admission medications   Medication Sig Start Date End Date Taking? Authorizing Provider  doxycycline (VIBRAMYCIN) 100 MG capsule Take 1 capsule (100 mg total) by mouth 2 (two) times daily. 12/31/16   Trennon Torbeck C, PA-C  fluconazole (DIFLUCAN) 150 MG tablet Take 1 tablet (150 mg total) by mouth once a week. 11/14/16   Tereasa Coop, PA-C  hydrOXYzine (ATARAX/VISTARIL) 50 MG tablet TAKE 1-2  TABLETS (50-100 MG TOTAL) BY MOUTH EVERY 6 (SIX) HOURS AS NEEDED FOR ANXIETY. Patient not taking: Reported on 11/14/2016 08/29/16   McVey, Gelene Mink, PA-C  levonorgestrel (MIRENA) 20 MCG/24HR IUD 1 each by Intrauterine route once.    [provider]  methocarbamol (ROBAXIN-750) 750 MG tablet Take 1 tablet (750 mg total) by mouth at bedtime as needed for muscle spasms. 08/31/15   Ivar Drape D, PA  naproxen sodium (ANAPROX) 220 MG tablet Take 220 mg by mouth 4 (four) times daily.     [provider]  predniSONE (DELTASONE) 50 MG tablet Take 1 tab daily for 3 days, then stop for for 5 days.  Okay to start back after 5 days if swelling comes back. 11/14/16   Tereasa Coop, PA-C  terbinafine (LAMISIL) 250 MG tablet Take 1 tablet (250 mg total) by mouth daily. 12/22/15   Wardell Honour, MD    Family History Family History  Problem Relation Age of Onset  . Diabetes Other   . Cancer Other   . Cancer Mother   . Diabetes Brother   . Hypertension Brother   . Stroke Maternal Grandmother     Social History Social History   Tobacco Use  . Smoking status: Current Every Day Smoker    Packs/day: 0.50    Years: 20.00    Pack years: 10.00    Types: Cigarettes  . Smokeless tobacco: Never  Used  Substance Use Topics  . Alcohol use: Yes    Alcohol/week: 0.6 oz    Types: 1 Glasses of wine per week  . Drug use: No     Allergies   Patient has no known allergies.   Review of Systems Review of Systems  Constitutional: Negative for chills and fever.  HENT: Negative for ear pain and sore throat.   Eyes: Negative for pain and visual disturbance.  Respiratory: Negative for cough and shortness of breath.   Cardiovascular: Negative for chest pain and palpitations.  Gastrointestinal: Negative for abdominal pain and vomiting.  Genitourinary: Negative for dysuria and hematuria.  Musculoskeletal: Negative for arthralgias and back pain.  Skin: Positive for wound.  Negative for color change and rash.  Neurological: Negative for seizures, syncope and headaches.  All other systems reviewed and are negative.    Physical Exam Triage Vital Signs ED Triage Vitals  Enc Vitals Group     BP 12/31/16 1703 119/71     Pulse Rate 12/31/16 1702 88     Resp 12/31/16 1702 18     Temp 12/31/16 1703 98.3 F (36.8 C)     Temp src --      SpO2 12/31/16 1702 96 %     Weight --      Height --      Head Circumference --      Peak Flow --      Pain Score 12/31/16 1703 2     Pain Loc --      Pain Edu? --      Excl. in [place]? --    No data found.  Updated Vital Signs BP 119/71   Pulse 88   Temp 98.3 F (36.8 C)   Resp 18   SpO2 96%   Visual Acuity Right Eye Distance:   Left Eye Distance:   Bilateral Distance:    Right Eye Near:   Left Eye Near:    Bilateral Near:     Physical Exam  Constitutional: She appears well-developed and well-nourished. No distress.  HENT:  Head: Normocephalic and atraumatic.  Eyes: Conjunctivae are normal.  Neck: Neck supple.  Cardiovascular: Normal rate and regular rhythm.  No murmur heard. Pulmonary/Chest: Effort normal and breath sounds normal. No respiratory distress.  Abdominal: Soft. There is no tenderness.  Musculoskeletal: She exhibits no edema.  Neurological: She is alert.  Skin: Skin is warm and dry. Rash noted.     Erythematous edematous area to the posterior left ankle with small blisters noted surrounding the abrasion. Mild red streaking   Psychiatric: She has a normal mood and affect.  Nursing note and vitals reviewed.    UC Treatments / Results  Labs (all labs ordered are listed, but only abnormal results are displayed) Labs Reviewed - No data to display  EKG  EKG Interpretation None       Radiology No results found.  Procedures Procedures (including critical care time)  Medications Ordered in UC Medications - No data to display   Initial Impression / Assessment and Plan / UC  Course  I have reviewed the triage vital signs and the nursing notes.  Pertinent labs & imaging results that were available during my care of the patient were reviewed by me and considered in my medical decision making (see chart for details).     Cellulitis left posterior ankle  Will treat with doxycycline Wound care discussed Return precautions discussed   Final Clinical Impressions(s) / UC Diagnoses  Final diagnoses:  Cellulitis of left lower extremity    ED Discharge Orders        Ordered    doxycycline (VIBRAMYCIN) 100 MG capsule  2 times daily     12/31/16 1720       Controlled Substance Prescriptions [place] Controlled Substance Registry consulted? Not Applicable   Phebe Colla, [place] 12/31/16 1720

## 2016-12-31 NOTE — ED Triage Notes (Signed)
Pt here for wound to left ankle. reports that she cut herself shaving and reports now blisters and red and swollen.

## 2017-01-04 ENCOUNTER — Ambulatory Visit (INDEPENDENT_AMBULATORY_CARE_PROVIDER_SITE_OTHER): Payer: 59 | Admitting: Osteopathic Medicine

## 2017-01-04 ENCOUNTER — Encounter: Payer: Self-pay | Admitting: Osteopathic Medicine

## 2017-01-04 VITALS — BP 122/68 | HR 75 | Temp 97.9°F | Resp 16 | Ht 69.5 in | Wt 177.8 lb

## 2017-01-04 DIAGNOSIS — M7711 Lateral epicondylitis, right elbow: Secondary | ICD-10-CM | POA: Diagnosis not present

## 2017-01-04 MED ORDER — PREDNISONE 10 MG (21) PO TBPK: ORAL_TABLET | ORAL | 0 refills | Status: DC

## 2017-01-04 NOTE — Progress Notes (Signed)
HPI: Candice Pratt is a 51 y.o. female with has a past medical history of Asthma, GERD (gastroesophageal reflux disease), and Hiatal hernia.  who presents to Primary Care at [facility] today, 01/04/17,  for chief complaint of:  Chief Complaint  Patient presents with  . Elbow Pain    RIGHT  . Cellulitis    LEFT ANKLE    . Seen 12/31/16 (4 days ago) Dr Pauletta Browns note reviewed. Nicked herself shaving L posterior ankle and concern for infection, Doxy Rx'ed. RTC precautions reviewed.  . Here today with concern for recheck cellulitis and R elbow pain.    Concern for tendinitis in R lateral elbow. 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     Past medical, surgical, social and family history reviewed:  Patient Active Problem List   Diagnosis Date Noted  . 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 TOBACCO ABUSE 03/31/2009  . HEMORRHOIDS, EXTERNAL 03/31/2009  . FATIGUE 03/31/2009    Past Surgical History:  Procedure Laterality Date  . KNEE SURGERY      Social History   Tobacco Use  . Smoking status: Current Every Day Smoker    Packs/day: 0.50    Years: 20.00    Pack years: 10.00    Types: Cigarettes  . Smokeless tobacco: Never Used  Substance Use Topics  . Alcohol use: Yes    Alcohol/week: 0.6 oz    Types: 1 Glasses of wine per week    Family History  Problem Relation Age of Onset  . Diabetes Other   . Cancer Other   . Cancer Mother   . Diabetes Brother   . Hypertension Brother   . Stroke Maternal Grandmother      Current medication list and allergy/intolerance information reviewed:    Current Outpatient Medications  Medication Sig Dispense Refill  . doxycycline (VIBRAMYCIN) 100 MG capsule Take 1 capsule (100 mg total) by mouth 2 (two) times daily. 20 capsule 0  . fluconazole (DIFLUCAN) 150 MG tablet Take 1 tablet (150 mg total) by mouth once  a week. 8 tablet 0  . levonorgestrel (MIRENA) 20 MCG/24HR IUD 1 each by Intrauterine route once.    . methocarbamol (ROBAXIN-750) 750 MG tablet Take 1 tablet (750 mg total) by mouth at bedtime as needed for muscle spasms. 30 tablet 2  . naproxen sodium (ANAPROX) 220 MG tablet Take 220 mg by mouth 4 (four) times daily.     . predniSONE (DELTASONE) 50 MG tablet Take 1 tab daily for 3 days, then stop for for 5 days.  Okay to start back after 5 days if swelling comes back. 12 tablet 0  . terbinafine (LAMISIL) 250 MG tablet Take 1 tablet (250 mg total) by mouth daily. 30 tablet 2   No current facility-administered medications for this visit.     No Known Allergies    Review of Systems:  Constitutional:  No  fever, no chills  Musculoskeletal: +new myalgia/arthralgia  Skin: +rash as per HPI  Neurologic: No  weakness, No  dizziness,   Exam:  BP 122/68   Pulse 75   Temp 97.9 F (36.6 C) (Oral)   Resp 16   Ht 5' 9.5" (1.765 m)   Wt 177 lb 12.8 oz (80.6 kg)   SpO2 97%   BMI 25.88 kg/m   Constitutional: VS see above. General Appearance: alert, well-developed, well-nourished, NAD  Eyes:  Normal lids and conjunctive, non-icteric sclera  Neck: No masses, trachea midline.  Respiratory: Normal respiratory effort.   Musculoskeletal: Gait normal. (+)pain at lateral epicondyle worse with resisted pronation   Neurological: Normal balance/coordination. No tremor.   Skin: warm, dry, intact. Seom blistering and minimal erythema at L posterior ankle   Psychiatric: Normal judgment/insight. Normal mood and affect. Oriented x3.      ASSESSMENT/PLAN:   Lateral epicondylitis of right elbow - Plan: predniSONE (STERAPRED UNI-PAK 21 TAB) 10 MG (21) TBPK tablet    Patient Instructions     Try steroids  Naproxen as needed  Home exercises   If no better, would consider formal PT or Sport Medicine referral - call us if you'd like me to place a referral!    IF you received an x-ray  today, you will receive an invoice from [facility]. Please contact [facility] at [phone] with questions or concerns regarding your invoice.   IF you received labwork today, you will receive an invoice from [facility]. Please contact [facility] at [phone] with questions or concerns regarding your invoice.   Our billing staff will not be able to assist you with questions regarding bills from these companies.  You will be contacted with the lab results as soon as they are available. The fastest way to get your results is to activate your My Chart account. Instructions are located on the last page of this paperwork. If you have not heard from [place] regarding the results in 2 weeks, please contact this office.        Visit summary with medication list and pertinent instructions was printed for patient to review. All questions at time of visit were answered - patient instructed to contact office with any additional concerns. ER/RTC precautions were reviewed with the patient. Follow-up plan: Return if symptoms worsen or fail to improve.  Note: Total time spent 15 minutes, greater than 50% of the visit was spent face-to-face counseling and coordinating care for the following: The encounter diagnosis was Lateral epicondylitis of right elbow..  Please note: voice recognition software was used to produce this document, and typos may escape review. Please contact me for any needed clarifications.

## 2017-01-04 NOTE — Patient Instructions (Addendum)
   Try steroids  Naproxen as needed  Home exercises   If no better, would consider formal PT or Sport Medicine referral - call us if you'd like me to place a referral!    IF you received an x-ray today, you will receive an invoice from [facility]. Please contact [facility] at [phone] with questions or concerns regarding your invoice.   IF you received labwork today, you will receive an invoice from [facility]. Please contact [facility] at [phone] with questions or concerns regarding your invoice.   Our billing staff will not be able to assist you with questions regarding bills from these companies.  You will be contacted with the lab results as soon as they are available. The fastest way to get your results is to activate your My Chart account. Instructions are located on the last page of this paperwork. If you have not heard from [place] regarding the results in 2 weeks, please contact this office.

## 2017-02-04 HISTORY — PX: LUMBAR FUSION: SHX111

## 2017-02-25 ENCOUNTER — Ambulatory Visit (INDEPENDENT_AMBULATORY_CARE_PROVIDER_SITE_OTHER): Payer: 59 | Admitting: Family Medicine

## 2017-02-25 ENCOUNTER — Ambulatory Visit (INDEPENDENT_AMBULATORY_CARE_PROVIDER_SITE_OTHER): Payer: 59

## 2017-02-25 ENCOUNTER — Encounter: Payer: Self-pay | Admitting: Family Medicine

## 2017-02-25 ENCOUNTER — Other Ambulatory Visit: Payer: Self-pay

## 2017-02-25 VITALS — BP 114/78 | HR 94 | Temp 98.3°F | Resp 17 | Ht 69.5 in | Wt 183.6 lb

## 2017-02-25 DIAGNOSIS — M7711 Lateral epicondylitis, right elbow: Secondary | ICD-10-CM

## 2017-02-25 DIAGNOSIS — M25521 Pain in right elbow: Secondary | ICD-10-CM

## 2017-02-25 NOTE — Patient Instructions (Addendum)
Thanks for coming in today. I will refer you to Dr. Fredna Dow to see if injection would be recommended, or possible physical therapy.  I will let you know if there are any concerns on your xray. Counterforce brace for now and try to avoid palm downward grasping. Let me know if there are any questions in the meantime.    Tennis Elbow Tennis elbow (lateral epicondylitis) is inflammation of the outer tendons of your forearm close to your elbow. Your tendons attach your muscles to your bones. The outer tendons of your forearm are used to extend your wrist, and they attach on the outside part of your elbow. Tennis elbow is often found in people who play tennis, but anyone may get the condition from repeatedly extending the wrist or turning the forearm. What are the causes? This condition is caused by repeatedly extending your wrist and using your hands. It can result from sports or work that requires repetitive forearm movements. Tennis elbow may also be caused by an injury. What increases the risk? You have a higher risk of developing tennis elbow if you play tennis or another racquet sport. You also have a higher risk if you frequently use your hands for work. This condition is also more likely to develop in:  Musicians.  Carpenters, painters, and plumbers.  Cooks.  Cashiers.  People who work in [employer].  Architect workers.  Butchers.  People who use computers.  What are the signs or symptoms? Symptoms of this condition include:  Pain and tenderness in your forearm and the outer part of your elbow. You may only feel the pain when you use your arm, or you may feel it even when you are not using your arm.  A burning feeling that runs from your elbow through your arm.  Weak grip in your hands.  How is this diagnosed? This condition may be diagnosed by medical history and physical exam. You may also have other tests, including:  X-rays.  MRI.  How is this treated? Your health  care provider will recommend lifestyle adjustments, such as resting and icing your arm. Treatment may also include:  Medicines for inflammation. This may include shots of cortisone if your pain continues.  Physical therapy. This may include massage or exercises.  An elbow brace.  Surgery may eventually be recommended if your pain does not go away with treatment. Follow these instructions at home: Activity  Rest your elbow and wrist as directed by your health care provider. Try to avoid any activities that caused the problem until your health care provider says that you can do them again.  If a physical therapist teaches you exercises, do all of them as directed.  If you lift an object, lift it with your palm facing upward. This lowers the stress on your elbow. Lifestyle  If your tennis elbow is caused by sports, check your equipment and make sure that: ? You are using it correctly. ? It is the best fit for you.  If your tennis elbow is caused by work, take breaks frequently, if you are able. Talk with your manager about how to best perform tasks in a way that is safe. ? If your tennis elbow is caused by computer use, talk with your manager about any changes that can be made to your work environment. General instructions  If directed, apply ice to the painful area: ? Put ice in a plastic bag. ? Place a towel between your skin and the bag. ? Leave the  ice on for 20 minutes, 2-3 times per day.  Take medicines only as directed by your health care provider.  If you were given a brace, wear it as directed by your health care provider.  Keep all follow-up visits as directed by your health care provider. This is important. Contact a health care provider if:  Your pain does not get better with treatment.  Your pain gets worse.  You have numbness or weakness in your forearm, hand, or fingers. This information is not intended to replace advice given to you by your health care  provider. Make sure you discuss any questions you have with your health care provider. Document Released: 01/21/2005 Document Revised: 09/21/2015 Document Reviewed: 01/17/2014 Elsevier Interactive Patient Education  2018 [employer].   IF you received an x-ray today, you will receive an invoice from [facility]. Please contact [facility] at [phone] with questions or concerns regarding your invoice.   IF you received labwork today, you will receive an invoice from [facility]. Please contact [facility] at [phone] with questions or concerns regarding your invoice.   Our billing staff will not be able to assist you with questions regarding bills from these companies.  You will be contacted with the lab results as soon as they are available. The fastest way to get your results is to activate your My Chart account. Instructions are located on the last page of this paperwork. If you have not heard from [place] regarding the results in 2 weeks, please contact this office.

## 2017-02-25 NOTE — Progress Notes (Signed)
Subjective:  By signing my name below, I, Essence Howell, attest that this documentation has been prepared under the direction and in the presence of Wendie Agreste, MD Electronically Signed: Ladene Artist, ED Scribe 02/25/2017 at 5:26 PM.   Patient ID: Candice Pratt, female    DOB: September 20, 1965, 52 y.o.   MRN: [id number]  Chief Complaint  Patient presents with  . right tennis elbow    since Sept. and getting worse per pt.  Taking aleve for the pain-not really helping the pain- helps with the generalized soreness but when she moves or twist the arm it doesn't help the sharp throbbing pain.  Pain level 8/10 and started as a dull pain but now is sharp and pulsating.   HPI Candice Pratt is a 52 y.o. female who presents to Primary Care at [facility] complaining of R elbow pain x 4 months. Seen 12/1 by Dr. Sheppard Coil. Treated with prednisone at that time. -- Pt did notice a sudden onset of a burning pain in the R elbow with carrying a case of water when she initially noticed pain. She states that prednisone improved pain temporarily but returned when she finished the taper. Pain is worse with shaking a hand, holding a glass of water, carrying objects palm down. Reports that pain is so severe that it wakes her up in the middle of the night. She has taken Aleve which takes the edge off. She also used a counterforce brace for 1 month at home but she can't wear it in the OR due to her scrubs. Reports similar symptoms once several yrs ago that resolved with resting her elbow. Pt is R hand dominant.  Patient Active Problem List   Diagnosis Date Noted  . Lateral epicondylitis of right elbow 01/04/2017  . 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 TOBACCO ABUSE 03/31/2009  . HEMORRHOIDS, EXTERNAL 03/31/2009  . FATIGUE 03/31/2009   Past Medical History:  Diagnosis Date  . Asthma   . GERD (gastroesophageal reflux disease)   . Hiatal hernia    Past Surgical History:  Procedure Laterality Date  .  KNEE SURGERY     No Known Allergies Prior to Admission medications   Medication Sig Start Date End Date Taking? Authorizing Provider  levonorgestrel (MIRENA) 20 MCG/24HR IUD 1 each by Intrauterine route once.   Yes [provider]  methocarbamol (ROBAXIN-750) 750 MG tablet Take 1 tablet (750 mg total) by mouth at bedtime as needed for muscle spasms. 08/31/15  Yes English, Colletta [place] D, PA  naproxen sodium (ANAPROX) 220 MG tablet Take 220 mg by mouth 4 (four) times daily.    Yes [provider]   Social History   Socioeconomic History  . Marital status: Married    Spouse name: Not on file  . Number of children: 3  . Years of education: Not on file  . Highest education level: Not on file  Social Needs  . Financial resource strain: Not on file  . Food insecurity - worry: Not on file  . Food insecurity - inability: Not on file  . Transportation needs - medical: Not on file  . Transportation needs - non-medical: Not on file  Occupational History    Employer: [employer]  Tobacco Use  . Smoking status: Current Every Day Smoker    Packs/day: 0.50    Years: 20.00    Pack years: 10.00    Types: Cigarettes  . Smokeless tobacco: Never Used  Substance and Sexual Activity  . Alcohol use: Yes  Alcohol/week: 0.6 oz    Types: 1 Glasses of wine per week  . Drug use: No  . Sexual activity: Yes    Birth control/protection: IUD  Other Topics Concern  . Not on file  Social History Narrative  . Not on file   Review of Systems  Musculoskeletal: Positive for arthralgias.      Objective:   Physical Exam  Constitutional: She is oriented to person, place, and time. She appears well-developed and well-nourished. No distress.  HENT:  Head: Normocephalic and atraumatic.  Eyes: Conjunctivae and EOM are normal.  Neck: Neck supple. No tracheal deviation present.  Cardiovascular: Normal rate.  Pulmonary/Chest: Effort normal. No respiratory distress.  Musculoskeletal: Normal  range of motion.  Elbow brace with a strap over lateral epicondyle. NVI distally to fingertips. Somewhat guarded due to pain in elbow but no weakness. R elbow: full extension and flexion. Skin intact. No erythema or soft tissue swelling. Tender on R epicondyle and R radial head. Some radiating pain to the medial elbow with palpitation laterally. Pain distal to epicondyle.  Neurological: She is alert and oriented to person, place, and time.  Skin: Skin is warm and dry.  Psychiatric: She has a normal mood and affect. Her behavior is normal.  Nursing note and vitals reviewed.  Vitals:   02/25/17 1637  BP: 114/78  Pulse: 94  Resp: 17  Temp: 98.3 F (36.8 C)  TempSrc: Oral  SpO2: 100%  Weight: 183 lb 9.6 oz (83.3 kg)  Height: 5' 9.5" (1.765 m)   Dg Elbow Complete Right (3+view)  Result Date: 02/25/2017 CLINICAL DATA:  Persistent right lateral elbow pain. EXAM: RIGHT ELBOW - COMPLETE 3+ VIEW COMPARISON:  None. FINDINGS: There is no evidence of fracture, dislocation, or joint effusion. There is no evidence of arthropathy or other focal bone abnormality. Soft tissues are unremarkable. IMPRESSION: Negative. Electronically Signed   By: Kerby Moors M.D.   On: 02/25/2017 18:14       Assessment & Plan:   Candice Pratt is a 52 y.o. female Right elbow pain - Plan: Ambulatory referral to Hand Surgery, DG ELBOW COMPLETE RIGHT (3+VIEW)  Lateral epicondylitis of right elbow - Plan: Ambulatory referral to Hand Surgery, DG ELBOW COMPLETE RIGHT (3+VIEW)  Persistent dominant arm lateral elbow pain, initially suspected lateral epicondylitis but now symptoms for months. Pain has worsened and some distal pain beyond epicondyle concerning for possible interosseous nerve involvement/radial tunnel. X-ray obtained with reported onset of pain after carrying a case of water, as opposed to typical onset of symptoms after repetitive use. No concerning findings on x-ray.  -Okay to continue counterforce brace  if that provides relief, stop if worsens pain  -Activity modification as much as possible, but somewhat limited with her job. Option of restrictions or FMLA paperwork if needed.   -Refer to hand surgeon for further evaluation and decision on possible injection versus physical therapy versus advanced imaging.    No orders of the defined types were placed in this encounter.  Patient Instructions   Thanks for coming in today. I will refer you to Dr. Fredna Dow to see if injection would be recommended, or possible physical therapy.  I will let you know if there are any concerns on your xray. Counterforce brace for now and try to avoid palm downward grasping. Let me know if there are any questions in the meantime.    Tennis Elbow Tennis elbow (lateral epicondylitis) is inflammation of the outer tendons of your forearm close to your  elbow. Your tendons attach your muscles to your bones. The outer tendons of your forearm are used to extend your wrist, and they attach on the outside part of your elbow. Tennis elbow is often found in people who play tennis, but anyone may get the condition from repeatedly extending the wrist or turning the forearm. What are the causes? This condition is caused by repeatedly extending your wrist and using your hands. It can result from sports or work that requires repetitive forearm movements. Tennis elbow may also be caused by an injury. What increases the risk? You have a higher risk of developing tennis elbow if you play tennis or another racquet sport. You also have a higher risk if you frequently use your hands for work. This condition is also more likely to develop in:  Musicians.  Carpenters, painters, and plumbers.  Cooks.  Cashiers.  People who work in [employer].  Architect workers.  Butchers.  People who use computers.  What are the signs or symptoms? Symptoms of this condition include:  Pain and tenderness in your forearm and the outer part of  your elbow. You may only feel the pain when you use your arm, or you may feel it even when you are not using your arm.  A burning feeling that runs from your elbow through your arm.  Weak grip in your hands.  How is this diagnosed? This condition may be diagnosed by medical history and physical exam. You may also have other tests, including:  X-rays.  MRI.  How is this treated? Your health care provider will recommend lifestyle adjustments, such as resting and icing your arm. Treatment may also include:  Medicines for inflammation. This may include shots of cortisone if your pain continues.  Physical therapy. This may include massage or exercises.  An elbow brace.  Surgery may eventually be recommended if your pain does not go away with treatment. Follow these instructions at home: Activity  Rest your elbow and wrist as directed by your health care provider. Try to avoid any activities that caused the problem until your health care provider says that you can do them again.  If a physical therapist teaches you exercises, do all of them as directed.  If you lift an object, lift it with your palm facing upward. This lowers the stress on your elbow. Lifestyle  If your tennis elbow is caused by sports, check your equipment and make sure that: ? You are using it correctly. ? It is the best fit for you.  If your tennis elbow is caused by work, take breaks frequently, if you are able. Talk with your manager about how to best perform tasks in a way that is safe. ? If your tennis elbow is caused by computer use, talk with your manager about any changes that can be made to your work environment. General instructions  If directed, apply ice to the painful area: ? Put ice in a plastic bag. ? Place a towel between your skin and the bag. ? Leave the ice on for 20 minutes, 2-3 times per day.  Take medicines only as directed by your health care provider.  If you were given a brace,  wear it as directed by your health care provider.  Keep all follow-up visits as directed by your health care provider. This is important. Contact a health care provider if:  Your pain does not get better with treatment.  Your pain gets worse.  You have numbness or weakness in your  forearm, hand, or fingers. This information is not intended to replace advice given to you by your health care provider. Make sure you discuss any questions you have with your health care provider. Document Released: 01/21/2005 Document Revised: 09/21/2015 Document Reviewed: 01/17/2014 Elsevier Interactive Patient Education  2018 [employer].   IF you received an x-ray today, you will receive an invoice from [facility]. Please contact [facility] at [phone] with questions or concerns regarding your invoice.   IF you received labwork today, you will receive an invoice from [place]. Please contact [facility] at [phone] with questions or concerns regarding your invoice.   Our billing staff will not be able to assist you with questions regarding bills from these companies.  You will be contacted with the lab results as soon as they are available. The fastest way to get your results is to activate your My Chart account. Instructions are located on the last page of this paperwork. If you have not heard from [place] regarding the results in 2 weeks, please contact this office.      I personally performed the services described in this documentation, which was scribed in my presence. The recorded information has been reviewed and considered for accuracy and completeness, addended by me as needed, and agree with information above.  Signed,   Merri Ray, MD [facility] at [place].  02/25/17 7:36 PM

## 2017-03-12 ENCOUNTER — Telehealth: Payer: Self-pay | Admitting: Family Medicine

## 2017-03-12 DIAGNOSIS — M545 Low back pain, unspecified: Secondary | ICD-10-CM

## 2017-03-12 NOTE — Telephone Encounter (Signed)
Pt called and is requesting a refill of Robaxin for complaints of lower back spasms. Pt was seen for back spasms in 08/2015 and given a prescription for Robaxin and states she has just ran out of the medication. Pt had a recent office visit with Dr. Carlota Raspberry on 02/25/17 and forgot to mention needing medication for spasms in her back. Pt asking if she needs to make another appt or if Robaxin could be called in. Pt uses [facility] on [address]

## 2017-03-12 NOTE — Telephone Encounter (Signed)
Copied from [place] [id number]. Topic: Quick Communication - Rx Refill/Question >> Mar 12, 2017  4:40 PM Cecelia Byars, NT wrote: Medication:  robaxin 750  Has the patient contacted their pharmacy? (Agent: If no, request that the patient contact the pharmacy for the refill.) Preferred Pharmacy (with phone number or street name): [facility] on [address]   [phone] Agent: Please be advised that RX refills may take up to 3 business days. We ask that you follow-up with your pharmacy.

## 2017-03-13 ENCOUNTER — Telehealth: Payer: Self-pay | Admitting: Family Medicine

## 2017-03-13 MED ORDER — METHOCARBAMOL 750 MG PO TABS
750.0000 mg | ORAL_TABLET | Freq: Every evening | ORAL | 0 refills | Status: DC | PRN
Start: 2017-03-13 — End: 2017-03-19

## 2017-03-13 NOTE — Telephone Encounter (Signed)
Seen by other providers for back pain, but notes reviewed. I will refill for #30, but follow up if symptoms are not improving, or for further refills.

## 2017-03-13 NOTE — Telephone Encounter (Signed)
Copied from [place]. Topic: General - Other >> Mar 12, 2017  4:37 PM Cecelia Byars, NT wrote: Reason for CRM: Patient called and would like a disk of  an x ray of her right elbow  from her visit  on 02/25/17 please call when ready [phone]

## 2017-03-13 NOTE — Telephone Encounter (Signed)
Left detailed message per Release  

## 2017-03-13 NOTE — Telephone Encounter (Signed)
Pt seen 02/25/17 for elbow pain, I see your notes stated you referred her to dr Fredna Dow or possible PT, Please Advise

## 2017-03-18 ENCOUNTER — Other Ambulatory Visit: Payer: Self-pay

## 2017-03-18 DIAGNOSIS — M545 Low back pain, unspecified: Secondary | ICD-10-CM

## 2017-03-19 ENCOUNTER — Other Ambulatory Visit: Payer: Self-pay | Admitting: Family Medicine

## 2017-03-19 DIAGNOSIS — M7711 Lateral epicondylitis, right elbow: Secondary | ICD-10-CM | POA: Diagnosis not present

## 2017-03-19 DIAGNOSIS — M545 Low back pain, unspecified: Secondary | ICD-10-CM

## 2017-03-19 MED ORDER — METHOCARBAMOL 750 MG PO TABS: 750.0000 mg | ORAL_TABLET | Freq: Every evening | ORAL | 0 refills | Status: DC | PRN

## 2017-03-19 NOTE — Progress Notes (Signed)
Re-ordered to different pharmacy

## 2017-04-03 ENCOUNTER — Ambulatory Visit (INDEPENDENT_AMBULATORY_CARE_PROVIDER_SITE_OTHER): Payer: 59

## 2017-04-03 ENCOUNTER — Ambulatory Visit (INDEPENDENT_AMBULATORY_CARE_PROVIDER_SITE_OTHER): Payer: 59 | Admitting: Physician Assistant

## 2017-04-03 ENCOUNTER — Telehealth: Payer: Self-pay | Admitting: Physician Assistant

## 2017-04-03 ENCOUNTER — Encounter: Payer: Self-pay | Admitting: Physician Assistant

## 2017-04-03 VITALS — BP 116/79 | HR 71 | Temp 97.7°F | Resp 16 | Ht 69.5 in | Wt 182.0 lb

## 2017-04-03 DIAGNOSIS — M545 Low back pain: Secondary | ICD-10-CM | POA: Diagnosis not present

## 2017-04-03 DIAGNOSIS — M5441 Lumbago with sciatica, right side: Secondary | ICD-10-CM

## 2017-04-03 DIAGNOSIS — M5136 Other intervertebral disc degeneration, lumbar region: Secondary | ICD-10-CM

## 2017-04-03 DIAGNOSIS — M5137 Other intervertebral disc degeneration, lumbosacral region: Secondary | ICD-10-CM

## 2017-04-03 DIAGNOSIS — G8929 Other chronic pain: Secondary | ICD-10-CM

## 2017-04-03 DIAGNOSIS — M25562 Pain in left knee: Secondary | ICD-10-CM | POA: Diagnosis not present

## 2017-04-03 DIAGNOSIS — M25552 Pain in left hip: Secondary | ICD-10-CM | POA: Diagnosis not present

## 2017-04-03 DIAGNOSIS — M533 Sacrococcygeal disorders, not elsewhere classified: Secondary | ICD-10-CM | POA: Diagnosis not present

## 2017-04-03 MED ORDER — CYCLOBENZAPRINE HCL 10 MG PO TABS: 5.0000 mg | ORAL_TABLET | Freq: Three times a day (TID) | ORAL | 0 refills | Status: DC | PRN

## 2017-04-03 MED ORDER — PREDNISONE 20 MG PO TABS: ORAL_TABLET | ORAL | 0 refills | Status: DC

## 2017-04-03 NOTE — Progress Notes (Signed)
PRIMARY CARE AT [place], [place] [address] [phone]  Date:  04/03/2017   Name:  Candice Pratt   DOB:  07-Dec-1965   MRN:  [id number]  PCP:  Wendie Agreste, MD    History of Present Illness:  Candice Pratt is a 52 y.o. female patient who presents to PCP with  Chief Complaint  Patient presents with  . Back Pain  . Leg Pain    Low back pain for the last 2 weeks.  Sharp pain at the low back at the sacrum, and low back pain that radiates to the right low back pain.  It is a sharp pain.  She will also has left lower leg pain at times.  She went to rehabilitation about 1 year ago.  She performed stretches that is low She has difficulty with bending forward.   she would take robaxin, bath, and the pain would resolve within a few days, however this has been unbearable.   She will have some instability of her left leg.  She will  She reports that she has low back pain for several years.  She  She has pain that is intermittent.     Patient Active Problem List   Diagnosis Date Noted  . Lateral epicondylitis of right elbow 01/04/2017  . 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 TOBACCO ABUSE 03/31/2009  . HEMORRHOIDS, EXTERNAL 03/31/2009  . FATIGUE 03/31/2009    Past Medical History:  Diagnosis Date  . Asthma   . GERD (gastroesophageal reflux disease)   . Hiatal hernia     Past Surgical History:  Procedure Laterality Date  . KNEE SURGERY      Social History   Tobacco Use  . Smoking status: Current Every Day Smoker    Packs/day: 0.50    Years: 20.00    Pack years: 10.00    Types: Cigarettes  . Smokeless tobacco: Never Used  Substance Use Topics  . Alcohol use: Yes    Alcohol/week: 0.6 oz    Types: 1 Glasses of wine per week  . Drug use: No    Family History  Problem Relation Age of Onset  . Diabetes Other   . Cancer Other   . Cancer Mother   . Diabetes Brother   . Hypertension Brother   . Stroke Maternal Grandmother     No Known  Allergies  Medication list has been reviewed and updated.  Current Outpatient Medications on File Prior to Visit  Medication Sig Dispense Refill  . levonorgestrel (MIRENA) 20 MCG/24HR IUD 1 each by Intrauterine route once.    . methocarbamol (ROBAXIN-750) 750 MG tablet Take 1 tablet (750 mg total) by mouth at bedtime as needed for muscle spasms. 30 tablet 0  . naproxen sodium (ANAPROX) 220 MG tablet Take 220 mg by mouth 4 (four) times daily.      No current facility-administered medications on file prior to visit.     ROS ROS otherwise unremarkable unless listed above.  Physical Examination: BP 116/79   Pulse 71   Temp 97.7 F (36.5 C) (Oral)   Resp 16   Ht 5' 9.5" (1.765 m)   Wt 182 lb (82.6 kg)   SpO2 100%   BMI 26.49 kg/m  Ideal Body Weight: Weight in (lb) to have BMI = 25: 171.4  Physical Exam  Constitutional: She is oriented to person, place, and time. She appears well-developed and well-nourished. No distress.  HENT:  Head: Normocephalic and atraumatic.  Right Ear:  External ear normal.  Left Ear: External ear normal.  Eyes: Pupils are equal, round, and reactive to light. Conjunctivae and EOM are normal.  Cardiovascular: Normal rate.  Pulmonary/Chest: Effort normal. No respiratory distress.  Musculoskeletal:       Lumbar back: She exhibits decreased range of motion, tenderness and bony tenderness. She exhibits no swelling, no edema and no spasm.  Neurological: She is alert and oriented to person, place, and time.  Skin: She is not diaphoretic.  Psychiatric: She has a normal mood and affect. Her behavior is normal.   Dg Lumbar Spine Complete  Result Date: 04/03/2017 CLINICAL DATA:  Chronic bilateral low back pain with right-sided sciatica. EXAM: LUMBAR SPINE - COMPLETE 4+ VIEW COMPARISON:  Radiographs of December 22, 2015. FINDINGS: Stable grade 1 anterolisthesis of L5-S1 is noted secondary to bilateral pars defects of L5. Severe degenerative disc disease is noted at  L5-S1. Remaining disc spaces appear intact. No fracture is noted. IMPRESSION: Stable grade 1 anterolisthesis of L5-S1 secondary to bilateral pars defects of L5. Severe degenerative disc disease is noted at L5-S1. No significant change compared to prior exam. Electronically Signed   By: Marijo Conception, M.D.   On: 04/03/2017 12:18   Dg Sacrum/coccyx  Result Date: 04/03/2017 CLINICAL DATA:  Left sacroiliac tenderness. EXAM: SACRUM AND COCCYX - 2+ VIEW COMPARISON:  None. FINDINGS: There is no evidence of fracture or other focal bone lesions. IMPRESSION: No significant abnormality seen involving the sacrum or coccyx. Electronically Signed   By: Marijo Conception, M.D.   On: 04/03/2017 12:16   Dg Knee Complete 4 Views Left  Result Date: 04/03/2017 CLINICAL DATA:  Left knee pain. EXAM: LEFT KNEE - COMPLETE 4+ VIEW COMPARISON:  None. FINDINGS: No evidence of fracture, dislocation, or joint effusion. No evidence of arthropathy or other focal bone abnormality. Soft tissues are unremarkable. IMPRESSION: Normal left knee. Electronically Signed   By: Marijo Conception, M.D.   On: 04/03/2017 12:21   Dg Hip Unilat W Or W/o Pelvis 2-3 Views Left  Result Date: 04/03/2017 CLINICAL DATA:  Left hip pain. EXAM: DG HIP (WITH OR WITHOUT PELVIS) 2-3V LEFT COMPARISON:  None. FINDINGS: There is no evidence of hip fracture or dislocation. There is no evidence of arthropathy or other focal bone abnormality. IMPRESSION: Normal left hip. Electronically Signed   By: Marijo Conception, M.D.   On: 04/03/2017 12:19     Assessment and Plan: Candice Pratt is a 52 y.o. female who is here today for cc of  Chief Complaint  Patient presents with  . Back Pain  . Leg Pain  Advised icing 3 times a day for 15 minutes.  Also given stretches to perform.  Muscle relaxant given.  Advised not to take with her previous Robaxin.  Precautions advised of sedation.  She is referred to [facility] given the longevity of her pain. Chronic bilateral low  back pain with right-sided sciatica - Plan: DG Lumbar Spine Complete, DG Sacrum/Coccyx, DG HIP UNILAT W OR W/O PELVIS 2-3 VIEWS LEFT, DG Knee Complete 4 Views Left, predniSONE (DELTASONE) 20 MG tablet, cyclobenzaprine (FLEXERIL) 10 MG tablet, AMB referral to orthopedics  Degenerative disc disease at L5-S1 level - Plan: predniSONE (DELTASONE) 20 MG tablet, cyclobenzaprine (FLEXERIL) 10 MG tablet, AMB referral to orthopedics  Ivar Drape, PA-C [facility] 3/29/20197:28 AM

## 2017-04-03 NOTE — Patient Instructions (Addendum)
Please ice the low back three times per day for 15 minutes.  Preferably after you do your stretches.   Do not take the flexeril with the methocarbamol.  Please note both medicines cause sedation.  Do not operate heavy machinery on this medication. I am referring you to ortho.  Please await this contact.   Degenerative Disk Disease Degenerative disk disease is a condition caused by the changes that occur in spinal disks as you grow older. Spinal disks are soft and compressible disks located between the bones of your spine (vertebrae). These disks act like shock absorbers. Degenerative disk disease can affect the whole spine. However, the neck and lower back are most commonly affected. Many changes can occur in the spinal disks with aging, such as:  The spinal disks may dry and shrink.  Small tears may occur in the tough, outer covering of the disk (annulus).  The disk space may become smaller due to loss of water.  Abnormal growths in the bone (spurs) may occur. This can put pressure on the nerve roots exiting the spinal canal, causing pain.  The spinal canal may become narrowed.  What increases the risk?  Being overweight.  Having a family history of degenerative disk disease.  Smoking.  There is increased risk if you are doing heavy lifting or have a sudden injury. What are the signs or symptoms? Symptoms vary from person to person and may include:  Pain that varies in intensity. Some people have no pain, while others have severe pain. The location of the pain depends on the part of your backbone that is affected. ? You will have neck or arm pain if a disk in the neck area is affected. ? You will have pain in your back, buttocks, or legs if a disk in the lower back is affected.  Pain that becomes worse while bending, reaching up, or with twisting movements.  Pain that may start gradually and then get worse as time passes. It may also start after a major or minor injury.  Numbness  or tingling in the arms or legs.  How is this diagnosed? Your health care provider will ask you about your symptoms and about activities or habits that may cause the pain. He or she may also ask about any injuries, diseases, or treatments you have had. Your health care provider will examine you to check for the range of movement that is possible in the affected area, to check for strength in your extremities, and to check for sensation in the areas of the arms and legs supplied by different nerve roots. You may also have:  An X-ray of the spine.  Other imaging tests, such as MRI.  How is this treated? Your health care provider will advise you on the best plan for treatment. Treatment may include:  Medicines.  Rehabilitation exercises.  Follow these instructions at home:  Follow proper lifting and walking techniques as advised by your health care provider.  Maintain good posture.  Exercise regularly as advised by your health care provider.  Perform relaxation exercises.  Change your sitting, standing, and sleeping habits as advised by your health care provider.  Change positions frequently.  Lose weight or maintain a healthy weight as advised by your health care provider.  Do not use any tobacco products, including cigarettes, chewing tobacco, or electronic cigarettes. If you need help quitting, ask your health care provider.  Wear supportive footwear.  Take medicines only as directed by your health care provider. Contact  a health care provider if:  Your pain does not go away within 1-4 weeks.  You have significant appetite or weight loss. Get help right away if:  Your pain is severe.  You notice weakness in your arms, hands, or legs.  You begin to lose control of your bladder or bowel movements.  You have fevers or night sweats. This information is not intended to replace advice given to you by your health care provider. Make sure you discuss any questions you have  with your health care provider. Document Released: 11/18/2006 Document Revised: 06/29/2015 Document Reviewed: 05/25/2013 Elsevier Interactive Patient Education  2018 [employer].     IF you received an x-ray today, you will receive an invoice from [facility]. Please contact [facility] at [phone] with questions or concerns regarding your invoice.   IF you received labwork today, you will receive an invoice from [facility]. Please contact [facility] at [phone] with questions or concerns regarding your invoice.   Our billing staff will not be able to assist you with questions regarding bills from these companies.  You will be contacted with the lab results as soon as they are available. The fastest way to get your results is to activate your My Chart account. Instructions are located on the last page of this paperwork. If you have not heard from [place] regarding the results in 2 weeks, please contact this office.

## 2017-04-03 NOTE — Telephone Encounter (Signed)
I will complete the note.  But patient reports desire to be with Ulyess Blossom, or Stann Mainland.  thanks

## 2017-04-17 NOTE — Telephone Encounter (Signed)
Pt calling to inquire about referral to Orthopedic, has not heard anything. Please check on.  Thanks

## 2017-04-17 NOTE — Telephone Encounter (Signed)
Referral sent to [facility]. Left pt vm letting her know this and that she can either call them or they will call her to set up appt.

## 2017-05-05 DIAGNOSIS — M5442 Lumbago with sciatica, left side: Secondary | ICD-10-CM | POA: Diagnosis not present

## 2017-05-06 ENCOUNTER — Other Ambulatory Visit: Payer: Self-pay | Admitting: Orthopaedic Surgery

## 2017-05-06 DIAGNOSIS — M545 Low back pain, unspecified: Secondary | ICD-10-CM

## 2017-05-11 ENCOUNTER — Ambulatory Visit
Admission: RE | Admit: 2017-05-11 | Discharge: 2017-05-11 | Disposition: A | Discharge status: Home / Self Care | Payer: 59 | Source: Ambulatory Visit | Attending: Orthopaedic Surgery | Admitting: Orthopaedic Surgery

## 2017-05-11 DIAGNOSIS — M545 Low back pain, unspecified: Secondary | ICD-10-CM

## 2017-05-11 DIAGNOSIS — M5136 Other intervertebral disc degeneration, lumbar region: Secondary | ICD-10-CM | POA: Diagnosis not present

## 2017-05-14 ENCOUNTER — Encounter: Payer: Self-pay | Admitting: Physician Assistant

## 2017-05-19 DIAGNOSIS — M5442 Lumbago with sciatica, left side: Secondary | ICD-10-CM | POA: Diagnosis not present

## 2017-06-26 ENCOUNTER — Encounter: Payer: Self-pay | Admitting: Family Medicine

## 2017-08-13 ENCOUNTER — Ambulatory Visit (INDEPENDENT_AMBULATORY_CARE_PROVIDER_SITE_OTHER): Payer: 59 | Admitting: Emergency Medicine

## 2017-08-13 ENCOUNTER — Encounter: Payer: Self-pay | Admitting: Emergency Medicine

## 2017-08-13 ENCOUNTER — Other Ambulatory Visit: Payer: Self-pay

## 2017-08-13 VITALS — BP 91/64 | HR 102 | Temp 99.2°F | Resp 16 | Ht 70.0 in | Wt 179.0 lb

## 2017-08-13 DIAGNOSIS — R590 Localized enlarged lymph nodes: Secondary | ICD-10-CM | POA: Diagnosis not present

## 2017-08-13 MED ORDER — DOXYCYCLINE HYCLATE 100 MG PO TABS: 100.0000 mg | ORAL_TABLET | Freq: Two times a day (BID) | ORAL | 1 refills | Status: AC

## 2017-08-13 NOTE — Patient Instructions (Addendum)
     IF you received an x-ray today, you will receive an invoice from [facility]. Please contact [facility] at [phone] with questions or concerns regarding your invoice.   IF you received labwork today, you will receive an invoice from [facility]. Please contact [facility] at [phone] with questions or concerns regarding your invoice.   Our billing staff will not be able to assist you with questions regarding bills from these companies.  You will be contacted with the lab results as soon as they are available. The fastest way to get your results is to activate your My Chart account. Instructions are located on the last page of this paperwork. If you have not heard from us regarding the results in 2 weeks, please contact this office.     Lymphadenopathy Lymphadenopathy refers to swollen or enlarged lymph glands, also called lymph nodes. Lymph glands are part of your body's defense (immune) system, which protects the body from infections, germs, and diseases. Lymph glands are found in many locations in your body, including the neck, underarm, and groin. Many things can cause lymph glands to become enlarged. When your immune system responds to germs, such as viruses or bacteria, infection-fighting cells and fluid build up. This causes the glands to grow in size. Usually, this is not something to worry about. The swelling and any soreness often go away without treatment. However, swollen lymph glands can also be caused by a number of diseases. Your health care provider may do various tests to help determine the cause. If the cause of your swollen lymph glands cannot be found, it is important to monitor your condition to make sure the swelling goes away. Follow these instructions at home: Watch your condition for any changes. The following actions may help to lessen any discomfort you are feeling:  Get plenty of rest.  Take medicines only as directed by your health care  provider. Your health care provider may recommend over-the-counter medicines for pain.  Apply moist heat compresses to the site of swollen lymph nodes as directed by your health care provider. This can help reduce any pain.  Check your lymph nodes daily for any changes.  Keep all follow-up visits as directed by your health care provider. This is important.  Contact a health care provider if:  Your lymph nodes are still swollen after 2 weeks.  Your swelling increases or spreads to other areas.  Your lymph nodes are hard, seem fixed to the skin, or are growing rapidly.  Your skin over the lymph nodes is red and inflamed.  You have a fever.  You have chills.  You have fatigue.  You develop a sore throat.  You have abdominal pain.  You have weight loss.  You have night sweats. Get help right away if:  You notice fluid leaking from the area of the enlarged lymph node.  You have severe pain in any area of your body.  You have chest pain.  You have shortness of breath. This information is not intended to replace advice given to you by your health care provider. Make sure you discuss any questions you have with your health care provider. Document Released: 10/31/2007 Document Revised: 06/29/2015 Document Reviewed: 08/26/2013 Elsevier Interactive Patient Education  2018 Elsevier Inc.  

## 2017-08-13 NOTE — Progress Notes (Signed)
Candice Pratt 52 y.o.   Chief Complaint  Patient presents with  . Mass    LEFT leg in the crease    HISTORY OF PRESENT ILLNESS: This is a 52 y.o. female complaining of left inguinal area mass.  Today better than yesterday.  Minimally tender.  No other symptomatology.  HPI   Prior to Admission medications   Medication Sig Start Date End Date Taking? Authorizing Provider  cyclobenzaprine (FLEXERIL) 10 MG tablet Take 0.5-1 tablets (5-10 mg total) by mouth 3 (three) times daily as needed. 04/03/17  Yes English, Colletta [place] D, PA  levonorgestrel (MIRENA) 20 MCG/24HR IUD 1 each by Intrauterine route once.   Yes [provider]  methocarbamol (ROBAXIN-750) 750 MG tablet Take 1 tablet (750 mg total) by mouth at bedtime as needed for muscle spasms. 03/19/17  Yes Wendie Agreste, MD  naproxen sodium (ANAPROX) 220 MG tablet Take 220 mg by mouth 4 (four) times daily.    Yes [provider]  predniSONE (DELTASONE) 20 MG tablet Take 3 PO QAM x2days, 2 PO QAM x2days, 1 PO QAM x3days Patient not taking: Reported on 08/13/2017 04/03/17   Ivar Drape D, PA    No Known Allergies  Patient Active Problem List   Diagnosis Date Noted  . Lateral epicondylitis of right elbow 01/04/2017  . 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 TOBACCO ABUSE 03/31/2009  . HEMORRHOIDS, EXTERNAL 03/31/2009  . FATIGUE 03/31/2009    Past Medical History:  Diagnosis Date  . Asthma   . GERD (gastroesophageal reflux disease)   . Hiatal hernia     Past Surgical History:  Procedure Laterality Date  . KNEE SURGERY      Social History   Socioeconomic History  . Marital status: Married    Spouse name: Not on file  . Number of children: 3  . Years of education: Not on file  . Highest education level: Not on file  Occupational History    Employer: [employer]  . Financial resource strain: Not on file  . Food insecurity:    Worry: Not on file    Inability: Not on file  .  Transportation needs:    Medical: Not on file    Non-medical: Not on file  Tobacco Use  . Smoking status: Current Every Day Smoker    Packs/day: 0.50    Years: 20.00    Pack years: 10.00    Types: Cigarettes  . Smokeless tobacco: Never Used  Substance and Sexual Activity  . Alcohol use: Yes    Alcohol/week: 0.6 oz    Types: 1 Glasses of wine per week  . Drug use: No  . Sexual activity: Yes    Birth control/protection: IUD  Lifestyle  . Physical activity:    Days per week: Not on file    Minutes per session: Not on file  . Stress: Not on file  Relationships  . Social connections:    Talks on phone: Not on file    Gets together: Not on file    Attends religious service: Not on file    Active member of club or organization: Not on file    Attends meetings of clubs or organizations: Not on file    Relationship status: Not on file  . Intimate partner violence:    Fear of current or ex partner: Not on file    Emotionally abused: Not on file    Physically abused: Not on file    Forced sexual activity:  Not on file  Other Topics Concern  . Not on file  Social History Narrative  . Not on file    Family History  Problem Relation Age of Onset  . Diabetes Other   . Cancer Other   . Cancer Mother   . Diabetes Brother   . Hypertension Brother   . Stroke Maternal Grandmother      Review of Systems  Constitutional: Negative.  Negative for chills and fever.  Respiratory: Negative for cough and shortness of breath.   Gastrointestinal: Negative for diarrhea, nausea and vomiting.  Genitourinary: Negative for dysuria and hematuria.  Skin: Negative.  Negative for rash.  Neurological: Negative for dizziness and headaches.  All other systems reviewed and are negative.  Vitals:   08/13/17 1623  BP: 91/64  Pulse: (!) 102  Resp: 16  Temp: 99.2 F (37.3 C)  SpO2: 97%     Physical Exam  Constitutional: She is oriented to person, place, and time. She appears well-developed  and well-nourished.  HENT:  Head: Normocephalic.  Eyes: Pupils are equal, round, and reactive to light. EOM are normal.  Neck: Normal range of motion. Neck supple.  Cardiovascular: Normal rate.  Pulmonary/Chest: Effort normal.  Musculoskeletal: Normal range of motion.  Lymphadenopathy:       Left: No inguinal adenopathy present.  Neurological: She is alert and oriented to person, place, and time.  Psychiatric: She has a normal mood and affect. Her behavior is normal.  Vitals reviewed.    ASSESSMENT & PLAN: Candice Pratt was seen today for mass.  Diagnoses and all orders for this visit:  Lymphadenopathy, inguinal -     doxycycline (VIBRA-TABS) 100 MG tablet; Take 1 tablet (100 mg total) by mouth 2 (two) times daily for 5 days.    Patient Instructions       IF you received an x-ray today, you will receive an invoice from [facility]. Please contact [facility] at [phone] with questions or concerns regarding your invoice.   IF you received labwork today, you will receive an invoice from [facility]. Please contact [facility] at [phone] with questions or concerns regarding your invoice.   Our billing staff will not be able to assist you with questions regarding bills from these companies.  You will be contacted with the lab results as soon as they are available. The fastest way to get your results is to activate your My Chart account. Instructions are located on the last page of this paperwork. If you have not heard from [place] regarding the results in 2 weeks, please contact this office.    Lymphadenopathy Lymphadenopathy refers to swollen or enlarged lymph glands, also called lymph nodes. Lymph glands are part of your body's defense (immune) system, which protects the body from infections, germs, and diseases. Lymph glands are found in many locations in your body, including the neck, underarm, and groin. Many things can cause lymph glands to become enlarged. When  your immune system responds to germs, such as viruses or bacteria, infection-fighting cells and fluid build up. This causes the glands to grow in size. Usually, this is not something to worry about. The swelling and any soreness often go away without treatment. However, swollen lymph glands can also be caused by a number of diseases. Your health care provider may do various tests to help determine the cause. If the cause of your swollen lymph glands cannot be found, it is important to monitor your condition to make sure the swelling goes away. Follow these instructions  at home: Watch your condition for any changes. The following actions may help to lessen any discomfort you are feeling:  Get plenty of rest.  Take medicines only as directed by your health care provider. Your health care provider may recommend over-the-counter medicines for pain.  Apply moist heat compresses to the site of swollen lymph nodes as directed by your health care provider. This can help reduce any pain.  Check your lymph nodes daily for any changes.  Keep all follow-up visits as directed by your health care provider. This is important.  Contact a health care provider if:  Your lymph nodes are still swollen after 2 weeks.  Your swelling increases or spreads to other areas.  Your lymph nodes are hard, seem fixed to the skin, or are growing rapidly.  Your skin over the lymph nodes is red and inflamed.  You have a fever.  You have chills.  You have fatigue.  You develop a sore throat.  You have abdominal pain.  You have weight loss.  You have night sweats. Get help right away if:  You notice fluid leaking from the area of the enlarged lymph node.  You have severe pain in any area of your body.  You have chest pain.  You have shortness of breath. This information is not intended to replace advice given to you by your health care provider. Make sure you discuss any questions you have with your health  care provider. Document Released: 10/31/2007 Document Revised: 06/29/2015 Document Reviewed: 08/26/2013 Elsevier Interactive Patient Education  2018 Elsevier Inc.      Agustina Caroli, MD [facility]

## 2017-09-10 DIAGNOSIS — M545 Low back pain: Secondary | ICD-10-CM | POA: Diagnosis not present

## 2017-09-10 DIAGNOSIS — M5126 Other intervertebral disc displacement, lumbar region: Secondary | ICD-10-CM | POA: Diagnosis not present

## 2017-09-10 DIAGNOSIS — M4300 Spondylolysis, site unspecified: Secondary | ICD-10-CM | POA: Diagnosis not present

## 2017-09-10 DIAGNOSIS — M5416 Radiculopathy, lumbar region: Secondary | ICD-10-CM | POA: Diagnosis not present

## 2017-09-10 DIAGNOSIS — M4317 Spondylolisthesis, lumbosacral region: Secondary | ICD-10-CM | POA: Diagnosis not present

## 2017-09-10 DIAGNOSIS — R030 Elevated blood-pressure reading, without diagnosis of hypertension: Secondary | ICD-10-CM | POA: Diagnosis not present

## 2017-09-12 ENCOUNTER — Other Ambulatory Visit: Payer: Self-pay | Admitting: Neurosurgery

## 2017-09-25 ENCOUNTER — Other Ambulatory Visit: Payer: Self-pay | Admitting: *Deleted

## 2017-09-25 NOTE — Patient Outreach (Signed)
[facility]) Care Management  09/25/2017  Candice Pratt [birth year] [id number]   Subjective: Telephone call to patient's home  / mobile number, no answer, left HIPAA compliant voicemail message, and requested call back.     Objective: Per KPN (Knowledge Performance Now, point of care tool) and chart review, patient to be admitted 10/07/17 for surgery.    Patient has a history of Lymphadenopathy, inguinal, asthma, Lateral epicondylitis of right elbow, tobacco abuse, and hiatal hernia.        Assessment:  Received [facility] Preoperative  / Transition of care referral on 09/25/17.   Preoperative call follow up pending patient contact.      Plan:  RNCM will call patient for 2nd telephone outreach attempt, preoperative call follow up, within 10 business days if no return call.       Dale Strausser H. Annia Friendly, BSN, [place] Management [facility] Telephonic CM Phone: [phone] Fax: [phone]

## 2017-09-26 ENCOUNTER — Other Ambulatory Visit: Payer: Self-pay | Admitting: *Deleted

## 2017-09-26 NOTE — Patient Outreach (Addendum)
[place] [facility]) Care Management  09/26/2017  ARLIS EVERLY [birth year] [id number]   Subjective: Telephone call to patient's home / mobile number, spoke with patient, and HIPAA verified.  Discussed [facility] Care Management UMR Transition of care follow up, preoperative call follow up, patient voiced understanding, and is in agreement to both types of follow up.  Patient states she is ready for surgery on 10/07/17 at [facility] and estimated length of stay is 1 -2 nights. Patient states she is able to manage self care and has assistance as needed post hospital discharge.   Patient voices understanding of medical diagnosis, pending, and treatment plan. States she is accessing the following [facility] benefits: outpatient pharmacy (also uses local pharmacy, is aware of benefit level for local versus [facility] outpatient pharmacy), hospital indemnity (not sure if chosen, will verify benefit,  verbally given contact number for [employer] [phone], will file claim if appropriate, verbally given contact number for [place] [employer] [phone] to request itemized bill), short term disability, and has family medical leave act (FMLA) in place.   Discussed Advanced Directives, advised of [facility] Employee [employer]ual [employer] Advanced Directives document completion benefit, patient voices understanding, and  declined to access benefit at this time.   States she is concerned about when she will be able to return to work post hospital discharge because she works full time in the operating room.  States she will follow up with [place] [facility] [phone]) or [facility] [phone]) regarding her disability, accommodation, and long term disability questions. Patient states she does not have any preoperative questions, care coordination, disease management, disease monitoring, transportation, community resource, or pharmacy needs at this time.  States she is very  appreciative of the follow up and is in agreement to receive [place] Management information post transition of care follow up.     Objective:Per KPN (Knowledge Performance Now, point of care tool) and chart review,patient to be admitted 10/07/17 for surgery. Patient has a history of Lymphadenopathy, inguinal, asthma,Lateral epicondylitis of right elbow, tobacco abuse, and hiatal hernia.      Assessment: Received UMR Preoperative / Transition of care referral on 09/25/17. Preoperative call completed, and transition of care follow up pending notification of patient discharge.     Plan:RNCM will call patient for  telephone outreach attempt, transition of care follow up, within 3 business days of hospital discharge notification.      Kimmy Totten H. Annia Friendly, BSN, [place] Management [facility] Telephonic CM Phone: [phone] Fax: [phone]

## 2017-09-26 NOTE — Patient Outreach (Signed)
[facility] [facility]) Care Management  09/26/2017  Candice Pratt Jan 21, 1966 [id number]   Subjective: Received voicemail message from Idell Pickles, states she is returning call, and requested call back. Telephone call to patient's home  / mobile number, no answer, left HIPAA compliant voicemail message, and requested call back.   Objective: Per KPN (Knowledge Performance Now, point of care tool) and chart review, patient to be admitted 10/07/17 for surgery.    Patient has a history of Lymphadenopathy, inguinal, asthma, Lateral epicondylitis of right elbow, tobacco abuse, and hiatal hernia.        Assessment:  Received UMR Preoperative  / Transition of care referral on 09/25/17.   Preoperative call follow up pending patient contact.      Plan:  RNCM will call patient for 3rd telephone outreach attempt, preoperative call follow up, within 10 business days if no return call.       Candice Pratt, BSN, [place] Management [facility] Telephonic CM Phone: [phone] Fax: [phone]

## 2017-09-29 ENCOUNTER — Ambulatory Visit: Payer: Self-pay | Admitting: *Deleted

## 2017-10-01 ENCOUNTER — Encounter (HOSPITAL_COMMUNITY): Payer: Self-pay

## 2017-10-01 ENCOUNTER — Encounter (HOSPITAL_COMMUNITY)
Admission: RE | Admit: 2017-10-01 | Discharge: 2017-10-01 | Disposition: A | Discharge status: Home / Self Care | Payer: 59 | Source: Ambulatory Visit | Attending: Neurosurgery | Admitting: Neurosurgery

## 2017-10-01 ENCOUNTER — Other Ambulatory Visit: Payer: Self-pay

## 2017-10-01 DIAGNOSIS — Z01812 Encounter for preprocedural laboratory examination: Secondary | ICD-10-CM | POA: Insufficient documentation

## 2017-10-01 HISTORY — DX: Unspecified chronic bronchitis: J42

## 2017-10-01 HISTORY — DX: Presence of spectacles and contact lenses: Z97.3

## 2017-10-01 HISTORY — DX: Stress incontinence (female) (male): N39.3

## 2017-10-01 HISTORY — DX: Spondylolisthesis, lumbar region: M43.16

## 2017-10-01 LAB — TYPE AND SCREEN
ABO/RH(D): A POS
Antibody Screen: NEGATIVE

## 2017-10-01 LAB — BASIC METABOLIC PANEL
Anion gap: 7 (ref 5–15)
BUN: 15 mg/dL (ref 6–20)
CO2: 31 mmol/L (ref 22–32)
CREATININE: 0.92 mg/dL (ref 0.44–1.00)
Calcium: 9.6 mg/dL (ref 8.9–10.3)
Chloride: 102 mmol/L (ref 98–111)
GFR calc Af Amer: >60 mL/min (ref >60)
GFR calc non Af Amer: >60 mL/min (ref >60)
Glucose, Bld: 83 mg/dL (ref 70–99)
Potassium: 3.7 mmol/L (ref 3.5–5.1)
SODIUM: 140 mmol/L (ref 135–145)

## 2017-10-01 LAB — ABO/RH: ABO/RH(D): A POS

## 2017-10-01 LAB — CBC
HEMATOCRIT: 39.7 % (ref 36.0–46.0)
Hemoglobin: 12.6 g/dL (ref 12.0–15.0)
MCH: 29.9 pg (ref 26.0–34.0)
MCHC: 31.7 g/dL (ref 30.0–36.0)
MCV: 94.1 fL (ref 78.0–100.0)
Platelets: 274 10*3/uL (ref 150–400)
RBC: 4.22 MIL/uL (ref 3.87–5.11)
RDW: 13.3 % (ref 11.5–15.5)
WBC: 6.4 10*3/uL (ref 4.0–10.5)

## 2017-10-01 LAB — SURGICAL PCR SCREEN
MRSA, PCR: NEGATIVE
Staphylococcus aureus: NEGATIVE

## 2017-10-01 NOTE — Pre-Procedure Instructions (Signed)
   Candice Pratt  10/01/2017     [id number] - [place], [place] [address] Phone: [phone] Fax: [phone]   Your procedure is scheduled on Tuesday, October 07, 2017  Report to [facility] Admitting at 10:30 A.M.  Call this number if you have problems the morning of surgery:  [phone]   Remember:  Do not eat or drink after midnight Monday, October 06, 2017  Take these medicines the morning of surgery with A SIP OF WATER :  If needed: traMADol (ULTRAM) for pain, Polyethylene Glycol 400 (BLINK TEARS ) for dry eyes Stop taking Aspirin (unlesss advised otherwise by your surgeon), vitamins, fish oil and herbal medications. Do not take any NSAIDs ie: Ibuprofen, Advil, Naproxen (Anaprox/Aleve), Motrin, BC and Goody Powder; stop now.  Do not wear jewelry, make-up or nail polish.  Do not wear lotions, powders, or perfumes, or deodorant.  Do not shave 48 hours prior to surgery.   Do not bring valuables to the hospital.  [facility] is not responsible for any belongings or valuables.  Contacts, dentures or bridgework may not be worn into surgery.  Leave your suitcase in the car.  After surgery it may be brought to your room.  Patients discharged the day of surgery will not be allowed to drive home.  Special instructions: See " [place]-Preparing For Surgery" sheet Please read over the following fact sheets that you were given. Pain Booklet, Coughing and Deep Breathing, MRSA Information and Surgical Site Infection Prevention

## 2017-10-01 NOTE — Progress Notes (Signed)
Pt denies SOB, chest pain, and being under the care of a cardiologist. Pt denies having a stress test, echo and cardiac cath. Pt denies having an EKG and chest x ray within the last year. Pt denies recent labs. Myra Gianotti, PA, Anesthesia, advised " okay to get BMP for baseline if she has not had labs in the last 6 months. "

## 2017-10-07 ENCOUNTER — Other Ambulatory Visit: Payer: Self-pay

## 2017-10-07 ENCOUNTER — Inpatient Hospital Stay (HOSPITAL_COMMUNITY)
Admission: RE | Disposition: A | Discharge status: Home / Self Care | Payer: Self-pay | Source: Home / Self Care | Attending: Neurosurgery

## 2017-10-07 ENCOUNTER — Encounter (HOSPITAL_COMMUNITY): Payer: Self-pay

## 2017-10-07 ENCOUNTER — Inpatient Hospital Stay (HOSPITAL_COMMUNITY): Payer: 59 | Admitting: Anesthesiology

## 2017-10-07 ENCOUNTER — Inpatient Hospital Stay (HOSPITAL_COMMUNITY): Payer: 59

## 2017-10-07 ENCOUNTER — Inpatient Hospital Stay (HOSPITAL_COMMUNITY)
Admission: RE | Admit: 2017-10-07 | Discharge: 2017-10-08 | DRG: 455 | Disposition: A | Discharge status: Home / Self Care | Payer: 59 | Attending: Neurosurgery | Admitting: Neurosurgery

## 2017-10-07 ENCOUNTER — Inpatient Hospital Stay (HOSPITAL_COMMUNITY): Payer: 59 | Admitting: Vascular Surgery

## 2017-10-07 DIAGNOSIS — M4306 Spondylolysis, lumbar region: Secondary | ICD-10-CM | POA: Diagnosis present

## 2017-10-07 DIAGNOSIS — N3946 Mixed incontinence: Secondary | ICD-10-CM | POA: Diagnosis present

## 2017-10-07 DIAGNOSIS — M4317 Spondylolisthesis, lumbosacral region: Principal | ICD-10-CM | POA: Diagnosis present

## 2017-10-07 DIAGNOSIS — F17210 Nicotine dependence, cigarettes, uncomplicated: Secondary | ICD-10-CM | POA: Diagnosis present

## 2017-10-07 DIAGNOSIS — Z981 Arthrodesis status: Secondary | ICD-10-CM | POA: Diagnosis not present

## 2017-10-07 DIAGNOSIS — Z419 Encounter for procedure for purposes other than remedying health state, unspecified: Secondary | ICD-10-CM

## 2017-10-07 DIAGNOSIS — M5416 Radiculopathy, lumbar region: Secondary | ICD-10-CM | POA: Diagnosis present

## 2017-10-07 DIAGNOSIS — Z888 Allergy status to other drugs, medicaments and biological substances status: Secondary | ICD-10-CM

## 2017-10-07 DIAGNOSIS — M549 Dorsalgia, unspecified: Secondary | ICD-10-CM | POA: Diagnosis present

## 2017-10-07 DIAGNOSIS — Z91048 Other nonmedicinal substance allergy status: Secondary | ICD-10-CM

## 2017-10-07 DIAGNOSIS — K219 Gastro-esophageal reflux disease without esophagitis: Secondary | ICD-10-CM | POA: Diagnosis not present

## 2017-10-07 DIAGNOSIS — M5417 Radiculopathy, lumbosacral region: Secondary | ICD-10-CM | POA: Diagnosis not present

## 2017-10-07 LAB — POCT PREGNANCY, URINE: Preg Test, Ur: NEGATIVE

## 2017-10-07 SURGERY — POSTERIOR LUMBAR FUSION 1 LEVEL
Anesthesia: General | Site: Back

## 2017-10-07 MED ORDER — METHOCARBAMOL 1000 MG/10ML IJ SOLN
750.0000 mg | Freq: Once | INTRAVENOUS | Status: AC
  Administered 2017-10-07: 750 mg via INTRAVENOUS
  Filled 2017-10-07: qty 7.5

## 2017-10-07 MED ORDER — 0.9 % SODIUM CHLORIDE (POUR BTL) OPTIME
TOPICAL | Status: DC | PRN
  Administered 2017-10-07: 1000 mL

## 2017-10-07 MED ORDER — THROMBIN (RECOMBINANT) 20000 UNITS EX SOLR
CUTANEOUS | Status: AC
  Filled 2017-10-07: qty 20000

## 2017-10-07 MED ORDER — ONDANSETRON HCL 4 MG/2ML IJ SOLN: 4.0000 mg | Freq: Four times a day (QID) | INTRAMUSCULAR | Status: DC | PRN

## 2017-10-07 MED ORDER — PANTOPRAZOLE SODIUM 40 MG PO TBEC: 40.0000 mg | DELAYED_RELEASE_TABLET | Freq: Every day | ORAL | Status: DC

## 2017-10-07 MED ORDER — POLYETHYLENE GLYCOL 3350 17 G PO PACK: 17.0000 g | PACK | Freq: Every day | ORAL | Status: DC | PRN

## 2017-10-07 MED ORDER — LIDOCAINE-EPINEPHRINE 1 %-1:100000 IJ SOLN
INTRAMUSCULAR | Status: DC | PRN
  Administered 2017-10-07: 5 mL

## 2017-10-07 MED ORDER — DOCUSATE SODIUM 100 MG PO CAPS
100.0000 mg | ORAL_CAPSULE | Freq: Two times a day (BID) | ORAL | Status: DC
  Administered 2017-10-07 – 2017-10-08 (×2): 100 mg via ORAL
  Filled 2017-10-07 (×2): qty 1

## 2017-10-07 MED ORDER — HYDROCODONE-ACETAMINOPHEN 5-325 MG PO TABS: 1.0000 | ORAL_TABLET | ORAL | Status: DC | PRN

## 2017-10-07 MED ORDER — OXYCODONE HCL 5 MG/5ML PO SOLN: 5.0000 mg | Freq: Once | ORAL | Status: DC | PRN

## 2017-10-07 MED ORDER — ONDANSETRON HCL 4 MG/2ML IJ SOLN
INTRAMUSCULAR | Status: DC | PRN
  Administered 2017-10-07: 4 mg via INTRAVENOUS

## 2017-10-07 MED ORDER — FAMOTIDINE 20 MG PO TABS
20.0000 mg | ORAL_TABLET | Freq: Two times a day (BID) | ORAL | Status: DC
  Administered 2017-10-07 – 2017-10-08 (×2): 20 mg via ORAL
  Filled 2017-10-07 (×2): qty 1

## 2017-10-07 MED ORDER — TRAMADOL HCL 50 MG PO TABS: 50.0000 mg | ORAL_TABLET | Freq: Four times a day (QID) | ORAL | Status: DC | PRN

## 2017-10-07 MED ORDER — SODIUM CHLORIDE 0.9 % IV SOLN: 250.0000 mL | INTRAVENOUS | Status: DC

## 2017-10-07 MED ORDER — SODIUM CHLORIDE 0.9% FLUSH: 3.0000 mL | INTRAVENOUS | Status: DC | PRN

## 2017-10-07 MED ORDER — FLEET ENEMA 7-19 GM/118ML RE ENEM: 1.0000 | ENEMA | Freq: Once | RECTAL | Status: DC | PRN

## 2017-10-07 MED ORDER — METHOCARBAMOL 1000 MG/10ML IJ SOLN: 500.0000 mg | Freq: Four times a day (QID) | INTRAVENOUS | Status: DC | PRN

## 2017-10-07 MED ORDER — CHLORHEXIDINE GLUCONATE CLOTH 2 % EX PADS: 6.0000 | MEDICATED_PAD | Freq: Once | CUTANEOUS | Status: DC

## 2017-10-07 MED ORDER — LEVONORGESTREL 20 MCG/24HR IU IUD: 1.0000 | INTRAUTERINE_SYSTEM | Freq: Once | INTRAUTERINE | Status: DC

## 2017-10-07 MED ORDER — OXYCODONE HCL 5 MG PO TABS
ORAL_TABLET | ORAL | Status: AC
  Filled 2017-10-07: qty 2

## 2017-10-07 MED ORDER — LACTATED RINGERS IV SOLN
INTRAVENOUS | Status: DC | PRN
  Administered 2017-10-07 (×2): via INTRAVENOUS

## 2017-10-07 MED ORDER — ACETAMINOPHEN 10 MG/ML IV SOLN
INTRAVENOUS | Status: AC
  Filled 2017-10-07: qty 100

## 2017-10-07 MED ORDER — KCL IN DEXTROSE-NACL 20-5-0.45 MEQ/L-%-% IV SOLN
INTRAVENOUS | Status: DC
  Administered 2017-10-07: 18:00:00 via INTRAVENOUS
  Filled 2017-10-07: qty 1000

## 2017-10-07 MED ORDER — LIDOCAINE 2% (20 MG/ML) 5 ML SYRINGE
INTRAMUSCULAR | Status: DC | PRN
  Administered 2017-10-07: 60 mg via INTRAVENOUS

## 2017-10-07 MED ORDER — ROCURONIUM BROMIDE 50 MG/5ML IV SOSY
PREFILLED_SYRINGE | INTRAVENOUS | Status: AC
  Filled 2017-10-07: qty 20

## 2017-10-07 MED ORDER — MIDAZOLAM HCL 5 MG/5ML IJ SOLN
INTRAMUSCULAR | Status: DC | PRN
  Administered 2017-10-07: 2 mg via INTRAVENOUS

## 2017-10-07 MED ORDER — ACETAMINOPHEN 650 MG RE SUPP: 650.0000 mg | RECTAL | Status: DC | PRN

## 2017-10-07 MED ORDER — PHENOL 1.4 % MT LIQD: 1.0000 | OROMUCOSAL | Status: DC | PRN

## 2017-10-07 MED ORDER — HYDROMORPHONE HCL 1 MG/ML IJ SOLN
INTRAMUSCULAR | Status: AC
  Filled 2017-10-07: qty 1

## 2017-10-07 MED ORDER — BUPIVACAINE HCL (PF) 0.5 % IJ SOLN
INTRAMUSCULAR | Status: AC
  Filled 2017-10-07: qty 30

## 2017-10-07 MED ORDER — ROCURONIUM BROMIDE 50 MG/5ML IV SOSY
PREFILLED_SYRINGE | INTRAVENOUS | Status: DC | PRN
  Administered 2017-10-07: 50 mg via INTRAVENOUS
  Administered 2017-10-07: 20 mg via INTRAVENOUS

## 2017-10-07 MED ORDER — FAMOTIDINE IN NACL 20-0.9 MG/50ML-% IV SOLN: 20.0000 mg | Freq: Two times a day (BID) | INTRAVENOUS | Status: DC

## 2017-10-07 MED ORDER — THROMBIN 5000 UNITS EX SOLR
CUTANEOUS | Status: AC
  Filled 2017-10-07: qty 5000

## 2017-10-07 MED ORDER — LIDOCAINE 2% (20 MG/ML) 5 ML SYRINGE
INTRAMUSCULAR | Status: AC
  Filled 2017-10-07: qty 10

## 2017-10-07 MED ORDER — ONDANSETRON HCL 4 MG PO TABS: 4.0000 mg | ORAL_TABLET | Freq: Four times a day (QID) | ORAL | Status: DC | PRN

## 2017-10-07 MED ORDER — DEXAMETHASONE SODIUM PHOSPHATE 10 MG/ML IJ SOLN
INTRAMUSCULAR | Status: DC | PRN
  Administered 2017-10-07: 10 mg via INTRAVENOUS

## 2017-10-07 MED ORDER — THROMBIN 20000 UNITS EX SOLR
CUTANEOUS | Status: DC | PRN
  Administered 2017-10-07: 12:00:00 via TOPICAL

## 2017-10-07 MED ORDER — METHOCARBAMOL 750 MG PO TABS: 750.0000 mg | ORAL_TABLET | Freq: Every evening | ORAL | Status: DC | PRN

## 2017-10-07 MED ORDER — ACETAMINOPHEN 325 MG PO TABS: 650.0000 mg | ORAL_TABLET | ORAL | Status: DC | PRN

## 2017-10-07 MED ORDER — CYCLOBENZAPRINE HCL 5 MG PO TABS: 5.0000 mg | ORAL_TABLET | Freq: Three times a day (TID) | ORAL | Status: DC | PRN

## 2017-10-07 MED ORDER — FENTANYL CITRATE (PF) 250 MCG/5ML IJ SOLN
INTRAMUSCULAR | Status: AC
  Filled 2017-10-07: qty 5

## 2017-10-07 MED ORDER — ALUM & MAG HYDROXIDE-SIMETH 200-200-20 MG/5ML PO SUSP: 30.0000 mL | Freq: Four times a day (QID) | ORAL | Status: DC | PRN

## 2017-10-07 MED ORDER — DIAZEPAM 5 MG PO TABS
5.0000 mg | ORAL_TABLET | Freq: Two times a day (BID) | ORAL | Status: DC | PRN
  Administered 2017-10-07: 5 mg via ORAL
  Filled 2017-10-07: qty 1

## 2017-10-07 MED ORDER — FENTANYL CITRATE (PF) 100 MCG/2ML IJ SOLN
INTRAMUSCULAR | Status: DC | PRN
  Administered 2017-10-07 (×2): 50 ug via INTRAVENOUS
  Administered 2017-10-07: 150 ug via INTRAVENOUS

## 2017-10-07 MED ORDER — BUPIVACAINE LIPOSOME 1.3 % IJ SUSP
INTRAMUSCULAR | Status: DC | PRN
  Administered 2017-10-07: 20 mL

## 2017-10-07 MED ORDER — METHOCARBAMOL 750 MG PO TABS
750.0000 mg | ORAL_TABLET | Freq: Four times a day (QID) | ORAL | Status: DC | PRN
  Administered 2017-10-08 (×2): 750 mg via ORAL
  Filled 2017-10-07 (×2): qty 1

## 2017-10-07 MED ORDER — PROPOFOL 10 MG/ML IV BOLUS
INTRAVENOUS | Status: AC
  Filled 2017-10-07: qty 20

## 2017-10-07 MED ORDER — ACETAMINOPHEN 10 MG/ML IV SOLN
INTRAVENOUS | Status: DC | PRN
  Administered 2017-10-07: 1000 mg via INTRAVENOUS

## 2017-10-07 MED ORDER — METAXALONE 400 MG HALF TABLET
400.0000 mg | ORAL_TABLET | Freq: Three times a day (TID) | ORAL | Status: DC | PRN
  Filled 2017-10-07: qty 2

## 2017-10-07 MED ORDER — DEXAMETHASONE SODIUM PHOSPHATE 10 MG/ML IJ SOLN
INTRAMUSCULAR | Status: AC
  Filled 2017-10-07: qty 1

## 2017-10-07 MED ORDER — BUPIVACAINE HCL (PF) 0.5 % IJ SOLN
INTRAMUSCULAR | Status: DC | PRN
  Administered 2017-10-07: 5 mL

## 2017-10-07 MED ORDER — METHOCARBAMOL 500 MG PO TABS
500.0000 mg | ORAL_TABLET | Freq: Four times a day (QID) | ORAL | Status: DC | PRN
  Administered 2017-10-07: 500 mg via ORAL

## 2017-10-07 MED ORDER — POLYVINYL ALCOHOL 1.4 % OP SOLN
1.0000 [drp] | OPHTHALMIC | Status: DC | PRN
  Filled 2017-10-07: qty 15

## 2017-10-07 MED ORDER — OMEPRAZOLE MAGNESIUM 20 MG PO TBEC: 20.0000 mg | DELAYED_RELEASE_TABLET | Freq: Every day | ORAL | Status: DC

## 2017-10-07 MED ORDER — MIDAZOLAM HCL 2 MG/2ML IJ SOLN
INTRAMUSCULAR | Status: AC
  Filled 2017-10-07: qty 2

## 2017-10-07 MED ORDER — SUGAMMADEX SODIUM 200 MG/2ML IV SOLN
INTRAVENOUS | Status: DC | PRN
  Administered 2017-10-07: 200 mg via INTRAVENOUS

## 2017-10-07 MED ORDER — SODIUM CHLORIDE 0.9% FLUSH: 3.0000 mL | Freq: Two times a day (BID) | INTRAVENOUS | Status: DC

## 2017-10-07 MED ORDER — LACTATED RINGERS IV SOLN
INTRAVENOUS | Status: DC
  Administered 2017-10-07: 11:00:00 via INTRAVENOUS

## 2017-10-07 MED ORDER — PROPOFOL 10 MG/ML IV BOLUS
INTRAVENOUS | Status: DC | PRN
  Administered 2017-10-07: 200 mg via INTRAVENOUS
  Administered 2017-10-07: 40 mg via INTRAVENOUS

## 2017-10-07 MED ORDER — HYDROMORPHONE HCL 1 MG/ML IJ SOLN
0.2500 mg | INTRAMUSCULAR | Status: DC | PRN
  Administered 2017-10-07 (×4): 0.5 mg via INTRAVENOUS

## 2017-10-07 MED ORDER — METHOCARBAMOL 500 MG PO TABS
ORAL_TABLET | ORAL | Status: AC
  Filled 2017-10-07: qty 1

## 2017-10-07 MED ORDER — CEFAZOLIN SODIUM-DEXTROSE 2-4 GM/100ML-% IV SOLN
2.0000 g | INTRAVENOUS | Status: AC
  Administered 2017-10-07: 2 g via INTRAVENOUS

## 2017-10-07 MED ORDER — POLYETHYLENE GLYCOL 400 0.25 % OP SOLN: Freq: Every day | OPHTHALMIC | Status: DC | PRN

## 2017-10-07 MED ORDER — BISACODYL 10 MG RE SUPP: 10.0000 mg | Freq: Every day | RECTAL | Status: DC | PRN

## 2017-10-07 MED ORDER — SODIUM CHLORIDE 0.9 % IV SOLN
INTRAVENOUS | Status: DC | PRN
  Administered 2017-10-07: 20 ug/min via INTRAVENOUS

## 2017-10-07 MED ORDER — CEFAZOLIN SODIUM-DEXTROSE 2-4 GM/100ML-% IV SOLN
2.0000 g | Freq: Three times a day (TID) | INTRAVENOUS | Status: AC
  Administered 2017-10-07 (×2): 2 g via INTRAVENOUS
  Filled 2017-10-07 (×2): qty 100

## 2017-10-07 MED ORDER — MORPHINE SULFATE (PF) 2 MG/ML IV SOLN
2.0000 mg | INTRAVENOUS | Status: DC | PRN
  Administered 2017-10-07: 2 mg via INTRAVENOUS
  Filled 2017-10-07: qty 1

## 2017-10-07 MED ORDER — ONDANSETRON HCL 4 MG/2ML IJ SOLN
INTRAMUSCULAR | Status: AC
  Filled 2017-10-07: qty 4

## 2017-10-07 MED ORDER — SENNA 8.6 MG PO TABS
1.0000 | ORAL_TABLET | Freq: Every day | ORAL | Status: DC
  Administered 2017-10-07: 8.6 mg via ORAL
  Filled 2017-10-07: qty 1

## 2017-10-07 MED ORDER — CEFAZOLIN SODIUM-DEXTROSE 2-4 GM/100ML-% IV SOLN
INTRAVENOUS | Status: AC
  Filled 2017-10-07: qty 100

## 2017-10-07 MED ORDER — THROMBIN 5000 UNITS EX SOLR
OROMUCOSAL | Status: DC | PRN
  Administered 2017-10-07: 11:00:00 via TOPICAL

## 2017-10-07 MED ORDER — BUPIVACAINE LIPOSOME 1.3 % IJ SUSP
20.0000 mL | Freq: Once | INTRAMUSCULAR | Status: DC
  Filled 2017-10-07: qty 20

## 2017-10-07 MED ORDER — OXYCODONE HCL 5 MG PO TABS: 5.0000 mg | ORAL_TABLET | Freq: Once | ORAL | Status: DC | PRN

## 2017-10-07 MED ORDER — LIDOCAINE-EPINEPHRINE 1 %-1:100000 IJ SOLN
INTRAMUSCULAR | Status: AC
  Filled 2017-10-07: qty 1

## 2017-10-07 MED ORDER — OXYCODONE HCL 5 MG PO TABS
10.0000 mg | ORAL_TABLET | ORAL | Status: DC | PRN
  Administered 2017-10-07 – 2017-10-08 (×6): 10 mg via ORAL
  Filled 2017-10-07 (×5): qty 2

## 2017-10-07 MED ORDER — ZOLPIDEM TARTRATE 5 MG PO TABS: 5.0000 mg | ORAL_TABLET | Freq: Every evening | ORAL | Status: DC | PRN

## 2017-10-07 MED ORDER — MENTHOL 3 MG MT LOZG: 1.0000 | LOZENGE | OROMUCOSAL | Status: DC | PRN

## 2017-10-07 SURGICAL SUPPLY — 86 items
ADH SKN CLS APL DERMABOND .7 (GAUZE/BANDAGES/DRESSINGS) ×1
BASKET BONE COLLECTION (BASKET) ×2 IMPLANT
BLADE CLIPPER SURG (BLADE) IMPLANT
BONE CANC CHIPS 20CC PCAN1/4 (Bone Implant) ×2 IMPLANT
BUR MATCHSTICK NEURO 3.0 LAGG (BURR) ×2 IMPLANT
BUR PRECISION FLUTE 5.0 (BURR) ×2 IMPLANT
CAGE COROENT MP 8X9X23M-8 SPIN (Cage) ×2 IMPLANT
CANISTER SUCT 3000ML PPV (MISCELLANEOUS) ×3 IMPLANT
CARTRIDGE OIL MAESTRO DRILL (MISCELLANEOUS) ×1 IMPLANT
CHIPS CANC BONE 20CC PCAN1/4 (Bone Implant) ×1 IMPLANT
CONT SPEC 4OZ CLIKSEAL STRL BL (MISCELLANEOUS) ×2 IMPLANT
COVER BACK TABLE 60X90IN (DRAPES) ×2 IMPLANT
DECANTER SPIKE VIAL GLASS SM (MISCELLANEOUS) ×3 IMPLANT
DERMABOND ADVANCED (GAUZE/BANDAGES/DRESSINGS) ×1
DERMABOND ADVANCED .7 DNX12 (GAUZE/BANDAGES/DRESSINGS) ×1 IMPLANT
DIFFUSER DRILL AIR PNEUMATIC (MISCELLANEOUS) ×2 IMPLANT
DRAPE C-ARM 42X72 X-RAY (DRAPES) ×2 IMPLANT
DRAPE C-ARMOR (DRAPES) ×2 IMPLANT
DRAPE LAPAROTOMY 100X72X124 (DRAPES) ×2 IMPLANT
DRAPE SURG 17X23 STRL (DRAPES) ×2 IMPLANT
DRSG OPSITE POSTOP 4X6 (GAUZE/BANDAGES/DRESSINGS) IMPLANT
DRSG OPSITE POSTOP 4X8 (GAUZE/BANDAGES/DRESSINGS) ×1 IMPLANT
DURAPREP 26ML APPLICATOR (WOUND CARE) ×2 IMPLANT
ELECT REM PT RETURN 9FT ADLT (ELECTROSURGICAL) ×2
ELECTRODE REM PT RTRN 9FT ADLT (ELECTROSURGICAL) ×1 IMPLANT
EVACUATOR 1/8 PVC DRAIN (DRAIN) IMPLANT
GAUZE 4X4 16PLY RFD (DISPOSABLE) IMPLANT
GAUZE SPONGE 4X4 12PLY STRL (GAUZE/BANDAGES/DRESSINGS) ×1 IMPLANT
GLOVE BIO SURGEON STRL SZ7.5 (GLOVE) ×1 IMPLANT
GLOVE BIO SURGEON STRL SZ8 (GLOVE) ×4 IMPLANT
GLOVE BIOGEL PI IND STRL 6.5 (GLOVE) IMPLANT
GLOVE BIOGEL PI IND STRL 7.0 (GLOVE) IMPLANT
GLOVE BIOGEL PI IND STRL 7.5 (GLOVE) IMPLANT
GLOVE BIOGEL PI IND STRL 8 (GLOVE) ×2 IMPLANT
GLOVE BIOGEL PI IND STRL 8.5 (GLOVE) ×2 IMPLANT
GLOVE BIOGEL PI INDICATOR 6.5 (GLOVE) ×1
GLOVE BIOGEL PI INDICATOR 7.0 (GLOVE) ×3
GLOVE BIOGEL PI INDICATOR 7.5 (GLOVE) ×1
GLOVE BIOGEL PI INDICATOR 8 (GLOVE) ×2
GLOVE BIOGEL PI INDICATOR 8.5 (GLOVE) ×2
GLOVE ECLIPSE 8.0 STRL XLNG CF (GLOVE) ×4 IMPLANT
GLOVE EXAM NITRILE LRG STRL (GLOVE) IMPLANT
GLOVE EXAM NITRILE XL STR (GLOVE) IMPLANT
GLOVE EXAM NITRILE XS STR PU (GLOVE) IMPLANT
GLOVE SURG SS PI 6.5 STRL IVOR (GLOVE) ×1 IMPLANT
GOWN STRL REUS W/ TWL LRG LVL3 (GOWN DISPOSABLE) IMPLANT
GOWN STRL REUS W/ TWL XL LVL3 (GOWN DISPOSABLE) ×2 IMPLANT
GOWN STRL REUS W/TWL 2XL LVL3 (GOWN DISPOSABLE) ×4 IMPLANT
GOWN STRL REUS W/TWL LRG LVL3 (GOWN DISPOSABLE) ×4
GOWN STRL REUS W/TWL XL LVL3 (GOWN DISPOSABLE) ×8
GRAFT BNE CANC CHIPS 1-8 20CC (Bone Implant) IMPLANT
HEMOSTAT POWDER KIT SURGIFOAM (HEMOSTASIS) ×2 IMPLANT
KIT BASIN OR (CUSTOM PROCEDURE TRAY) ×2 IMPLANT
KIT POSITION SURG JACKSON T1 (MISCELLANEOUS) ×2 IMPLANT
KIT TURNOVER KIT B (KITS) ×2 IMPLANT
MILL MEDIUM DISP (BLADE) ×1 IMPLANT
NDL HYPO 21X1.5 SAFETY (NEEDLE) IMPLANT
NDL HYPO 25X1 1.5 SAFETY (NEEDLE) ×1 IMPLANT
NDL SPNL 18GX3.5 QUINCKE PK (NEEDLE) IMPLANT
NEEDLE HYPO 21X1.5 SAFETY (NEEDLE) ×2 IMPLANT
NEEDLE HYPO 25X1 1.5 SAFETY (NEEDLE) ×2 IMPLANT
NEEDLE SPNL 18GX3.5 QUINCKE PK (NEEDLE) IMPLANT
NS IRRIG 1000ML POUR BTL (IV SOLUTION) ×2 IMPLANT
OIL CARTRIDGE MAESTRO DRILL (MISCELLANEOUS) ×2
PACK LAMINECTOMY NEURO (CUSTOM PROCEDURE TRAY) ×2 IMPLANT
PAD ARMBOARD 7.5X6 YLW CONV (MISCELLANEOUS) ×6 IMPLANT
PATTIES SURGICAL .5 X.5 (GAUZE/BANDAGES/DRESSINGS) IMPLANT
PATTIES SURGICAL .5 X1 (DISPOSABLE) IMPLANT
PATTIES SURGICAL 1X1 (DISPOSABLE) IMPLANT
ROD RELINE LORDOTIC 5.5X45 (Rod) ×2 IMPLANT
SCREW LOCK RELINE 5.5 TULIP (Screw) ×4 IMPLANT
SCREW RELINE-O POLY 6.5X40 (Screw) ×2 IMPLANT
SCREW RELINE-O POLY 6.5X50MM (Screw) ×2 IMPLANT
SPONGE LAP 4X18 RFD (DISPOSABLE) IMPLANT
SPONGE SURGIFOAM ABS GEL 100 (HEMOSTASIS) ×1 IMPLANT
STAPLER SKIN PROX WIDE 3.9 (STAPLE) IMPLANT
SUT VIC AB 1 CT1 18XBRD ANBCTR (SUTURE) ×2 IMPLANT
SUT VIC AB 1 CT1 8-18 (SUTURE) ×2
SUT VIC AB 2-0 CT1 18 (SUTURE) ×3 IMPLANT
SUT VIC AB 3-0 SH 8-18 (SUTURE) ×3 IMPLANT
SYR 5ML LL (SYRINGE) IMPLANT
SYRINGE 20CC LL (MISCELLANEOUS) ×1 IMPLANT
TOWEL GREEN STERILE (TOWEL DISPOSABLE) ×2 IMPLANT
TOWEL GREEN STERILE FF (TOWEL DISPOSABLE) ×2 IMPLANT
TRAY FOLEY MTR SLVR 16FR STAT (SET/KITS/TRAYS/PACK) ×2 IMPLANT
WATER STERILE IRR 1000ML POUR (IV SOLUTION) ×2 IMPLANT

## 2017-10-07 NOTE — Progress Notes (Signed)
Awake, alert, conversant.  MAEW with good strength.  Doing well. 

## 2017-10-07 NOTE — Anesthesia Postprocedure Evaluation (Signed)
Anesthesia Post Note  Patient: Candice Pratt  Procedure(s) Performed: Lumbar five-Sacral one Posterior Lumbar Interbody Fusion (N/A Back)     Patient location during evaluation: PACU Anesthesia Type: General Level of consciousness: awake and alert Pain management: pain level controlled Vital Signs Assessment: post-procedure vital signs reviewed and stable Respiratory status: spontaneous breathing, nonlabored ventilation, respiratory function stable and patient connected to nasal cannula oxygen Cardiovascular status: blood pressure returned to baseline and stable Postop Assessment: no apparent nausea or vomiting Anesthetic complications: no    Last Vitals:  Vitals:   10/07/17 1542 10/07/17 1545  BP: (!) 114/94   Pulse:  69  Resp:  13  Temp:    SpO2:  100%    Last Pain:  Vitals:   10/07/17 1545  TempSrc:   PainSc: 10-Worst pain ever                 Kasy Iannacone S

## 2017-10-07 NOTE — Interval H&P Note (Signed)
History and Physical Interval Note:  10/07/2017 11:43 AM  Candice Pratt  has presented today for surgery, with the diagnosis of Spondylolisthesis  The various methods of treatment have been discussed with the patient and family. After consideration of risks, benefits and other options for treatment, the patient has consented to  Procedure(s): Lumbar five-Sacral one Posterior Lumbar Interbody Fusion (N/A) as a surgical intervention .  The patient's history has been reviewed, patient examined, no change in status, stable for surgery.  I have reviewed the patient's chart and labs.  Questions were answered to the patient's satisfaction.     Nakiya Rallis D

## 2017-10-07 NOTE — H&P (Signed)
Patient ID:   [id number] Patient: Candice Pratt  Date of Birth: May 20, 1965 Visit Type: Office Visit   Date: 09/10/2017 03:30 PM Provider: Marchia Meiers. Vertell Limber MD   This 52 year old female presents for back pain.  HISTORY OF PRESENT ILLNESS:  1.  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  Patient reports long history of periodic low back pain, noting increased symptoms over the past 2 years.  Since December 2018, patient finds it difficult to ascend stairs and reports left leg "heaviness" causing some difficulty entering her vehicle after work.  She has decreased her work to 8 hour shifts as a result of pain and weakness.  She notes increased stress and urge incontinence over the last 10 days.  Physical therapy 2017 with home exercises thereafter offered some relief until December 2018 Yoga, ice, stretches performed with limited benefit Chiropractor offered no relief  Robaxin 750 mg taken only as needed Flexeril 10 mg taken only as needed Aleve 1-2 taken only as needed  History:  Healthy Surgical history:  Left knee scope meniscus repair 2004  MRI and x-rays on [place]          PAST MEDICAL/SURGICAL HISTORY:   (Detailed)       Family History:  (Detailed)   Social History:  (Detailed) Tobacco use reviewed. Preferred language is [place].   Tobacco use status: Moderate cigarette smoker (10-19 cigs/day). Smoking status: Heavy tobacco smoker.  SMOKING STATUS Type Smoking Status Usage Per Day Years Used Total Pack Years  Cigarette Heavy tobacco smoker 0.5 Packs     TOBACCO CESSATION INFORMATION Date Counseled By Order Status Description Code Tobacco Cessation Information  09/10/2017 Basil Dess. Prevatt Tobacco cessation counseling completed   Smoking cessation education   TOBACCO/VAPING EXPOSURE No passive vaping exposure. No passive smoke exposure.       MEDICATIONS: (added, continued or stopped  this visit) Started Medication Directions Instruction Stopped  09/10/2017 Skelaxin 800 mg tablet take 1 tablet by oral route 3 times every day as needed for spasm    09/10/2017 tramadol 50 mg tablet take 1 tablet by oral route  every 6 hours as needed       ALLERGIES: Ingredient Reaction Medication Name Comment  NO KNOWN ALLERGIES     No known allergies.   REVIEW OF SYSTEMS   See scanned patient registration form, dated, signed and dated on   Review of Systems Details System Neg/Pos Details  Constitutional Negative Chills, Fatigue, Fever, Malaise, Night sweats, Weight gain and Weight loss.  ENMT Negative Ear drainage, Hearing loss, Nasal drainage, Otalgia, Sinus pressure and Sore throat.  Eyes Negative Eye discharge, Eye pain and Vision changes.  Respiratory Negative Chronic cough, Cough, Dyspnea, Known TB exposure and Wheezing.  Cardio Negative Chest pain, Claudication, Edema and Irregular heartbeat/palpitations.  GI Negative Abdominal pain, Blood in stool, Change in stool pattern, Constipation, Decreased appetite, Diarrhea, Heartburn, Nausea and Vomiting.  GU Positive Urinary incontinence.  Endocrine Negative Cold intolerance, Heat intolerance, Polydipsia and Polyphagia.  Neuro Positive Extremity weakness, Gait disturbance, Numbness in extremity.  Psych Negative Anxiety, Depression and Insomnia.  Integumentary Negative Brittle hair, Brittle nails, Change in shape/size of mole(s), Hair loss, Hirsutism, Hives, Pruritus, Rash and Skin lesion.  MS Positive Back pain.  Hema/Lymph Negative Easy bleeding, Easy bruising and Lymphadenopathy.  Allergic/Immuno Negative Contact allergy, Environmental allergies, Food allergies and Seasonal allergies.  Reproductive Negative Breast discharge, Breast lumps, Dysmenorrhea, Dyspareunia, History of abnormal PAP smear,  Hot flashes, Irregular menses and Vaginal discharge.   PHYSICAL EXAM:   Vitals Date Temp F BP Pulse Ht In Wt Lb BMI BSA Pain Score   09/10/2017  126/79 85 70 184 26.4  8/10    PHYSICAL EXAM Details General Level of Distress: no acute distress Overall Appearance: normal  Head and Face  Right Left  Fundoscopic Exam:  normal normal    Cardiovascular Cardiac: regular rate and rhythm without murmur  Right Left  Carotid Pulses: normal normal  Respiratory Lungs: clear to auscultation  Neurological Orientation: normal Recent and Remote Memory: normal Attention Span and Concentration:   normal Language: normal Fund of Knowledge: normal  Right Left Sensation: normal normal Upper Extremity Coordination: normal normal  Lower Extremity Coordination: normal normal  Musculoskeletal Gait and Station: normal  Right Left Upper Extremity Muscle Strength: normal normal Lower Extremity Muscle Strength: normal normal Upper Extremity Muscle Tone:  normal normal Lower Extremity Muscle Tone: normal normal   Motor Strength Upper and lower extremity motor strength was tested in the clinically pertinent muscles. Any abnormal findings will be noted below.   Right Left EHL:  4-/5   Deep Tendon Reflexes  Right Left Biceps: normal normal Triceps: normal normal Brachioradialis: normal normal Patellar: normal normal Achilles: normal normal  Cranial Nerves II. Optic Nerve/Visual Fields: normal III. Oculomotor: normal IV. Trochlear: normal V. Trigeminal: normal VI. Abducens: normal VII. Facial: normal VIII. Acoustic/Vestibular: normal IX. Glossopharyngeal: normal X. Vagus: normal XI. Spinal Accessory: normal XII. Hypoglossal: normal  Motor and other Tests Lhermittes: negative Rhomberg: negative Pronator drift: absent     Right Left Hoffman's: normal normal Clonus: normal normal Babinski: normal normal SLR:  positive at 10 degrees   Additional Findings:  Upon examination, weakness noted in left leg, hip abductor weakness, decreased pin sensitivity in left leg.    IMPRESSION:   L-spine MRI  reveals grade 2 spondylolisthesis at L5-S1 and an annular tear at L4-5. 4-view L-spine X-rays reveal L5-S1 slippage of 12.8 mm on neutral, 11 mm on extension, and 15 mm on flexion. Upon examination, weakness noted in left leg, 4-/5 EHL on left, positive left SLR 10 degrees, hip abductor weakness, decreased pin sensitivity on left. Recommended L5-S1 PLIF as treatment.  PLAN:  1. PLIF L5-S1 scheduled 2. Nurse education given 3, LSO brace fitting 4. Follow-up 3 weeks post op for AP/Lat L-spine X-rays  Orders: Diagnostic Procedures: Assessment Procedure  M54.16 Lumbar Spine- AP/Lat  M54.16 Lumbar Spine- AP/Lat/Flex/Ex  Instruction(s)/Education: Assessment Instruction   Tobacco cessation counseling  Miscellaneous: Assessment   M43.17 Aspen Lo Sag Rigid Panel Quick   Completed Orders (this encounter) Order Details Reason Side Interpretation Result Initial Treatment Date Region  Tobacco cessation counseling         Lumbar Spine- AP/Lat/Flex/Ex      09/10/2017 All Levels to All Levels   Assessment/Plan   # Detail Type Description   1. Assessment Lumbar radiculopathy (M54.16).       2. Assessment Spondylolisthesis at L5-S1 level (M43.17).   Plan Orders Aspen Lo Sag Rigid Panel Quick.            MEDICATIONS PRESCRIBED TODAY    Rx Quantity Refills  TRAMADOL HCL 50 mg  60 0  SKELAXIN 800 mg  60 0            Provider:  Marchia Meiers. Vertell Limber MD  09/10/2017 09:26 PM Dictation edited by: Mirian Mo    CC Providers: Delos Haring [place],  [place]  [id number]   Peter Dalldorf  [address] [place], [place] [id number]               Electronically signed by Marchia Meiers. Vertell Limber MD on 09/11/2017 06:49 PM

## 2017-10-07 NOTE — Brief Op Note (Signed)
10/07/2017  3:21 PM  PATIENT:  Candice Pratt  52 y.o. 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 with spondylolysis L 5, radiculopathy, lumbago   PROCEDURE:  Procedure(s): Lumbar five-Sacral one Posterior Lumbar Interbody Fusion (N/A) Gill procedure, PLIF with PEEK cages, autograft, pedicle screw fixation, posterolateral arthrodesis L 5 S 1 level  SURGEON:  Surgeon(s) and Role:    Erline Levine, MD - Primary    * Ostergard, Joyice Faster, MD - Assisting  PHYSICIAN ASSISTANT:   ASSISTANTS: Poteat, RN   ANESTHESIA:   general  EBL:  200 mL   BLOOD ADMINISTERED:none  DRAINS: none   LOCAL MEDICATIONS USED:  MARCAINE    and LIDOCAINE   SPECIMEN:  No Specimen  DISPOSITION OF SPECIMEN:  N/A  COUNTS:  YES  TOURNIQUET:  * No tourniquets in log *   DICTATION: Patient is 52 year old woman with mobile spondylolisthesis of L 5 on S 1 with lumbar stenosis. She has a severe bilateral L5 radiculopathies.  She has a Grade 2 slip and a high slip angle. It was elected to take her to surgery for decompression and fusion at this level.   Procedure: Patient was placed in a prone position on the [place] table after smooth and uncomplicated induction of general endotracheal anesthesia. Her low back was prepped and draped in usual sterile fashion with betadine scrub and DuraPrep after marking her planned incision with C arm. Area of incision was infiltrated with local lidocaine. Incision was made to the lumbodorsal fascia was incised and exposure was performed of the L 5 through S 1 spinous processes laminae facet joint and transverse processes. Intraoperative x-ray was obtained which confirmed correct orientation. A total laminectomy of L 5 (Gill procedure) was performed with disarticulation of the facet joints at this level and thorough decompression was performed of both L 5  and S  1 nerve roots along with the common dural tube. This decompression was more involved than would be typical of that performed for PLIF alone and included painstaking dissection of adherent ligament compressing the thecal sac and wide decompression of all neural elements with removal of mobile posterior elements and bilateral par defects. A thorough discectomy was initially performed on the left with preparation of the endplates for grafting a trial spacer was placed this level and a thorough discectomy was performed on the right as well. Sequential decompression of the interspace and distraction was performed from side to side.  Eventually, we placed trials followed by cages (8 x 8 degree x 23 mm) bilaterally.  10 cc of autograft was packed in the interspace medial to the second cage.  The posterolateral region was extensively decorticated and pedicle probes were placed at L5 and S 1 bilaterally. Intraoperative fluoroscopy confirmed correct orientationin the AP and lateral plane. 40 x 6.5 mm pedicle screws were placed at S 1 bilaterally and 50 x 6.5 mm screws placed at L 5 bilaterally final x-rays demonstrated well-positioned interbody grafts and pedicle screw fixation. A 45 mm lordotic rod was placed on the right and a 45 mm rod was placed on the left locked down in situ and the posterolateral region was packed with the remaining autograft and bone graft extender bilaterally (15 cc on each side). The wound was irrigated and fascia was closed with 1 Vicryl sutures skin edges were reapproximated 2 and 3-0 Vicryl sutures. Deep musculature was injected with  long-acting Marcaine. The wound is dressed with Dermabond and an occlusive dressing.  the patient was extubated in the operating room and taken to recovery in stable satisfactory condition. She tolerated the operation well counts were correct at the end of the case.   PLAN OF CARE: Admit to inpatient   PATIENT DISPOSITION:  PACU - hemodynamically stable.    Delay start of Pharmacological VTE agent (>24hrs) due to surgical blood loss or risk of bleeding: yes

## 2017-10-07 NOTE — Anesthesia Procedure Notes (Signed)
Procedure Name: Intubation Date/Time: 10/07/2017 12:08 PM Performed by: Neldon Newport, CRNA Pre-anesthesia Checklist: Timeout performed, Patient being monitored, Suction available, Emergency Drugs available and Patient identified Patient Re-evaluated:Patient Re-evaluated prior to induction Oxygen Delivery Method: Circle system utilized Preoxygenation: Pre-oxygenation with 100% oxygen Induction Type: IV induction Ventilation: Mask ventilation without difficulty Laryngoscope Size: Mac and 3 Grade View: Grade I Tube type: Oral Tube size: 7.0 mm Number of attempts: 1 Placement Confirmation: breath sounds checked- equal and bilateral,  positive ETCO2 and ETT inserted through vocal cords under direct vision Secured at: 23 cm Tube secured with: Tape Dental Injury: Teeth and Oropharynx as per pre-operative assessment

## 2017-10-07 NOTE — Op Note (Signed)
10/07/2017  3:21 PM  PATIENT:  Candice Pratt  52 y.o. 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 with spondylolysis L 5, radiculopathy, lumbago   PROCEDURE:  Procedure(s): Lumbar five-Sacral one Posterior Lumbar Interbody Fusion (N/A) Gill procedure, PLIF with PEEK cages, autograft, pedicle screw fixation, posterolateral arthrodesis L 5 S 1 level  SURGEON:  Surgeon(s) and Role:    Erline Levine, MD - Primary    * Ostergard, Joyice Faster, MD - Assisting  PHYSICIAN ASSISTANT:   ASSISTANTS: Poteat, RN   ANESTHESIA:   general  EBL:  200 mL   BLOOD ADMINISTERED:none  DRAINS: none   LOCAL MEDICATIONS USED:  MARCAINE    and LIDOCAINE   SPECIMEN:  No Specimen  DISPOSITION OF SPECIMEN:  N/A  COUNTS:  YES  TOURNIQUET:  * No tourniquets in log *   DICTATION: Patient is 52 year old woman with mobile spondylolisthesis of L 5 on S 1 with lumbar stenosis. She has a severe bilateral L5 radiculopathies.  She has a Grade 2 slip and a high slip angle. It was elected to take her to surgery for decompression and fusion at this level.   Procedure: Patient was placed in a prone position on the [place] table after smooth and uncomplicated induction of general endotracheal anesthesia. Her low back was prepped and draped in usual sterile fashion with betadine scrub and DuraPrep after marking her planned incision with C arm. Area of incision was infiltrated with local lidocaine. Incision was made to the lumbodorsal fascia was incised and exposure was performed of the L 5 through S 1 spinous processes laminae facet joint and transverse processes. Intraoperative x-ray was obtained which confirmed correct orientation. A total laminectomy of L 5 (Gill procedure) was performed with disarticulation of the facet joints at this level and thorough decompression was performed of both L 5  and S  1 nerve roots along with the common dural tube. This decompression was more involved than would be typical of that performed for PLIF alone and included painstaking dissection of adherent ligament compressing the thecal sac and wide decompression of all neural elements with removal of mobile posterior elements and bilateral par defects. A thorough discectomy was initially performed on the left with preparation of the endplates for grafting a trial spacer was placed this level and a thorough discectomy was performed on the right as well. Sequential decompression of the interspace and distraction was performed from side to side.  Eventually, we placed trials followed by cages (8 x 8 degree x 23 mm) bilaterally.  10 cc of autograft was packed in the interspace medial to the second cage.  The posterolateral region was extensively decorticated and pedicle probes were placed at L5 and S 1 bilaterally. Intraoperative fluoroscopy confirmed correct orientationin the AP and lateral plane. 40 x 6.5 mm pedicle screws were placed at S 1 bilaterally and 50 x 6.5 mm screws placed at L 5 bilaterally final x-rays demonstrated well-positioned interbody grafts and pedicle screw fixation. A 45 mm lordotic rod was placed on the right and a 45 mm rod was placed on the left locked down in situ and the posterolateral region was packed with the remaining autograft and bone graft extender bilaterally (15 cc on each side). The wound was irrigated and fascia was closed with 1 Vicryl sutures skin edges were reapproximated 2 and 3-0 Vicryl sutures. Deep musculature was injected with  long-acting Marcaine. The wound is dressed with Dermabond and an occlusive dressing.  the patient was extubated in the operating room and taken to recovery in stable satisfactory condition. She tolerated the operation well counts were correct at the end of the case.   PLAN OF CARE: Admit to inpatient   PATIENT DISPOSITION:  PACU - hemodynamically stable.    Delay start of Pharmacological VTE agent (>24hrs) due to surgical blood loss or risk of bleeding: yes

## 2017-10-07 NOTE — Transfer of Care (Signed)
Immediate Anesthesia Transfer of Care Note  Patient: Candice Pratt  Procedure(s) Performed: Lumbar five-Sacral one Posterior Lumbar Interbody Fusion (N/A Back)  Patient Location: PACU  Anesthesia Type:General  Level of Consciousness: awake, alert  and oriented  Airway & Oxygen Therapy: Patient Spontanous Breathing and Patient connected to nasal cannula oxygen  Post-op Assessment: Report given to RN, Post -op Vital signs reviewed and stable and Patient moving all extremities X 4  Post vital signs: Reviewed and stable  Last Vitals:  Vitals Value Taken Time  BP 126/62 10/07/2017  3:12 PM  Temp    Pulse 89 10/07/2017  3:13 PM  Resp 13 10/07/2017  3:13 PM  SpO2 100 % 10/07/2017  3:13 PM  Vitals shown include unvalidated device data.  Last Pain:  Vitals:   10/07/17 1052  TempSrc:   PainSc: 5          Complications: No apparent anesthesia complications

## 2017-10-07 NOTE — Anesthesia Preprocedure Evaluation (Addendum)
Anesthesia Evaluation  Patient identified by MRN, date of birth, ID band Patient awake    Reviewed: Allergy & Precautions, H&P , NPO status , Patient's Chart, lab work & pertinent test results  Airway Mallampati: II  TM Distance: >3 FB Neck ROM: full    Dental  (+) Teeth Intact, Dental Advidsory Given   Pulmonary asthma , Current Smoker,    breath sounds clear to auscultation       Cardiovascular negative cardio ROS   Rhythm:regular Rate:Normal     Neuro/Psych  Neuromuscular disease negative neurological ROS  negative psych ROS   GI/Hepatic negative GI ROS, Neg liver ROS, hiatal hernia, GERD  ,  Endo/Other  negative endocrine ROS  Renal/GU negative Renal ROS  negative genitourinary   Musculoskeletal  (+) Arthritis ,   Abdominal   Peds  Hematology negative hematology ROS (+)   Anesthesia Other Findings   Reproductive/Obstetrics negative OB ROS                           Anesthesia Physical Anesthesia Plan  ASA: II  Anesthesia Plan: General   Post-op Pain Management:    Induction: Intravenous  PONV Risk Score and Plan: 2 and Ondansetron, Dexamethasone, Midazolam and Treatment may vary due to age or medical condition  Airway Management Planned: Oral ETT  Additional Equipment:   Intra-op Plan:   Post-operative Plan: Extubation in OR  Informed Consent: I have reviewed the patients History and Physical, chart, labs and discussed the procedure including the risks, benefits and alternatives for the proposed anesthesia with the patient or authorized representative who has indicated his/her understanding and acceptance.   Dental Advisory Given  Plan Discussed with: CRNA, Surgeon and Anesthesiologist  Anesthesia Plan Comments:        Anesthesia Quick Evaluation

## 2017-10-08 MED ORDER — PNEUMOCOCCAL VAC POLYVALENT 25 MCG/0.5ML IJ INJ: 0.5000 mL | INJECTION | INTRAMUSCULAR | Status: DC

## 2017-10-08 MED ORDER — OXYCODONE HCL 5 MG PO TABS: 5.0000 mg | ORAL_TABLET | ORAL | 0 refills | Status: DC | PRN

## 2017-10-08 MED ORDER — METHOCARBAMOL 750 MG PO TABS: 750.0000 mg | ORAL_TABLET | Freq: Four times a day (QID) | ORAL | 1 refills | Status: DC | PRN

## 2017-10-08 MED FILL — Thrombin (Recombinant) For Soln 20000 Unit: CUTANEOUS | Qty: 1 | Status: AC

## 2017-10-08 MED FILL — METHOCARBAMOL 750 MG TABS: 750 | 15 days supply | Qty: 60 | Fill #0

## 2017-10-08 MED FILL — oxyCODONE HCL 5 MG TABS: 5 | 5 days supply | Qty: 60 | Fill #0

## 2017-10-08 NOTE — Evaluation (Signed)
Occupational Therapy Evaluation Patient Details Name: Candice Pratt MRN: [id number] DOB: Apr 20, 1965 Today's Date: 10/08/2017    History of Present Illness Pt is a 52 y.o. female who is s/p L5-S1 PLIF. She has no pertinent PMH on file.    Clinical Impression   PTA, pt was independent with ADL and functional mobility. She and her husband were educated concerning all back precautions and compensatory strategies to adhere to these during dressing, showering, grooming, and toileting tasks. Pt currently able to complete all ADL with modified independence after education. Recommend 3-in-1 BSC for use over toilet to maximize safety and independence. Pt and husband verbalize and demonstrate understanding and have no further questions/concerns. No further acute OT needs identified. OT will sign off.     Follow Up Recommendations  No OT follow up    Equipment Recommendations  3 in 1 bedside commode    Recommendations for Other Services       Precautions / Restrictions Precautions Precautions: Back Precaution Booklet Issued: Yes (comment) Precaution Comments: Reviewed back precautions with pt. She is able to state 3/3.  Restrictions Weight Bearing Restrictions: No      Mobility Bed Mobility Overal bed mobility: Modified Independent             General bed mobility comments: Educated concerning log roll technique  Transfers Overall transfer level: Modified independent                    Balance Overall balance assessment: Mild deficits observed, not formally tested                                         ADL either performed or assessed with clinical judgement   ADL Overall ADL's : Modified independent                                       General ADL Comments: After education, pt was able to complete all ADL and ADL transfers with modified independence adhering to back precautions.      Vision Patient Visual Report: No change  from baseline Vision Assessment?: No apparent visual deficits     Perception     Praxis      Pertinent Vitals/Pain Pain Assessment: Faces Faces Pain Scale: Hurts a little bit Pain Location: back at incision Pain Descriptors / Indicators: Operative site guarding Pain Intervention(s): Limited activity within patient's tolerance;Monitored during session;Repositioned     Hand Dominance     Extremity/Trunk Assessment Upper Extremity Assessment Upper Extremity Assessment: Overall WFL for tasks assessed   Lower Extremity Assessment Lower Extremity Assessment: Overall WFL for tasks assessed       Communication Communication Communication: No difficulties   Cognition Arousal/Alertness: Awake/alert Behavior During Therapy: WFL for tasks assessed/performed Overall Cognitive Status: Within Functional Limits for tasks assessed                                     General Comments  Husband present and engaged in session.     Exercises     Shoulder Instructions      Home Living Family/patient expects to be discharged to:: Private residence Living Arrangements: Spouse/significant other Available Help at Discharge: Family;Available 24 hours/day Type of Home:  House Home Access: Stairs to enter [employer] of Steps: 3 Entrance Stairs-Rails: None(next to a wall) Home Layout: Two level Alternate Level Stairs-Number of Steps: flight Alternate Level Stairs-Rails: Right Bathroom Shower/Tub: Teacher, early years/pre: Standard     Home Equipment: Shower seat;Adaptive equipment;Hand held shower head;Toilet riser(forearm crutches) Adaptive Equipment: Reacher;Long-handled sponge        Prior Functioning/Environment Level of Independence: Independent        Comments: Works in [place] at [employer].         OT Problem List: Decreased strength;Decreased activity tolerance;Impaired balance (sitting and/or standing);Decreased safety awareness;Decreased  knowledge of use of DME or AE;Decreased knowledge of precautions;Pain      OT Treatment/Interventions:      OT Goals(Current goals can be found in the care plan section) Acute Rehab OT Goals Patient Stated Goal: to go home today OT Goal Formulation: With patient/family Time For Goal Achievement: 10/22/17 Potential to Achieve Goals: Good  OT Frequency:     Barriers to D/C:            Co-evaluation              AM-PAC PT "6 Clicks" Daily Activity     Outcome Measure Help from another person eating meals?: None Help from another person taking care of personal grooming?: None Help from another person toileting, which includes using toliet, bedpan, or urinal?: None Help from another person bathing (including washing, rinsing, drying)?: None Help from another person to put on and taking off regular upper body clothing?: None Help from another person to put on and taking off regular lower body clothing?: None 6 Click Score: 24   End of Session Equipment Utilized During Treatment: Gait belt;Rolling walker Nurse Communication: Mobility status  Activity Tolerance: Patient tolerated treatment well Patient left: in bed;with call bell/phone within reach;with family/visitor present(seated at EOB)  OT Visit Diagnosis: Other abnormalities of gait and mobility (R26.89);Pain Pain - part of body: (back)                Time: 8295-6213 OT Time Calculation (min): 27 min Charges:  OT General Charges $OT Visit: 1 Visit OT Evaluation $OT Eval Low Complexity: 1 Low OT Treatments $Self Care/Home Management : 8-22 mins  Belva Agee, OTR/L Acute Rehabilitation Services Pager [phone] Office [place] A Laquesha Holcomb 10/08/2017, 8:13 AM

## 2017-10-08 NOTE — Progress Notes (Signed)
Patient alert and oriented, mae's well, voiding adequate amount of urine, swallowing without difficulty, no c/o pain at time of discharge. Patient discharged home with family. Script and discharged instructions given to patient. Patient and family stated understanding of instructions given. Patient has an appointment with Dr.Stern    

## 2017-10-08 NOTE — Progress Notes (Addendum)
Subjective: Patient reports "My leg is already stronger and the numbness is gone!"  Objective: Vital signs in last 24 hours: Temp:  [97 F (36.1 C)-98.8 F (37.1 C)] 98.8 F (37.1 C) (09/04 0721) Pulse Rate:  [57-95] 66 (09/04 0721) Resp:  [11-20] 16 (09/04 0721) BP: (105-137)/(46-94) 105/62 (09/04 0721) SpO2:  [98 %-100 %] 100 % (09/04 0721) Weight:  [84.5 kg] 84.5 kg (09/03 1052)  Intake/Output from previous day: 09/03 0701 - 09/04 0700 In: 2793.6 [P.O.:80; I.V.:2348; IV Piggyback:365.6] Out: 400 [Urine:200; Blood:200] Intake/Output this shift: No intake/output data recorded.  Alert, conversant, reporting incisional pain only. Good strength BLE. Incision without erythema, swelling, or drainage beneath Dermabond and honeycomb drsg.   Lab Results: No results for input(s): WBC, HGB, HCT, PLT in the last 72 hours. BMET No results for input(s): NA, K, CL, CO2, GLUCOSE, BUN, CREATININE, CALCIUM in the last 72 hours.  Studies/Results: Dg Lumbar Spine 2-3 Views  Result Date: 10/07/2017 CLINICAL DATA:  L5-S1 PLIF. EXAM: LUMBAR SPINE - 2-3 VIEW; DG C-ARM 61-120 MIN COMPARISON:  09/10/2017 outside lumbar spine radiographs FINDINGS: Fluoroscopy time 0 minutes 50 seconds. 2 nondiagnostic spot fluoroscopic intraoperative lower lumbar spine radiographs demonstrate postsurgical changes from bilateral posterior spinal fusion at L5-S1 with bilateral pedicle screws in place and bone cage in the L5-S1 disc space. IMPRESSION: Intraoperative fluoroscopic guidance for L5-S1 PLIF. Electronically Signed   By: Ilona Sorrel M.D.   On: 10/07/2017 15:08   Dg C-arm 1-60 Min  Result Date: 10/07/2017 CLINICAL DATA:  L5-S1 PLIF. EXAM: LUMBAR SPINE - 2-3 VIEW; DG C-ARM 61-120 MIN COMPARISON:  09/10/2017 outside lumbar spine radiographs FINDINGS: Fluoroscopy time 0 minutes 50 seconds. 2 nondiagnostic spot fluoroscopic intraoperative lower lumbar spine radiographs demonstrate postsurgical changes from bilateral  posterior spinal fusion at L5-S1 with bilateral pedicle screws in place and bone cage in the L5-S1 disc space. IMPRESSION: Intraoperative fluoroscopic guidance for L5-S1 PLIF. Electronically Signed   By: Ilona Sorrel M.D.   On: 10/07/2017 15:08   Dg C-arm 1-60 Min  Result Date: 10/07/2017 CLINICAL DATA:  L5-S1 PLIF. EXAM: LUMBAR SPINE - 2-3 VIEW; DG C-ARM 61-120 MIN COMPARISON:  09/10/2017 outside lumbar spine radiographs FINDINGS: Fluoroscopy time 0 minutes 50 seconds. 2 nondiagnostic spot fluoroscopic intraoperative lower lumbar spine radiographs demonstrate postsurgical changes from bilateral posterior spinal fusion at L5-S1 with bilateral pedicle screws in place and bone cage in the L5-S1 disc space. IMPRESSION: Intraoperative fluoroscopic guidance for L5-S1 PLIF. Electronically Signed   By: Ilona Sorrel M.D.   On: 10/07/2017 15:08   Dg C-arm 1-60 Min  Result Date: 10/07/2017 CLINICAL DATA:  L5-S1 PLIF. EXAM: LUMBAR SPINE - 2-3 VIEW; DG C-ARM 61-120 MIN COMPARISON:  09/10/2017 outside lumbar spine radiographs FINDINGS: Fluoroscopy time 0 minutes 50 seconds. 2 nondiagnostic spot fluoroscopic intraoperative lower lumbar spine radiographs demonstrate postsurgical changes from bilateral posterior spinal fusion at L5-S1 with bilateral pedicle screws in place and bone cage in the L5-S1 disc space. IMPRESSION: Intraoperative fluoroscopic guidance for L5-S1 PLIF. Electronically Signed   By: Ilona Sorrel M.D.   On: 10/07/2017 15:08    Assessment/Plan: Improving  LOS: 1 day  Per Dr. Vertell Limber, d/c to home after cleared by PT. Office f/u in 3-4 weeks with x-ray. Rx's Oxy IR 5mg  and Robaxin 750mg  for prn home use will be sent to pts pharmacy from office.   Verdis Prime 10/08/2017, 7:35 AM  Patient is doing well.  Discharge home.

## 2017-10-08 NOTE — Discharge Summary (Signed)
Physician Discharge Summary  Patient ID: Candice Pratt MRN: [id number] DOB/AGE: [birth year] 52 y.o.  Admit date: 10/07/2017 Discharge date: 10/08/2017  Admission Diagnoses: Spondylolisthesis L 5 S 1 with spondylolysis L 5, radiculopathy, lumbago    Discharge Diagnoses: Spondylolisthesis L 5 S 1 with spondylolysis L 5, radiculopathy, lumbago s/p Lumbar five-Sacral one Posterior Lumbar Interbody Fusion (N/A) Gill procedure, PLIF with PEEK cages, autograft, pedicle screw fixation, posterolateral arthrodesis L 5 S 1 level     Active Problems:   Spondylolysis, lumbar region   Discharged Condition: good  Hospital Course: Candice Pratt was admitted for surgery with dx spondylolisthesis, and radiculopathy. Following uncomplicated B3-A1 PLIF, she recovered nicely and transferred to [facility] for nursing care and therapies. She is mobilizing well.  Consults: None  Significant Diagnostic Studies: radiology: X-Ray: intra-op  Treatments: surgery: Lumbar five-Sacral one Posterior Lumbar Interbody Fusion (N/A) Gill procedure, PLIF with PEEK cages, autograft, pedicle screw fixation, posterolateral arthrodesis L 5 S 1 level    Discharge Exam: Blood pressure 105/62, pulse 66, temperature 98.8 F (37.1 C), temperature source Oral, resp. rate 16, height 5\' 10"  (1.778 m), weight 84.5 kg, last menstrual period 09/23/2017, SpO2 100 %. Alert, conversant, reporting incisional pain only. Good strength BLE. Incision without erythema, swelling, or drainage beneath Dermabond and honeycomb drsg.    Disposition: Discharge disposition: 01-Home or Self Care Discharge to home. Office f/u in 3-4 weeks with x-ray. Rx's Oxy IR 5mg  and Robaxin 750mg  for prn home use will be sent to pts pharmacy from office.      Allergies as of 10/08/2017      Reactions   Beeswax Swelling   When applied topically   Chlorhexidine Gluconate Itching      Medication List    STOP taking these medications    naproxen sodium 220 MG tablet Commonly known as:  ALEVE     TAKE these medications   BLINK TEARS OP Place 1 drop into both eyes daily as needed (dry eyes).   cyclobenzaprine 10 MG tablet Commonly known as:  FLEXERIL Take 0.5-1 tablets (5-10 mg total) by mouth 3 (three) times daily as needed.   metaxalone 800 MG tablet Commonly known as:  SKELAXIN Take 400-800 mg by mouth 3 (three) times daily as needed for muscle spasms.   methocarbamol 750 MG tablet Commonly known as:  ROBAXIN Take 1 tablet (750 mg total) by mouth at bedtime as needed for muscle spasms. What changed:  Another medication with the same name was added. Make sure you understand how and when to take each.   methocarbamol 750 MG tablet Commonly known as:  ROBAXIN Take 1 tablet (750 mg total) by mouth every 6 (six) hours as needed for muscle spasms. What changed:  You were already taking a medication with the same name, and this prescription was added. Make sure you understand how and when to take each.   MIRENA (52 MG) 20 MCG/24HR IUD Generic drug:  levonorgestrel 1 each by Intrauterine route once.   omeprazole 20 MG tablet Commonly known as:  PRILOSEC OTC Take 20 mg by mouth daily.   oxyCODONE 5 MG immediate release tablet Commonly known as:  Oxy IR/ROXICODONE Take 1-2 tablets (5-10 mg total) by mouth every 4 (four) hours as needed for moderate pain or severe pain ((score 7 to 10)).   traMADol 50 MG tablet Commonly known as:  ULTRAM Take 50 mg by mouth at bedtime as needed for pain.        Signed: Peggyann Shoals, MD  10/08/2017, 8:04 AM

## 2017-10-08 NOTE — Discharge Instructions (Signed)

## 2017-10-08 NOTE — Progress Notes (Signed)
Physical Therapy Evaluation Patient Details Name: Candice Pratt MRN: [id number] DOB: August 12, 1965 Today's Date: 10/08/2017   History of Present Illness  Pt is a 52 y.o. female who is s/p L5-S1 PLIF. She has no pertinent PMH on file.   Clinical Impression  Patient is s/p above surgery resulting in deficits listed below (see PT Problem List). At the time of the evaluation pt was gross min G to supervision for ambulation with use of SPC for safety. Pt and spouse educated on generalized walking program, brace adjustments, and positioning. Pt reports increase in strength and improvements in bil LE symptoms, but still demonstrating deficits. Recommending SPC at d/c to maximize safety with mobility. Will continue to follow while admitted to increase their independence and safety with mobility to allow discharge to the venue listed below.      Follow Up Recommendations No PT follow up;Supervision for mobility/OOB    Equipment Recommendations  None recommended by PT    Recommendations for Other Services       Precautions / Restrictions Precautions Precautions: Back Precaution Booklet Issued: Yes (comment) Precaution Comments: Reviewed preautions in full with pt and husband. She recalled 3/3 at end of session. Required Braces or Orthoses: Spinal Brace Spinal Brace: Lumbar corset;Applied in sitting position(adjusted in standing) Restrictions Weight Bearing Restrictions: No      Mobility  Bed Mobility Overal bed mobility: Modified Independent             General bed mobility comments: seated EOB upon entry. Verbally reviewed log roll sequencing.  Transfers Overall transfer level: Modified independent Equipment used: None             General transfer comment: Pt demonstrated good technique and hand placement. Increased time and effort noted secondary to pain. Bed elevated upon arrival simulating height of bed at d/c  Ambulation/Gait Ambulation/Gait assistance: Min  guard;Supervision Gait Distance (Feet): 350 Feet Assistive device: Straight cane Gait Pattern/deviations: Step-through pattern;Drifts right/left Gait velocity: decreased Gait velocity interpretation: <1.31 ft/sec, indicative of household ambulator General Gait Details: Pt initially slow and guarded. Min guard initially secondary to reports of being dizzy upon standing progressing to supervision for safety as dizziness ceased. Min cues for upright posture and eyes forward. Pt improved stability with SPC demonstrating less reaching for furniture and walls. Pt required VCs for sequencing cane with ambulation.  Stairs Stairs: Yes Stairs assistance: Min assist;Min guard Stair Management: No rails;One rail Right;Forwards;With cane;Step to pattern Number of Stairs: 10 General stair comments: Pt initially ascended/descended 3 stairs forwards with use of HHA+1 and sliding hand along rail, but not actually gripping it. Pt sliding hand along rail to simulate how she would enter house at d/c as pt able to slide hand along side of house for stability, but unable to fully grip onto it. In addition to 3 stairs pt also negotiated a full flight of stairs with use of rail on the right and SPC on the left with min cues for sequencing. Pt reports bil LE are of similiar strength enabling her to ascend/descend with either LE.   Wheelchair Mobility    Modified Rankin (Stroke Patients Only)       Balance Overall balance assessment: Needs assistance Sitting-balance support: No upper extremity supported;Feet supported Sitting balance-Leahy Scale: Good     Standing balance support: Single extremity supported;No upper extremity supported;During functional activity Standing balance-Leahy Scale: Fair Standing balance comment: Pt able to stand statically without UE support. Pt requires at least 1 UE support for dynamic standing activities.  Pertinent Vitals/Pain Pain  Assessment: 0-10 Pain Score: 8  Faces Pain Scale: Hurts a little bit Pain Location: back at incision Pain Descriptors / Indicators: Discomfort;Grimacing;Operative site guarding;Pressure Pain Intervention(s): Limited activity within patient's tolerance;Monitored during session;Repositioned;Ice applied    Home Living Family/patient expects to be discharged to:: Private residence Living Arrangements: Spouse/significant other Available Help at Discharge: Family;Available 24 hours/day Type of Home: House Home Access: Stairs to enter Entrance Stairs-Rails: None Entrance Stairs-Number of Steps: 3 Home Layout: Two level Home Equipment: Shower seat;Adaptive equipment;Hand held shower head;Toilet riser Additional Comments: No rails at entrance but utilizes house to stabilize on right side    Prior Function Level of Independence: Independent         Comments: Works in [place] at [employer].      Hand Dominance        Extremity/Trunk Assessment   Upper Extremity Assessment Upper Extremity Assessment: Defer to OT evaluation    Lower Extremity Assessment Lower Extremity Assessment: Overall WFL for tasks assessed    Cervical / Trunk Assessment Cervical / Trunk Assessment: Other exceptions Cervical / Trunk Exceptions: s/p spinal surgery  Communication   Communication: No difficulties  Cognition Arousal/Alertness: Awake/alert Behavior During Therapy: WFL for tasks assessed/performed Overall Cognitive Status: Within Functional Limits for tasks assessed                                        General Comments General comments (skin integrity, edema, etc.): Husband present and engaged in session.    Exercises     Assessment/Plan    PT Assessment Patient needs continued PT services  PT Problem List Decreased strength;Decreased range of motion;Decreased activity tolerance;Decreased balance;Decreased mobility;Decreased coordination;Decreased knowledge of use of  DME;Decreased safety awareness;Pain       PT Treatment Interventions DME instruction;Gait training;Stair training;Functional mobility training;Therapeutic activities;Balance training;Patient/family education;Modalities    PT Goals (Current goals can be found in the Care Plan section)  Acute Rehab PT Goals Patient Stated Goal: to go home today PT Goal Formulation: With patient Time For Goal Achievement: 10/15/17 Potential to Achieve Goals: Good    Frequency Min 5X/week   Barriers to discharge        Co-evaluation               AM-PAC PT "6 Clicks" Daily Activity  Outcome Measure Difficulty turning over in bed (including adjusting bedclothes, sheets and blankets)?: A Little Difficulty moving from lying on back to sitting on the side of the bed? : A Little Difficulty sitting down on and standing up from a chair with arms (e.g., wheelchair, bedside commode, etc,.)?: A Little Help needed moving to and from a bed to chair (including a wheelchair)?: A Little Help needed walking in hospital room?: A Little Help needed climbing 3-5 steps with a railing? : A Little 6 Click Score: 18    End of Session Equipment Utilized During Treatment: Gait belt;Back brace Activity Tolerance: Patient tolerated treatment well Patient left: in chair;with call bell/phone within reach;with family/visitor present Nurse Communication: Mobility status PT Visit Diagnosis: Unsteadiness on feet (R26.81);Other abnormalities of gait and mobility (R26.89);Pain Pain - part of body: (back )    Time: 6045-4098 PT Time Calculation (min) (ACUTE ONLY): 34 min   Charges:  1 Mod evaluation                         1  gait training: 8-22 mins            Einar Crow, [place]  Student Physical Therapist Acute Rehab [phone]   Einar Crow 10/08/2017, 10:19 AM

## 2017-10-09 ENCOUNTER — Other Ambulatory Visit: Payer: Self-pay | Admitting: *Deleted

## 2017-10-09 ENCOUNTER — Encounter: Payer: Self-pay | Admitting: *Deleted

## 2017-10-09 NOTE — Patient Outreach (Addendum)
[facility]) Care Management  10/09/2017  KELEE CUNNINGHAM Jan 01, 1966 [id number]   Subjective: Telephone call from patient's home / mobile number, spoke with patient, and HIPAA verified.  Discussed [facility] Care Management [facility]MR Transition of care follow up, patient voiced understanding, and is in agreement to follow up.  Patient states she started out having a bad day today (3rd post operative day), overslept did not take pain medication as scheduled, had some breakthrough pain, has gotten back on track with medication regimen, and starting to feel some better.  States the surgery went great, has gained more control of her legs, increased strength in legs, and noticed immediate improvement in  her overall mobility.   States she has a follow up appointment with surgeon on 11/03/17.   Patient states she is able to manage self care and has assistance as needed with activities of daily living / home management.  Patient voices understanding of medical diagnosis, surgery, and treatment plan.  [facility] benefits discussed on 09/26/17 preoperative call and patient states no additional questions at this time.  States she is so appreciative to this RNCM about reminding her to verify that she has the hospital indemnity plan.  States she will follow up with [facility] regarding a  0 % interest payment plan if needed.  Patient states she does not have any education material, transition of care, care coordination, disease management, disease monitoring, transportation, community resource, or pharmacy needs at this time. States she is very appreciative of the follow up and is in agreement to receive [place] Management information.     Objective:Per KPN (Knowledge Performance Now, point of care tool) and chart review,patient hospitalized  10/07/17  -10/08/17 for Spondylolisthesis L 5 S 1 with spondylolysis L 5, radiculopathy, lumbago, status post Lumbar five-Sacral one Posterior Lumbar Interbody  Fusion, Gill procedure ( PLIF) with cages, autograft, pedicle screw fixation, posterolateral arthrodesis L 5 S 1 level. Patient has a history of Lymphadenopathy, inguinal, asthma,Lateral epicondylitis of right elbow, tobacco abuse, and hiatal hernia.      Assessment: Received [facility] Preoperative / Transition of care referral on 09/25/17. Preoperative call completed, and Transition of care follow up completed, no care management needs, and will proceed with case closure.      Plan:RNCM will send patient successful outreach letter, [facility] pamphlet, and magnet. RNCM will complete case closure due to follow up completed / no care management needs.      Jasreet Dickie H. Annia Friendly, BSN, [place] Management [facility] Telephonic CM Phone: [phone] Fax: [phone]

## 2017-10-09 NOTE — Patient Outreach (Signed)
[facility]) Care Management  10/09/2017  AIMEE HELDMAN 1965/05/07 [id number]   Subjective: Telephone call to patient's home / mobile number, spoke with patient, states she remembers speaking with this RNCM in past, is currently in the middle of something, unable to talk, and will call back at a later time.     Objective:Per KPN (Knowledge Performance Now, point of care tool) and chart review,patient hospitalized  10/07/17  -10/08/17 for Spondylolisthesis L 5 S 1 with spondylolysis L 5, radiculopathy, lumbago, status post Lumbar five-Sacral one Posterior Lumbar Interbody Fusion, Gill procedure ( PLIF) with cages, autograft, pedicle screw fixation, posterolateral arthrodesis L 5 S 1 level. Patient has a history of Lymphadenopathy, inguinal, asthma,Lateral epicondylitis of right elbow, tobacco abuse, and hiatal hernia.      Assessment: Received [facility]MR Preoperative / Transition of care referral on 09/25/17. Preoperative call completed, and Transition of care follow up pending patient contact.      Plan:RNCM will send unsuccessful outreach  letter, [facility] pamphlet, will call patient for 2nd telephone outreach attempt, transition of care follow up, and proceed with case closure, within 10 business days if no return call.      Debanhi Blaker H. Annia Friendly, BSN, [place] Management [facility] Telephonic CM Phone: [phone] Fax: [phone]

## 2017-10-10 ENCOUNTER — Ambulatory Visit: Payer: Self-pay | Admitting: *Deleted

## 2017-11-03 DIAGNOSIS — M5416 Radiculopathy, lumbar region: Secondary | ICD-10-CM | POA: Diagnosis not present

## 2017-11-17 DIAGNOSIS — H5213 Myopia, bilateral: Secondary | ICD-10-CM | POA: Diagnosis not present

## 2017-12-03 ENCOUNTER — Encounter: Payer: Self-pay | Admitting: Emergency Medicine

## 2017-12-10 DIAGNOSIS — M4300 Spondylolysis, site unspecified: Secondary | ICD-10-CM | POA: Diagnosis not present

## 2017-12-10 DIAGNOSIS — Z6827 Body mass index (BMI) 27.0-27.9, adult: Secondary | ICD-10-CM | POA: Diagnosis not present

## 2017-12-10 DIAGNOSIS — M5416 Radiculopathy, lumbar region: Secondary | ICD-10-CM | POA: Diagnosis not present

## 2017-12-10 DIAGNOSIS — M4317 Spondylolisthesis, lumbosacral region: Secondary | ICD-10-CM | POA: Diagnosis not present

## 2017-12-10 DIAGNOSIS — R030 Elevated blood-pressure reading, without diagnosis of hypertension: Secondary | ICD-10-CM | POA: Diagnosis not present

## 2017-12-10 DIAGNOSIS — M545 Low back pain: Secondary | ICD-10-CM | POA: Diagnosis not present

## 2018-01-20 MED FILL — METAXALONE 800 MG TABLET: 800 | 20 days supply | Qty: 60 | Fill #0

## 2018-02-11 DIAGNOSIS — M4317 Spondylolisthesis, lumbosacral region: Secondary | ICD-10-CM | POA: Diagnosis not present

## 2018-02-11 DIAGNOSIS — M5126 Other intervertebral disc displacement, lumbar region: Secondary | ICD-10-CM | POA: Diagnosis not present

## 2018-02-11 DIAGNOSIS — R030 Elevated blood-pressure reading, without diagnosis of hypertension: Secondary | ICD-10-CM | POA: Diagnosis not present

## 2018-02-11 DIAGNOSIS — Z6827 Body mass index (BMI) 27.0-27.9, adult: Secondary | ICD-10-CM | POA: Diagnosis not present

## 2018-02-11 DIAGNOSIS — M545 Low back pain: Secondary | ICD-10-CM | POA: Diagnosis not present

## 2018-02-11 DIAGNOSIS — M5416 Radiculopathy, lumbar region: Secondary | ICD-10-CM | POA: Diagnosis not present

## 2018-02-11 DIAGNOSIS — M4300 Spondylolysis, site unspecified: Secondary | ICD-10-CM | POA: Diagnosis not present

## 2018-03-16 DIAGNOSIS — M5126 Other intervertebral disc displacement, lumbar region: Secondary | ICD-10-CM | POA: Diagnosis not present

## 2018-03-16 DIAGNOSIS — Z6827 Body mass index (BMI) 27.0-27.9, adult: Secondary | ICD-10-CM | POA: Diagnosis not present

## 2018-03-16 DIAGNOSIS — M5416 Radiculopathy, lumbar region: Secondary | ICD-10-CM | POA: Diagnosis not present

## 2018-03-16 DIAGNOSIS — M4300 Spondylolysis, site unspecified: Secondary | ICD-10-CM | POA: Diagnosis not present

## 2018-03-16 DIAGNOSIS — M545 Low back pain: Secondary | ICD-10-CM | POA: Diagnosis not present

## 2018-03-16 DIAGNOSIS — M4317 Spondylolisthesis, lumbosacral region: Secondary | ICD-10-CM | POA: Diagnosis not present

## 2018-03-24 DIAGNOSIS — M545 Low back pain: Secondary | ICD-10-CM | POA: Diagnosis not present

## 2018-03-27 MED FILL — traMADol HCL 50 MG TABS: 50 | 15 days supply | Qty: 60 | Fill #0

## 2018-04-02 DIAGNOSIS — M545 Low back pain: Secondary | ICD-10-CM | POA: Diagnosis not present

## 2018-04-09 DIAGNOSIS — M545 Low back pain: Secondary | ICD-10-CM | POA: Diagnosis not present

## 2018-04-23 DIAGNOSIS — M545 Low back pain: Secondary | ICD-10-CM | POA: Diagnosis not present

## 2018-05-27 ENCOUNTER — Other Ambulatory Visit: Payer: Self-pay

## 2018-05-27 ENCOUNTER — Telehealth (INDEPENDENT_AMBULATORY_CARE_PROVIDER_SITE_OTHER): Payer: 59 | Admitting: Family Medicine

## 2018-05-27 VITALS — BP 118/78 | HR 98 | Temp 98.7°F | Resp 16 | Ht 70.0 in | Wt 190.0 lb

## 2018-05-27 DIAGNOSIS — R0789 Other chest pain: Secondary | ICD-10-CM | POA: Diagnosis not present

## 2018-05-27 DIAGNOSIS — K2100 Gastro-esophageal reflux disease with esophagitis, without bleeding: Secondary | ICD-10-CM

## 2018-05-27 DIAGNOSIS — M25512 Pain in left shoulder: Secondary | ICD-10-CM

## 2018-05-27 DIAGNOSIS — F17200 Nicotine dependence, unspecified, uncomplicated: Secondary | ICD-10-CM

## 2018-05-27 MED ORDER — SUCRALFATE 1 G PO TABS: 1.0000 g | ORAL_TABLET | Freq: Three times a day (TID) | ORAL | 0 refills | Status: DC

## 2018-05-27 MED ORDER — OMEPRAZOLE 20 MG PO CPDR: 20.0000 mg | DELAYED_RELEASE_CAPSULE | Freq: Every day | ORAL | 1 refills | Status: DC

## 2018-05-27 NOTE — Progress Notes (Signed)
CC- chest pain. Tightness in chest Took antacid and it got better but then came back. Had some left side of neck pain and should blade. Been going on since Sunday.

## 2018-05-27 NOTE — Patient Instructions (Addendum)
EKG is reassuring today.  Symptoms may be due to heartburn, esophagitis, or possible esophageal spasm. Try restarting omeprazole once per day, Carafate with meals and at bedtime, and see list of foods to avoid below that can worsen heartburn. I would like you to follow-up with Dr. Hilarie Fredrickson, I will place a referral. If pains worsen, be seen in the emergency room for further testing. Send me my chart message in the next 3 to 4 days let me know how you are doing. Return to the clinic or go to the nearest emergency room if any of your symptoms worsen or new symptoms occur.   Food Choices for Gastroesophageal Reflux Disease, Adult When you have gastroesophageal reflux disease (GERD), the foods you eat and your eating habits are very important. Choosing the right foods can help ease your discomfort. Think about working with a nutrition specialist (dietitian) to help you make good choices. What are tips for following this plan?  Meals  Choose healthy foods that are low in fat, such as fruits, vegetables, whole grains, low-fat dairy products, and lean meat, fish, and poultry.  Eat small meals often instead of 3 large meals a day. Eat your meals slowly, and in a place where you are relaxed. Avoid bending over or lying down until 2-3 hours after eating.  Avoid eating meals 2-3 hours before bed.  Avoid drinking a lot of liquid with meals.  Cook foods using methods other than frying. Bake, grill, or broil food instead.  Avoid or limit: ? Chocolate. ? Peppermint or spearmint. ? Alcohol. ? Pepper. ? Black and decaffeinated coffee. ? Black and decaffeinated tea. ? Bubbly (carbonated) soft drinks. ? Caffeinated energy drinks and soft drinks.  Limit high-fat foods such as: ? Fatty meat or fried foods. ? Whole milk, cream, butter, or ice cream. ? Nuts and nut butters. ? Pastries, donuts, and sweets made with butter or shortening.  Avoid foods that cause symptoms. These foods may be different for  everyone. Common foods that cause symptoms include: ? Tomatoes. ? Oranges, lemons, and limes. ? Peppers. ? Spicy food. ? Onions and garlic. ? Vinegar. Lifestyle  Maintain a healthy weight. Ask your doctor what weight is healthy for you. If you need to lose weight, work with your doctor to do so safely.  Exercise for at least 30 minutes for 5 or more days each week, or as told by your doctor.  Wear loose-fitting clothes.  Do not smoke. If you need help quitting, ask your doctor.  Sleep with the head of your bed higher than your feet. Use a wedge under the mattress or blocks under the bed frame to raise the head of the bed. Summary  When you have gastroesophageal reflux disease (GERD), food and lifestyle choices are very important in easing your symptoms.  Eat small meals often instead of 3 large meals a day. Eat your meals slowly, and in a place where you are relaxed.  Limit high-fat foods such as fatty meat or fried foods.  Avoid bending over or lying down until 2-3 hours after eating.  Avoid peppermint and spearmint, caffeine, alcohol, and chocolate. This information is not intended to replace advice given to you by your health care provider. Make sure you discuss any questions you have with your health care provider. Document Released: 07/23/2011 Document Revised: 02/27/2016 Document Reviewed: 02/27/2016 Elsevier Interactive Patient Education  2019 [employer]   Nonspecific Chest Pain Chest pain can be caused by many different conditions. Some causes of chest  pain can be life-threatening. These will require treatment right away. Serious causes of chest pain include:  Heart attack.  A tear in the body's main blood vessel.  Redness and swelling (inflammation) around your heart.  Blood clot in your lungs. Other causes of chest pain may not be so serious. These include:  Heartburn.  Anxiety or stress.  Damage to bones or muscles in your chest.  Lung  infections. Chest pain can feel like:  Pain or discomfort in your chest.  Crushing, pressure, aching, or squeezing pain.  Burning or tingling.  Dull or sharp pain that is worse when you move, cough, or take a deep breath.  Pain or discomfort that is also felt in your back, neck, jaw, shoulder, or arm, or pain that spreads to any of these areas. It is hard to know whether your pain is caused by something that is serious or something that is not so serious. So it is important to see your doctor right away if you have chest pain. Follow these instructions at home: Medicines  Take over-the-counter and prescription medicines only as told by your doctor.  If you were prescribed an antibiotic medicine, take it as told by your doctor. Do not stop taking the antibiotic even if you start to feel better. Lifestyle   Rest as told by your doctor.  Do not use any products that contain nicotine or tobacco, such as cigarettes, e-cigarettes, and chewing tobacco. If you need help quitting, ask your doctor.  Do not drink alcohol.  Make lifestyle changes as told by your doctor. These may include: ? Getting regular exercise. Ask your doctor what activities are safe for you. ? Eating a heart-healthy diet. A diet and nutrition specialist (dietitian) can help you to learn healthy eating options. ? Staying at a healthy weight. ? Treating diabetes or high blood pressure, if needed. ? Lowering your stress. Activities such as yoga and relaxation techniques can help. General instructions  Pay attention to any changes in your symptoms. Tell your doctor about them or any new symptoms.  Avoid any activities that cause chest pain.  Keep all follow-up visits as told by your doctor. This is important. You may need more testing if your chest pain does not go away. Contact a doctor if:  Your chest pain does not go away.  You feel depressed.  You have a fever. Get help right away if:  Your chest pain is  worse.  You have a cough that gets worse, or you cough up blood.  You have very bad (severe) pain in your belly (abdomen).  You pass out (faint).  You have either of these for no clear reason: ? Sudden chest discomfort. ? Sudden discomfort in your arms, back, neck, or jaw.  You have shortness of breath at any time.  You suddenly start to sweat, or your skin gets clammy.  You feel sick to your stomach (nauseous).  You throw up (vomit).  You suddenly feel lightheaded or dizzy.  You feel very weak or tired.  Your heart starts to beat fast, or it feels like it is skipping beats. These symptoms may be an emergency. Do not wait to see if the symptoms will go away. Get medical help right away. Call your local emergency services (911 in the [place] Do not drive yourself to the hospital. Summary  Chest pain can be caused by many different conditions. The cause may be serious and need treatment right away. If you have chest pain, see  your doctor right away.  Follow your doctor's instructions for taking medicines and making lifestyle changes.  Keep all follow-up visits as told by your doctor. This includes visits for any further testing if your chest pain does not go away.  Be sure to know the signs that show that your condition has become worse. Get help right away if you have these symptoms. This information is not intended to replace advice given to you by your health care provider. Make sure you discuss any questions you have with your health care provider. Document Released: 07/10/2007 Document Revised: 07/24/2017 Document Reviewed: 07/24/2017 Elsevier Interactive Patient Education  [employer].     If you have lab work done today you will be contacted with your lab results within the next 2 weeks.  If you have not heard from [place] then please contact us. The fastest way to get your results is to register for My Chart.   IF you received an x-ray today, you will receive an  invoice from [facility]. Please contact [facility] at [phone] with questions or concerns regarding your invoice.   IF you received labwork today, you will receive an invoice from [facility]. Please contact [facility] at [phone] with questions or concerns regarding your invoice.   Our billing staff will not be able to assist you with questions regarding bills from these companies.  You will be contacted with the lab results as soon as they are available. The fastest way to get your results is to activate your My Chart account. Instructions are located on the last page of this paperwork. If you have not heard from [place] regarding the results in 2 weeks, please contact this office.

## 2018-05-27 NOTE — Progress Notes (Signed)
Subjective:  Patient ID: Candice Pratt, female    DOB: [birth year]  Age: 53 y.o. MRN: [id number]  CC:  Chest pain, tightness.   HPI Candice Pratt presents for chest pain/tightness.  Initial plan for telemedicine visit, but due to concerns was brought in for in person visit.  Has noted tightness in the chest/chest pain.  Started 3 days ago at home. Noticed at rest. No food yet that day, had coffee that morning. Typical amount. History of reflux (feels choking or burning in throat), this felt more like heartburn with pressure feeling from diaphragm/xiphoid to upper chest. No belching/burping initially.no diaphoresis, nausea or dyspnea initially.  No initial pain into arm neck or shoulder, then after 1.5hrs noticed soreness into left shoulder blade and neck.  Past few years similar feeling a few times that was relieved with gaviscon.  Endoscopy in 2015 GI: Pyrtle. Mild esophagitis and gastritis.   No recent prolonged car travel or air travel, no recent calf pain or swelling. No recent surgery.   HG:DJME x 4 - no relief.  Cereal after 1 hr.  No relief.  Gaviscon - 2, caused vomiting of gaviscon and cereal. Felt better for 10 mins, then returned.   Intermittent sx's past few days - at rest. Lasts about 15 min twice per day past few days.  No further neck or shoulder pain.  That was only during first symptoms 3 days ago.  Not related to eating, nonexertional.  Low exertion activity, but no change in sx's. No dyspnea.  No family history of early cardiac disease, no prior history of heart disease.  Smokes 1/2 pack/day.  Treated by Dr. Hilarie Fredrickson in the past for heartburn.  Had tried omeprazole or 2 omeprazole per day with minimal improvement so stopped taking medications.   History Patient Active Problem List   Diagnosis Date Noted  . Spondylolysis, lumbar region 10/07/2017  . Lymphadenopathy, inguinal 08/13/2017  . Lateral epicondylitis of right elbow 01/04/2017  . DERMATITIS  HERPETIFORMIS 05/08/2009  . TOBACCO ABUSE 03/31/2009  . HEMORRHOIDS, EXTERNAL 03/31/2009  . FATIGUE 03/31/2009   Past Medical History:  Diagnosis Date  . Asthma   . Bronchitis, chronic ([place])   . GERD (gastroesophageal reflux disease)   . Hiatal hernia   . Spondylolisthesis of lumbar region   . Stress incontinence   . Wears contact lenses    Past Surgical History:  Procedure Laterality Date  . ESOPHAGOGASTRODUODENOSCOPY    . KNEE SURGERY     Allergies  Allergen Reactions  . Beeswax Swelling    When applied topically  . Chlorhexidine Gluconate Itching   Prior to Admission medications   Medication Sig Start Date End Date Taking? Authorizing Provider  levonorgestrel (MIRENA) 20 MCG/24HR IUD 1 each by Intrauterine route once.   Yes [provider]  methocarbamol (ROBAXIN-750) 750 MG tablet Take 1 tablet (750 mg total) by mouth at bedtime as needed for muscle spasms. 03/19/17  Yes Wendie Agreste, MD  Polyethylene Glycol 400 (BLINK TEARS OP) Place 1 drop into both eyes daily as needed (dry eyes).   Yes [provider]  traMADol (ULTRAM) 50 MG tablet Take 50 mg by mouth at bedtime as needed for pain. 09/10/17  Yes [provider]  acetaminophen (TYLENOL) 500 MG tablet Take 1,000 mg by mouth every 4 (four) hours as needed.    [provider]  methocarbamol (ROBAXIN-750) 750 MG tablet Take 1 tablet (750 mg total) by mouth every 6 (six) hours as needed  for muscle spasms. Patient not taking: Reported on 10/09/2017 10/08/17   Erline Levine, MD   Social History   Socioeconomic History  . Marital status: Married    Spouse name: Not on file  . Number of children: 3  . Years of education: Not on file  . Highest education level: Not on file  Occupational History    Employer: [employer]  . Financial resource strain: Not on file  . Food insecurity:    Worry: Not on file    Inability: Not on file  . Transportation needs:    Medical: Not on  file    Non-medical: Not on file  Tobacco Use  . Smoking status: Current Every Day Smoker    Packs/day: 0.50    Years: 20.00    Pack years: 10.00    Types: Cigarettes  . Smokeless tobacco: Never Used  Substance and Sexual Activity  . Alcohol use: Yes    Alcohol/week: 1.0 standard drinks    Types: 1 Glasses of wine per week  . Drug use: No  . Sexual activity: Yes    Birth control/protection: I.U.D.  Lifestyle  . Physical activity:    Days per week: Not on file    Minutes per session: Not on file  . Stress: Not on file  Relationships  . Social connections:    Talks on phone: Not on file    Gets together: Not on file    Attends religious service: Not on file    Active member of club or organization: Not on file    Attends meetings of clubs or organizations: Not on file    Relationship status: Not on file  . Intimate partner violence:    Fear of current or ex partner: Not on file    Emotionally abused: Not on file    Physically abused: Not on file    Forced sexual activity: Not on file  Other Topics Concern  . Not on file  Social History Narrative  . Not on file    Review of Systems Per HPI Objective:  BP 118/78 (BP Location: Right Arm, Patient Position: Sitting, Cuff Size: Normal)   Pulse 98   Temp 98.7 F (37.1 C) (Oral)   Resp 16   Ht 5\' 10"  (1.778 m)   Wt 190 lb (86.2 kg)   SpO2 97%   BMI 27.26 kg/m   Physical Exam Vitals:   05/27/18 1604  BP: 118/78  Pulse: 98  Resp: 16  Temp: 98.7 F (37.1 C)  TempSrc: Oral  SpO2: 97%  Weight: 190 lb (86.2 kg)  Height: 5\' 10"  (1.778 m)    Gen: alert, no distress, nontoxic appearance.  Lungs clear, no wheezes rales or rhonchi Heart, regular rate and rhythm no murmurs, rubs, gallops.  No JVD Chest wall, nontender, unable to reproduce pain. Abdomen soft, nontender, nondistended.  Extremities no edema.  Musculoskeletal, shoulder/cervical spine nontender, range of motion without difficulty. Neuro: nonfocal  Skin: no rash.  Psych: Euthymic mood with affect mood congruent.  Normal responses.  EKG sinus rhythm, rate 87, QTc 447, no apparent ST or T wave changes.  Assessment & Plan:  Candice Pratt is a 53 y.o. female . Other chest pain - Plan: EKG 12-Lead, Ambulatory referral to Gastroenterology  Gastroesophageal reflux disease with esophagitis - Plan: omeprazole (PRILOSEC) 20 MG capsule, sucralfate (CARAFATE) 1 g tablet, Ambulatory referral to Gastroenterology  Tobacco use disorder  Pain in joint of left shoulder No orders of  the defined types were placed in this encounter.  Chest pain above that started 3 days ago, nonexertional, atypical.  Has had a history of GERD/esophagitis in the past.  Similar symptoms in the past that have resolved with Gaviscon.  Initial symptoms associated with neck and shoulder discomfort but that has resolved.  Now with intermittent symptoms few times per day that resolved after 15 minutes, again nonexertional.  Only known cardiac risk factor would be tobacco use.  No acute findings on EKG.  Chest pain-free in the office.  No known pulmonary embolus/DVT risk factors. Differential includes recurrence of esophagitis/GERD/esophageal spasm.  -Trial of omeprazole, Carafate, avoidance of GERD triggers, handout given.  -Referral placed to gastroenterology for follow-up  -If any worsening symptoms or radiating pain, associated nausea/diaphoresis or other worsening, recommended emergency room evaluation/911 precautions.  -Update by MyChart next few days.   Patient Instructions   EKG is reassuring today.  Symptoms may be due to heartburn, esophagitis, or possible esophageal spasm. Try restarting omeprazole once per day, Carafate with meals and at bedtime, and see list of foods to avoid below that can worsen heartburn. I would like you to follow-up with Dr. Hilarie Fredrickson, I will place a referral. If pains worsen, be seen in the emergency room for further testing. Send me my  chart message in the next 3 to 4 days let me know how you are doing. Return to the clinic or go to the nearest emergency room if any of your symptoms worsen or new symptoms occur.   Food Choices for Gastroesophageal Reflux Disease, Adult When you have gastroesophageal reflux disease (GERD), the foods you eat and your eating habits are very important. Choosing the right foods can help ease your discomfort. Think about working with a nutrition specialist (dietitian) to help you make good choices. What are tips for following this plan?  Meals  Choose healthy foods that are low in fat, such as fruits, vegetables, whole grains, low-fat dairy products, and lean meat, fish, and poultry.  Eat small meals often instead of 3 large meals a day. Eat your meals slowly, and in a place where you are relaxed. Avoid bending over or lying down until 2-3 hours after eating.  Avoid eating meals 2-3 hours before bed.  Avoid drinking a lot of liquid with meals.  Cook foods using methods other than frying. Bake, grill, or broil food instead.  Avoid or limit: ? Chocolate. ? Peppermint or spearmint. ? Alcohol. ? Pepper. ? Black and decaffeinated coffee. ? Black and decaffeinated tea. ? Bubbly (carbonated) soft drinks. ? Caffeinated energy drinks and soft drinks.  Limit high-fat foods such as: ? Fatty meat or fried foods. ? Whole milk, cream, butter, or ice cream. ? Nuts and nut butters. ? Pastries, donuts, and sweets made with butter or shortening.  Avoid foods that cause symptoms. These foods may be different for everyone. Common foods that cause symptoms include: ? Tomatoes. ? Oranges, lemons, and limes. ? Peppers. ? Spicy food. ? Onions and garlic. ? Vinegar. Lifestyle  Maintain a healthy weight. Ask your doctor what weight is healthy for you. If you need to lose weight, work with your doctor to do so safely.  Exercise for at least 30 minutes for 5 or more days each week, or as told by your  doctor.  Wear loose-fitting clothes.  Do not smoke. If you need help quitting, ask your doctor.  Sleep with the head of your bed higher than your feet. Use a wedge under the  mattress or blocks under the bed frame to raise the head of the bed. Summary  When you have gastroesophageal reflux disease (GERD), food and lifestyle choices are very important in easing your symptoms.  Eat small meals often instead of 3 large meals a day. Eat your meals slowly, and in a place where you are relaxed.  Limit high-fat foods such as fatty meat or fried foods.  Avoid bending over or lying down until 2-3 hours after eating.  Avoid peppermint and spearmint, caffeine, alcohol, and chocolate. This information is not intended to replace advice given to you by your health care provider. Make sure you discuss any questions you have with your health care provider. Document Released: 07/23/2011 Document Revised: 02/27/2016 Document Reviewed: 02/27/2016 Elsevier Interactive Patient Education  2019 Elsevier Inc.   Nonspecific Chest Pain Chest pain can be caused by many different conditions. Some causes of chest pain can be life-threatening. These will require treatment right away. Serious causes of chest pain include:  Heart attack.  A tear in the body's main blood vessel.  Redness and swelling (inflammation) around your heart.  Blood clot in your lungs. Other causes of chest pain may not be so serious. These include:  Heartburn.  Anxiety or stress.  Damage to bones or muscles in your chest.  Lung infections. Chest pain can feel like:  Pain or discomfort in your chest.  Crushing, pressure, aching, or squeezing pain.  Burning or tingling.  Dull or sharp pain that is worse when you move, cough, or take a deep breath.  Pain or discomfort that is also felt in your back, neck, jaw, shoulder, or arm, or pain that spreads to any of these areas. It is hard to know whether your pain is caused by  something that is serious or something that is not so serious. So it is important to see your doctor right away if you have chest pain. Follow these instructions at home: Medicines  Take over-the-counter and prescription medicines only as told by your doctor.  If you were prescribed an antibiotic medicine, take it as told by your doctor. Do not stop taking the antibiotic even if you start to feel better. Lifestyle   Rest as told by your doctor.  Do not use any products that contain nicotine or tobacco, such as cigarettes, e-cigarettes, and chewing tobacco. If you need help quitting, ask your doctor.  Do not drink alcohol.  Make lifestyle changes as told by your doctor. These may include: ? Getting regular exercise. Ask your doctor what activities are safe for you. ? Eating a heart-healthy diet. A diet and nutrition specialist (dietitian) can help you to learn healthy eating options. ? Staying at a healthy weight. ? Treating diabetes or high blood pressure, if needed. ? Lowering your stress. Activities such as yoga and relaxation techniques can help. General instructions  Pay attention to any changes in your symptoms. Tell your doctor about them or any new symptoms.  Avoid any activities that cause chest pain.  Keep all follow-up visits as told by your doctor. This is important. You may need more testing if your chest pain does not go away. Contact a doctor if:  Your chest pain does not go away.  You feel depressed.  You have a fever. Get help right away if:  Your chest pain is worse.  You have a cough that gets worse, or you cough up blood.  You have very bad (severe) pain in your belly (abdomen).  You pass  out (faint).  You have either of these for no clear reason: ? Sudden chest discomfort. ? Sudden discomfort in your arms, back, neck, or jaw.  You have shortness of breath at any time.  You suddenly start to sweat, or your skin gets clammy.  You feel sick to  your stomach (nauseous).  You throw up (vomit).  You suddenly feel lightheaded or dizzy.  You feel very weak or tired.  Your heart starts to beat fast, or it feels like it is skipping beats. These symptoms may be an emergency. Do not wait to see if the symptoms will go away. Get medical help right away. Call your local emergency services (911 in the [place] Do not drive yourself to the hospital. Summary  Chest pain can be caused by many different conditions. The cause may be serious and need treatment right away. If you have chest pain, see your doctor right away.  Follow your doctor's instructions for taking medicines and making lifestyle changes.  Keep all follow-up visits as told by your doctor. This includes visits for any further testing if your chest pain does not go away.  Be sure to know the signs that show that your condition has become worse. Get help right away if you have these symptoms. This information is not intended to replace advice given to you by your health care provider. Make sure you discuss any questions you have with your health care provider. Document Released: 07/10/2007 Document Revised: 07/24/2017 Document Reviewed: 07/24/2017 Elsevier Interactive Patient Education  [employer].     If you have lab work done today you will be contacted with your lab results within the next 2 weeks.  If you have not heard from [place] then please contact us. The fastest way to get your results is to register for My Chart.   IF you received an x-ray today, you will receive an invoice from [facility]. Please contact [facility] at [phone] with questions or concerns regarding your invoice.   IF you received labwork today, you will receive an invoice from [facility]. Please contact [facility] at [phone] with questions or concerns regarding your invoice.   Our billing staff will not be able to assist you with questions regarding bills from these  companies.  You will be contacted with the lab results as soon as they are available. The fastest way to get your results is to activate your My Chart account. Instructions are located on the last page of this paperwork. If you have not heard from [place] regarding the results in 2 weeks, please contact this office.         Signed, Merri Ray, MD [facility]

## 2018-10-14 DIAGNOSIS — M4317 Spondylolisthesis, lumbosacral region: Secondary | ICD-10-CM | POA: Diagnosis not present

## 2018-10-14 DIAGNOSIS — M545 Low back pain: Secondary | ICD-10-CM | POA: Diagnosis not present

## 2018-10-14 DIAGNOSIS — M5416 Radiculopathy, lumbar region: Secondary | ICD-10-CM | POA: Diagnosis not present

## 2018-10-15 ENCOUNTER — Other Ambulatory Visit: Payer: Self-pay

## 2018-10-15 ENCOUNTER — Ambulatory Visit (INDEPENDENT_AMBULATORY_CARE_PROVIDER_SITE_OTHER): Payer: 59 | Admitting: Family Medicine

## 2018-10-15 ENCOUNTER — Encounter: Payer: Self-pay | Admitting: Family Medicine

## 2018-10-15 VITALS — BP 113/69 | HR 89 | Temp 98.3°F | Resp 16 | Wt 196.0 lb

## 2018-10-15 DIAGNOSIS — H6122 Impacted cerumen, left ear: Secondary | ICD-10-CM

## 2018-10-15 NOTE — Progress Notes (Signed)
Patient ID: Candice Pratt, female    DOB: 04-Apr-1965  Age: 53 y.o. MRN: [id number]  Chief Complaint  Patient presents with  . Ear Pain    both ears are painful and wax buildup     Subjective:   Patient has a long history of ears feeling a little plugged up.  She says they are itchy and she has dry skin in them.  She thinks she has a wax plug in them left worse than right.  Has never had it professionally irrigated though she is tried to use a kit at home.  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  Current allergies, medications, problem list, past/family and social histories reviewed.  Objective:  BP 113/69   Pulse 89   Temp 98.3 F (36.8 C) (Oral)   Resp 16   Wt 196 lb (88.9 kg)   SpO2 98%   BMI 28.12 kg/m   No major acute distress.  Right TM is visible.  There is a little wax on the floor of the canal.  There is a little dry flaky skin.  Left ear looks difficult to visualize due to a plug of wax in the middle portion of the canal.  Assessment & Plan:   Assessment: 1. Impacted cerumen of left ear       Plan: We will get the cerumen irrigated out.  No orders of the defined types were placed in this encounter.   No orders of the defined types were placed in this encounter.   Check of the ear after irrigation reveals no wax and drum looks intact.  Canal is a little redness from the irrigation.     Patient Instructions    Cerumen impaction is a common problem.  I recommend that you put it on your calendar to remind yourself to clean them with 1 of the earwax softening drops such as Debrox twice a year to keep it from allowing it to build up badly.  Return if further problems   If you have lab work done today you will be contacted with your lab results within the next 2 weeks.  If you have not heard from [place] then please contact us. The fastest way to get your results is to register for My Chart.   IF you received an x-ray today, you will  receive an invoice from [facility]. Please contact [facility] at [phone] with questions or concerns regarding your invoice.   IF you received labwork today, you will receive an invoice from [place]. Please contact [facility] at [phone] with questions or concerns regarding your invoice.   Our billing staff will not be able to assist you with questions regarding bills from these companies.  You will be contacted with the lab results as soon as they are available. The fastest way to get your results is to activate your My Chart account. Instructions are located on the last page of this paperwork. If you have not heard from [place] regarding the results in 2 weeks, please contact this office.         Return if symptoms worsen or fail to improve.   Ruben Reason, MD 10/15/2018

## 2018-10-15 NOTE — Patient Instructions (Addendum)
  Cerumen impaction is a common problem.  I recommend that you put it on your calendar to remind yourself to clean them with 1 of the earwax softening drops such as Debrox twice a year to keep it from allowing it to build up badly.  Return if further problems   If you have lab work done today you will be contacted with your lab results within the next 2 weeks.  If you have not heard from [place] then please contact us. The fastest way to get your results is to register for My Chart.   IF you received an x-ray today, you will receive an invoice from [facility]. Please contact [facility] at [phone] with questions or concerns regarding your invoice.   IF you received labwork today, you will receive an invoice from [place]. Please contact [facility] at [phone] with questions or concerns regarding your invoice.   Our billing staff will not be able to assist you with questions regarding bills from these companies.  You will be contacted with the lab results as soon as they are available. The fastest way to get your results is to activate your My Chart account. Instructions are located on the last page of this paperwork. If you have not heard from [place] regarding the results in 2 weeks, please contact this office.

## 2019-09-24 ENCOUNTER — Other Ambulatory Visit: Payer: Self-pay

## 2019-09-24 ENCOUNTER — Encounter: Payer: Self-pay | Admitting: Family Medicine

## 2019-09-24 ENCOUNTER — Telehealth (INDEPENDENT_AMBULATORY_CARE_PROVIDER_SITE_OTHER): Payer: 59 | Admitting: Family Medicine

## 2019-09-24 DIAGNOSIS — R05 Cough: Secondary | ICD-10-CM

## 2019-09-24 DIAGNOSIS — R112 Nausea with vomiting, unspecified: Secondary | ICD-10-CM

## 2019-09-24 DIAGNOSIS — R059 Cough, unspecified: Secondary | ICD-10-CM

## 2019-09-24 DIAGNOSIS — U071 COVID-19: Secondary | ICD-10-CM

## 2019-09-24 MED ORDER — ONDANSETRON 4 MG PO TBDP: 4.0000 mg | ORAL_TABLET | Freq: Three times a day (TID) | ORAL | 1 refills | Status: DC | PRN

## 2019-09-24 MED ORDER — BENZONATATE 100 MG PO CAPS: 100.0000 mg | ORAL_CAPSULE | Freq: Three times a day (TID) | ORAL | 1 refills | Status: DC | PRN

## 2019-09-24 NOTE — Progress Notes (Signed)
Patient ID: Candice Pratt, female    DOB: [birth year]  Age: 54 y.o. MRN: [id number]  Chief Complaint  Patient presents with  . covid positive    herself as well as her husband - sx began 8/17  . Cough    minor chest congestion- body aches, chills, sweats, vomiting, fatigue, runny nose, diarrhea    Subjective:     Video medicine.  Patient identified herself appropriately and is in the privacy of her home.  Patient called in with COVID-19.  Her husband first caught the virus, and then several days ago she developed symptoms.  She has not been severely ill.  Her symptoms started early in the week, about 8/17.  She has had some chills but no documented fevers.  She has coughed a lot, more when she is laying down.  She is coughed and she vomits periodically.  Does not have a lot of nausea.  She has had some diarrhea.  She has had a runny nose and fatigue.  Loss of taste and smell.    She has been fully vaccinated for Covid.  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  She is not feeling like she is in any danger.  A couple of times when she is coughing real hard she got short of breath and a little scared.  She knows not to leave the home until her necessary quarantine times is up.    Current allergies, medications, problem list, past/family and social histories reviewed.  Objective:  There were no vitals taken for this visit.  Video medicine visit.  Cannot examine her but she does not look terribly ill.  Assessment & Plan:   Assessment: 1. COVID-19   2. Cough   3. Non-intractable vomiting with nausea, unspecified vomiting type       Plan: See instructions  No orders of the defined types were placed in this encounter.   Meds ordered this encounter  Medications  . benzonatate (TESSALON) 100 MG capsule    Sig: Take 1-2 capsules (100-200 mg total) by mouth 3 (three) times  daily as needed.    Dispense:  40 capsule    Refill:  1  . ondansetron (ZOFRAN ODT) 4 MG disintegrating tablet    Sig: Take 1 tablet (4 mg total) by mouth every 8 (eight) hours as needed for nausea or vomiting.    Dispense:  20 tablet    Refill:  1         Patient Instructions  Continue taking the benzonatate cough pills 100 mg 1 or 2 3 times daily as needed for cough  You can use the DM-containing cough syrup such as Delsym in addition to the benzonatate.  I am prescribing you some Zofran (ondansetron) for as needed use for nausea or vomiting  Stay well-hydrated  If you get suddenly worse seek additional help.  As you know we are closed on the weekend and you might have to go to an urgent care or emergency room.  Contact us next week if we can be of further assistance.  Stay off work as per the instructions you got from [facility].    Return if symptoms worsen or fail to improve.   Ruben Reason, MD 09/24/2019

## 2019-09-24 NOTE — Patient Instructions (Addendum)
Continue taking the benzonatate cough pills 100 mg 1 or 2 3 times daily as needed for cough  You can use the DM-containing cough syrup such as Delsym in addition to the benzonatate.  I am prescribing you some Zofran (ondansetron) for as needed use for nausea or vomiting  Stay well-hydrated  If you get suddenly worse seek additional help.  As you know we are closed on the weekend and you might have to go to an urgent care or emergency room.  Contact us next week if we can be of further assistance.  Stay off work as per the instructions you got from [facility].

## 2019-10-27 ENCOUNTER — Encounter: Payer: Self-pay | Admitting: Registered Nurse

## 2019-10-27 ENCOUNTER — Other Ambulatory Visit: Payer: Self-pay

## 2019-10-27 ENCOUNTER — Ambulatory Visit: Payer: 59 | Admitting: Registered Nurse

## 2019-10-27 VITALS — BP 120/67 | HR 87 | Temp 98.2°F | Resp 18 | Ht 70.0 in | Wt 199.4 lb

## 2019-10-27 DIAGNOSIS — M4306 Spondylolysis, lumbar region: Secondary | ICD-10-CM

## 2019-10-27 DIAGNOSIS — H938X1 Other specified disorders of right ear: Secondary | ICD-10-CM | POA: Diagnosis not present

## 2019-10-27 DIAGNOSIS — L299 Pruritus, unspecified: Secondary | ICD-10-CM | POA: Diagnosis not present

## 2019-10-27 MED ORDER — HYDROCORTISONE-ACETIC ACID 1-2 % OT SOLN: 3.0000 [drp] | Freq: Three times a day (TID) | OTIC | 2 refills | Status: DC

## 2019-10-27 MED ORDER — METHOCARBAMOL 750 MG PO TABS: 750.0000 mg | ORAL_TABLET | Freq: Four times a day (QID) | ORAL | 1 refills | Status: AC | PRN

## 2019-10-27 MED ORDER — FLUTICASONE PROPIONATE 50 MCG/ACT NA SUSP: 2.0000 | Freq: Every day | NASAL | 6 refills | Status: DC

## 2019-10-27 NOTE — Progress Notes (Signed)
Acute Office Visit  Subjective:    Patient ID: Candice Pratt, female    DOB: 22-Apr-1965, 54 y.o.   MRN: [id number]  Chief Complaint  Patient presents with  . Ear Pain    patient states she has been having some right ear pain for the last 24 hours . Per patient its starts from the temple down to the jaw with pain    HPI Patient is in today for r ear pain Onset 24 hours ago. Somewhat sudden.  Pressure and pain feels deep. Worse with some movements, but no place on head tender to palpation No drainage from ear No change to hearing No other upper respiratory symptoms No sick contacts  Notes her ears are consistently itchy - has had some dermatitis in ears in the past with flaking and much cerumen accumulation. Has not found ideal treatment for itching.   Otherwise, requesting refill on methocarbamol. Last fill around 1 year ago. Tolerates it well. Uses it usually around 1-2 times each month for lower back spasm - hx of lumbar fusion, occ gets spasm with sciatica after a long day at work.  Past Medical History:  Diagnosis Date  . Asthma   . Bronchitis, chronic ([place])   . GERD (gastroesophageal reflux disease)   . Hiatal hernia   . Spondylolisthesis of lumbar region   . Stress incontinence   . Wears contact lenses     Past Surgical History:  Procedure Laterality Date  . ESOPHAGOGASTRODUODENOSCOPY    . KNEE SURGERY    . SPINE SURGERY N/A    [employer]rees[employer] 09/23/2019    Family History  Problem Relation Age of Onset  . Diabetes Other   . Cancer Other   . Cancer Mother   . Diabetes Brother   . Hypertension Brother   . Stroke Maternal Grandmother     Social History   Socioeconomic History  . Marital status: Married    Spouse name: Not on file  . Number of children: 3  . Years of education: Not on file  . Highest education level: Not on file  Occupational History    Employer: [employer]  Tobacco Use  . Smoking status: Current Every Day Smoker    Packs/day: 0.50     Years: 20.00    Pack years: 10.00    Types: Cigarettes  . Smokeless tobacco: Never Used  Vaping Use  . Vaping Use: Never used  Substance and Sexual Activity  . Alcohol use: Yes    Alcohol/week: 1.0 standard drink    Types: 1 Glasses of wine per week  . Drug use: No  . Sexual activity: Yes    Birth control/protection: I.U.D.  Other Topics Concern  . Not on file  Social History Narrative  . Not on file   Social Determinants of Health   Financial Resource Strain:   . Difficulty of Paying Living Expenses: Not on file  Food Insecurity:   . Worried About Charity fundraiser in the Last Year: Not on file  . Ran Out of Food in the Last Year: Not on file  Transportation Needs:   . Lack of Transportation (Medical): Not on file  . Lack of Transportation (Non-Medical): Not on file  Physical Activity:   . Days of Exercise per Week: Not on file  . Minutes of Exercise per Session: Not on file  Stress:   . Feeling of Stress : Not on file  Social Connections:   . Frequency of Communication with Friends  and Family: Not on file  . Frequency of Social Gatherings with Friends and Family: Not on file  . Attends Religious Services: Not on file  . Active Member of Clubs or Organizations: Not on file  . Attends Archivist Meetings: Not on file  . Marital Status: Not on file  Intimate Partner Violence:   . Fear of Current or Ex-Partner: Not on file  . Emotionally Abused: Not on file  . Physically Abused: Not on file  . Sexually Abused: Not on file    Outpatient Medications Prior to Visit  Medication Sig Dispense Refill  . acetaminophen (TYLENOL) 500 MG tablet Take 1,000 mg by mouth every 4 (four) hours as needed.     . benzonatate (TESSALON) 100 MG capsule Take 1-2 capsules (100-200 mg total) by mouth 3 (three) times daily as needed. 40 capsule 1  . calcium carbonate (TUMS - DOSED IN MG ELEMENTAL CALCIUM) 500 MG chewable tablet Chew 1 tablet by mouth daily.    Marland Kitchen levonorgestrel  (MIRENA) 20 MCG/24HR IUD 1 each by Intrauterine route once.     Marland Kitchen omeprazole (PRILOSEC) 20 MG capsule Take 1 capsule (20 mg total) by mouth daily. 30 capsule 1  . ondansetron (ZOFRAN ODT) 4 MG disintegrating tablet Take 1 tablet (4 mg total) by mouth every 8 (eight) hours as needed for nausea or vomiting. 20 tablet 1  . sucralfate (CARAFATE) 1 g tablet Take 1 tablet (1 g total) by mouth 4 (four) times daily -  with meals and at bedtime. 30 tablet 0  . Polyethylene Glycol 400 (BLINK TEARS OP) Place 1 drop into both eyes daily as needed (dry eyes). (Patient not taking: Reported on 09/24/2019)    . traMADol (ULTRAM) 50 MG tablet Take 50 mg by mouth at bedtime as needed for pain. (Patient not taking: Reported on 09/24/2019)  0  . methocarbamol (ROBAXIN-750) 750 MG tablet Take 1 tablet (750 mg total) by mouth every 6 (six) hours as needed for muscle spasms. (Patient not taking: Reported on 09/24/2019) 60 tablet 1   No facility-administered medications prior to visit.    Allergies  Allergen Reactions  . Other Rash and Swelling  . Beeswax Swelling    When applied topically  . Chlorhexidine Gluconate Itching    Review of Systems  Constitutional: Negative.   HENT: Positive for ear pain.   Eyes: Negative.   Respiratory: Negative.   Cardiovascular: Negative.   Gastrointestinal: Negative.   Genitourinary: Negative.   Musculoskeletal: Positive for back pain.  Skin: Negative.   Neurological: Negative.   Psychiatric/Behavioral: Negative.        Objective:    Physical Exam Vitals and nursing note reviewed.  Constitutional:      General: She is not in acute distress.    Appearance: Normal appearance. She is not ill-appearing, toxic-appearing or diaphoretic.  HENT:     Head: Normocephalic and atraumatic.     Right Ear: Hearing, ear canal and external ear normal. A middle ear effusion is present. There is no impacted cerumen. No hemotympanum. Tympanic membrane is bulging. Tympanic membrane is  not perforated. Tympanic membrane has normal mobility.  Cardiovascular:     Rate and Rhythm: Normal rate and regular rhythm.  Pulmonary:     Effort: Pulmonary effort is normal. No respiratory distress.  Skin:    General: Skin is warm and dry.     Capillary Refill: Capillary refill takes less than 2 seconds.     Coloration: Skin is not jaundiced or  pale.     Findings: No bruising, erythema, lesion or rash.  Neurological:     General: No focal deficit present.     Mental Status: She is alert and oriented to person, place, and time. Mental status is at baseline.  Psychiatric:        Mood and Affect: Mood normal.        Behavior: Behavior normal.        Thought Content: Thought content normal.        Judgment: Judgment normal.     BP 120/67   Pulse 87   Temp 98.2 F (36.8 C) (Temporal)   Resp 18   Ht 5\' 10"  (1.778 m)   Wt 199 lb 6.4 oz (90.4 kg)   SpO2 97%   BMI 28.61 kg/m  Wt Readings from Last 3 Encounters:  10/27/19 199 lb 6.4 oz (90.4 kg)  10/15/18 196 lb (88.9 kg)  05/27/18 190 lb (86.2 kg)    There are no preventive care reminders to display for this patient.  There are no preventive care reminders to display for this patient.   Lab Results  Component Value Date   TSH 2.110 03/31/2009   Lab Results  Component Value Date   WBC 6.4 10/01/2017   HGB 12.6 10/01/2017   HCT 39.7 10/01/2017   MCV 94.1 10/01/2017   PLT 274 10/01/2017   Lab Results  Component Value Date   NA 140 10/01/2017   K 3.7 10/01/2017   CO2 31 10/01/2017   GLUCOSE 83 10/01/2017   BUN 15 10/01/2017   CREATININE 0.92 10/01/2017   BILITOT 0.5 08/31/2015   ALKPHOS 17 (L) 08/31/2015   AST 11 08/31/2015   ALT 9 08/31/2015   PROT 7.2 08/31/2015   ALBUMIN 4.5 08/31/2015   CALCIUM 9.6 10/01/2017   ANIONGAP 7 10/01/2017   No results found for: CHOL No results found for: HDL No results found for: LDLCALC No results found for: TRIG No results found for: CHOLHDL No results found for:  HGBA1C     Assessment & Plan:   Problem List Items Addressed This Visit      Musculoskeletal and Integument   Spondylolysis, lumbar region - Primary   Relevant Medications   methocarbamol (ROBAXIN-750) 750 MG tablet    Other Visit Diagnoses    Ear itching       Relevant Medications   acetic acid-hydrocortisone (VOSOL-HC) OTIC solution   Pressure sensation in right ear       Relevant Medications   fluticasone (FLONASE) 50 MCG/ACT nasal spray       Meds ordered this encounter  Medications  . fluticasone (FLONASE) 50 MCG/ACT nasal spray    Sig: Place 2 sprays into both nostrils daily.    Dispense:  16 g    Refill:  6    Order Specific Question:   Supervising Provider    Answer:   Carlota Raspberry, JEFFREY R [[id number]]  . acetic acid-hydrocortisone (VOSOL-HC) OTIC solution    Sig: Place 3 drops into both ears 3 (three) times daily.    Dispense:  10 mL    Refill:  2    Order Specific Question:   Supervising Provider    Answer:   Carlota Raspberry, JEFFREY R [[id number]]  . methocarbamol (ROBAXIN-750) 750 MG tablet    Sig: Take 1 tablet (750 mg total) by mouth every 6 (six) hours as needed for muscle spasms.    Dispense:  60 tablet    Refill:  1  Order Specific Question:   Supervising Provider    Answer:   Carlota Raspberry, JEFFREY R [[id number]]   PLAN  Fluid behind TM does not appear purulent. Will try resuming zyrtec, mucinex, and flonase.  Try vosol for itching ears  Refill robaxin  Return to clinic if worsening or failing to improve  Patient encouraged to call clinic with any questions, comments, or concerns.  Maximiano Coss, NP

## 2019-10-27 NOTE — Patient Instructions (Addendum)
Does not appear to be an infected ear.  Try zyrtec twice daily for next three to four days.  Start using flonase and mucinex as instructed. If no improvement or any worsening by Friday let me know  Try the vosol drops for itching in ears. Can use three drops in each ear up to three times daily as needed.   Your robaxin has been refilled.    If you have lab work done today you will be contacted with your lab results within the next 2 weeks.  If you have not heard from [place] then please contact us. The fastest way to get your results is to register for My Chart.   IF you received an x-ray today, you will receive an invoice from [facility]. Please contact [facility] at [phone] with questions or concerns regarding your invoice.   IF you received labwork today, you will receive an invoice from [facility]. Please contact [facility] at [phone] with questions or concerns regarding your invoice.   Our billing staff will not be able to assist you with questions regarding bills from these companies.  You will be contacted with the lab results as soon as they are available. The fastest way to get your results is to activate your My Chart account. Instructions are located on the last page of this paperwork. If you have not heard from [place] regarding the results in 2 weeks, please contact this office.

## 2019-10-29 ENCOUNTER — Telehealth: Payer: Self-pay | Admitting: Registered Nurse

## 2019-10-29 NOTE — Telephone Encounter (Signed)
Pt was seen on 10/27/19 and she was told to call in if she's not feeling any better. She is having a strong pain from her ear to her neck including the side of her face and deep in her ear. She would like an antibiotic for this issue.

## 2019-10-29 NOTE — Telephone Encounter (Signed)
What is the name of the medication? An antibiotic  Have you contacted your pharmacy to request a refill? Pt was seen on 10/27/19 and she was told to call in if she's not feeling any better. She is having a strong pain from her ear to her neck including the side of her face and deep in her ear. She would like an antibiotic for this issue.  Which pharmacy would you like this sent to? Pharmacy  [id number] - [place], [address] [place] [address]  Phone:  [phone] Fax:  [phone]  DEA #:  [id number]      Patient notified that their request is being sent to the clinical staff for review and that they should receive a call once it is complete. If they do not receive a call within 72 hours they can check with their pharmacy or our office.

## 2019-10-31 ENCOUNTER — Encounter: Payer: Self-pay | Admitting: Registered Nurse

## 2019-10-31 ENCOUNTER — Other Ambulatory Visit: Payer: Self-pay | Admitting: Registered Nurse

## 2019-10-31 DIAGNOSIS — H6690 Otitis media, unspecified, unspecified ear: Secondary | ICD-10-CM

## 2019-10-31 MED ORDER — AMOXICILLIN-POT CLAVULANATE 875-125 MG PO TABS: 1.0000 | ORAL_TABLET | Freq: Two times a day (BID) | ORAL | 0 refills | Status: DC

## 2019-10-31 NOTE — Progress Notes (Signed)
Patient

## 2019-12-09 IMAGING — DX DG SACRUM/COCCYX 2+V
3 series · 3 of 3 positions shown · non-contrast
Comparison: None.

CLINICAL DATA: Left sacroiliac tenderness.

EXAM:
SACRUM AND COCCYX - 2+ VIEW

[coccyx ap]
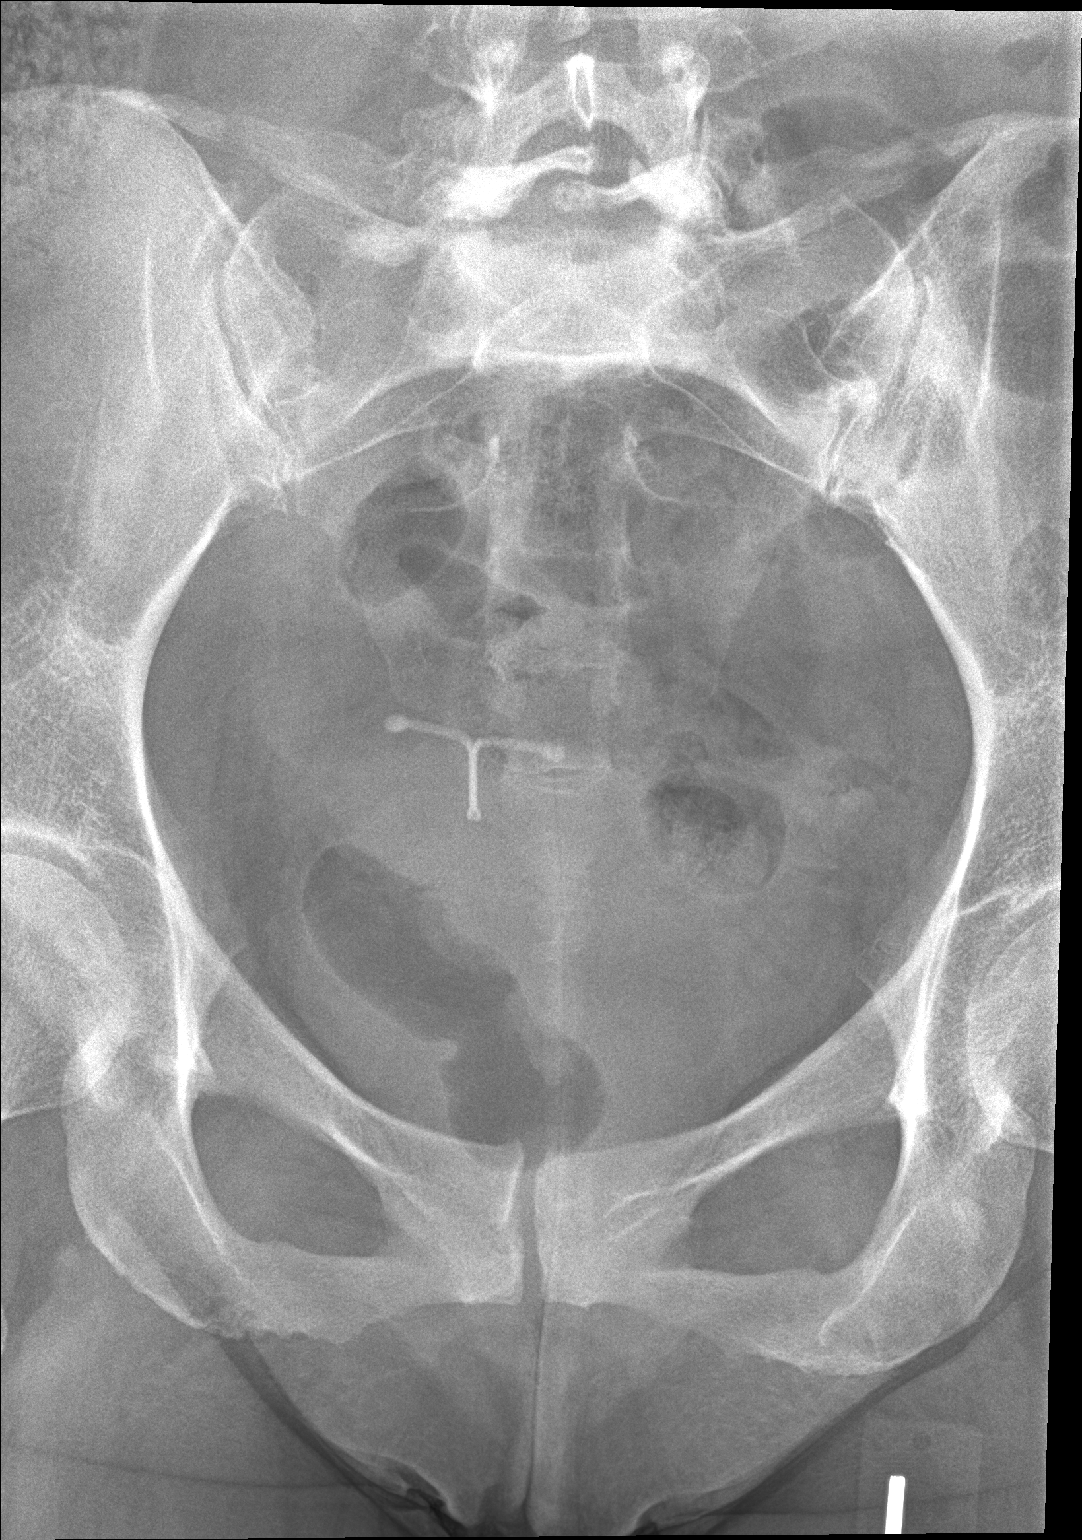

[sacrum ap]
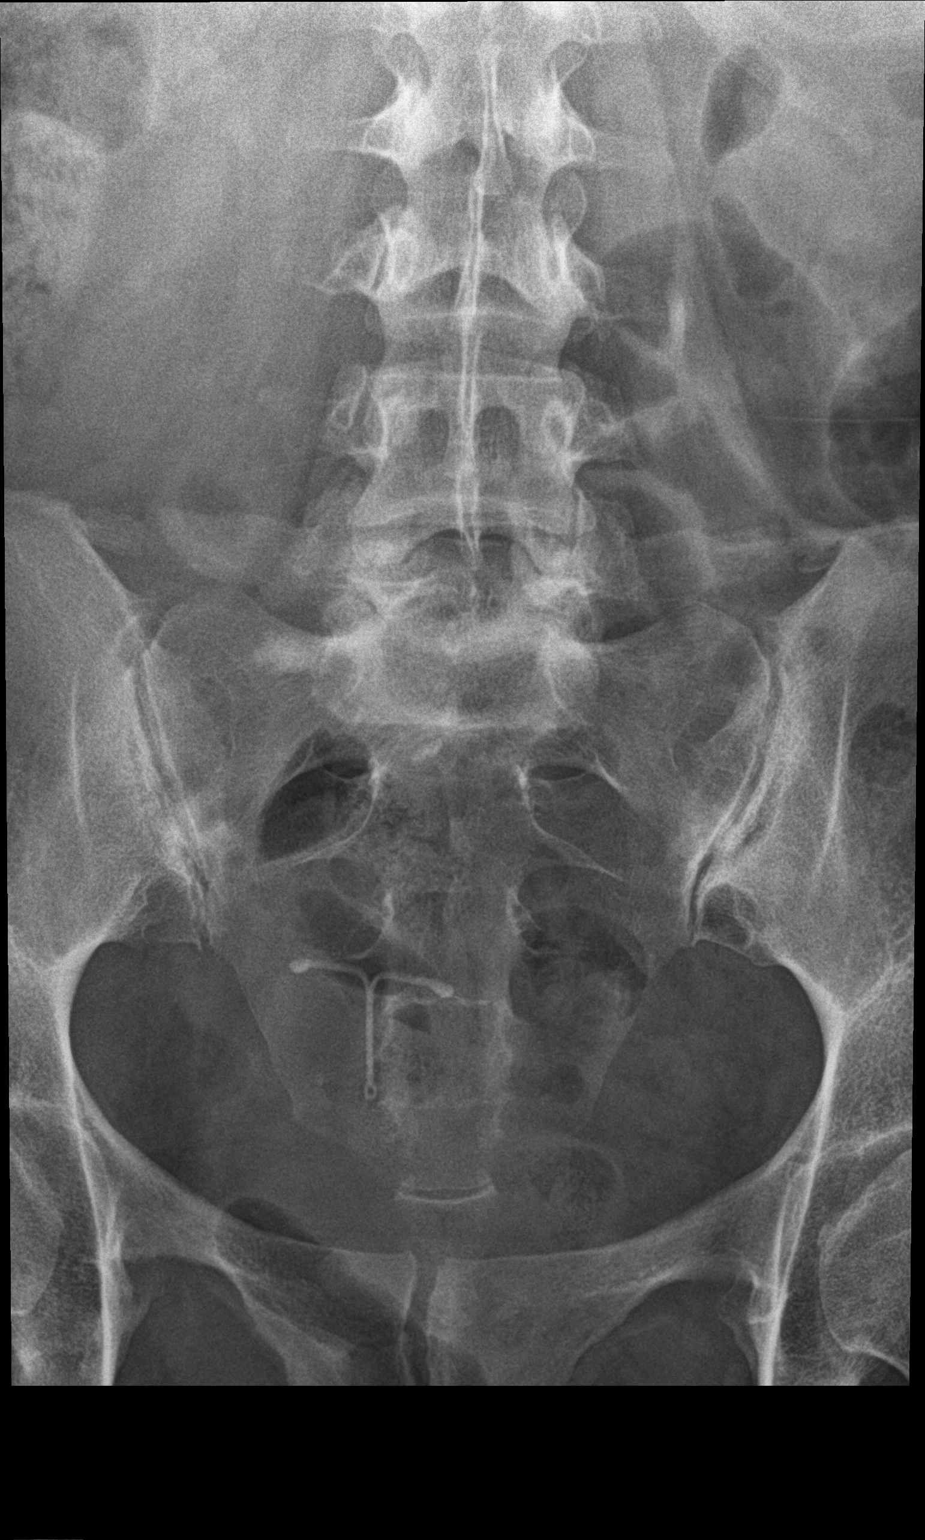

[sacrum lat]
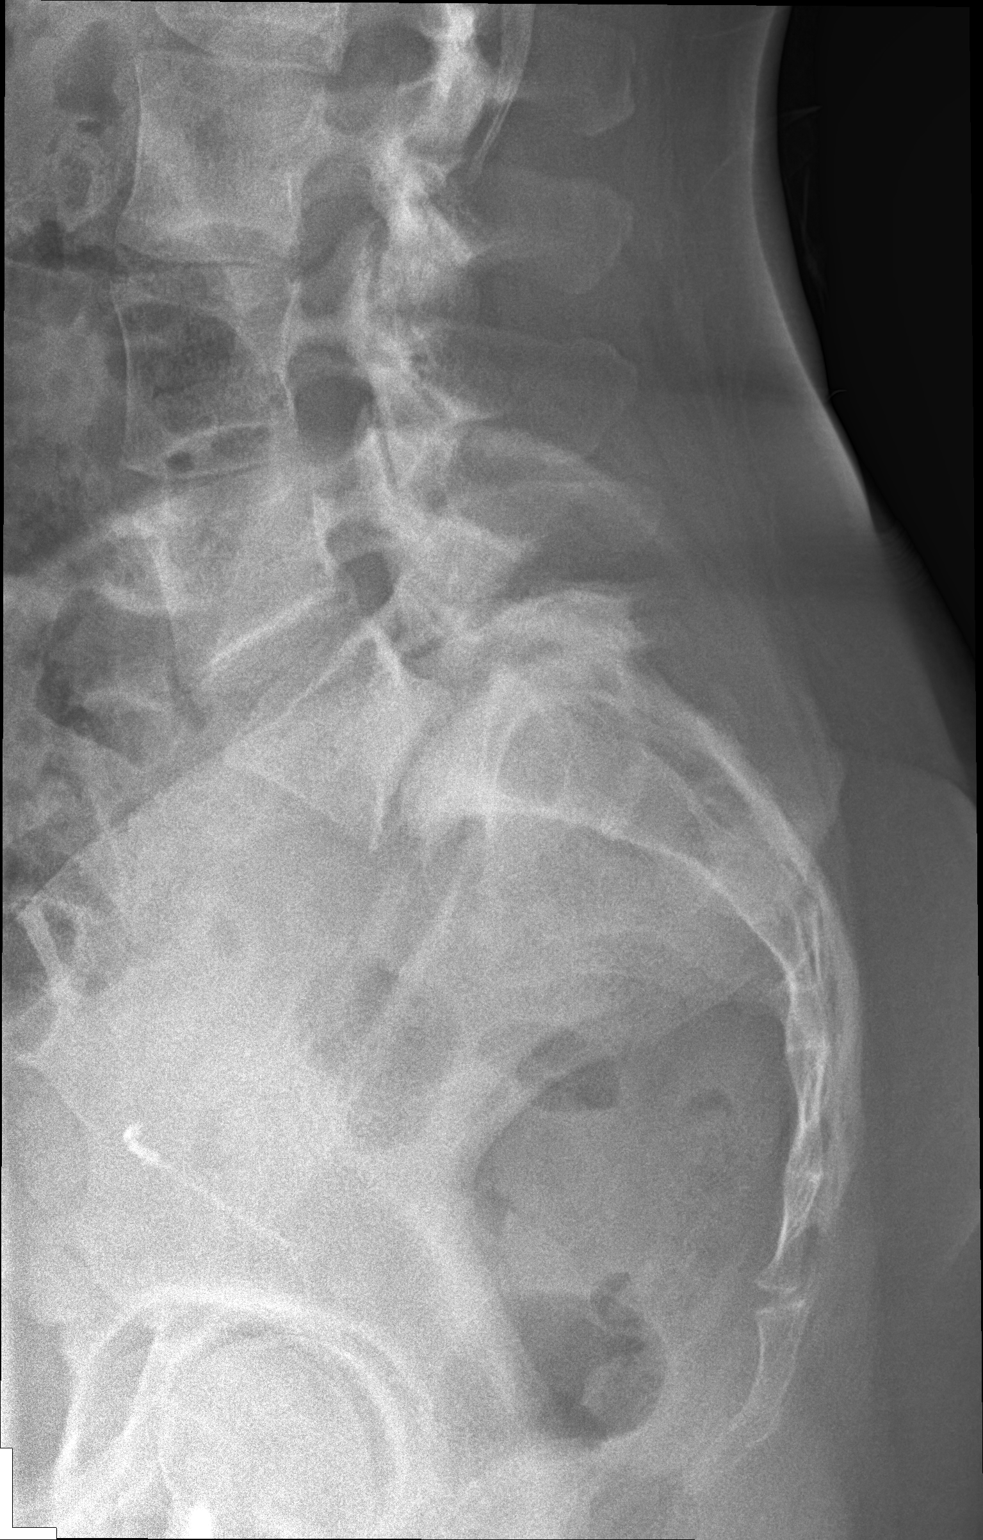

[3 of 3 positions shown; findings below may reference images not displayed]

FINDINGS: There is no evidence of fracture or other focal bone lesions.
IMPRESSION: No significant abnormality seen involving the sacrum or coccyx.

## 2019-12-09 IMAGING — DX DG LUMBAR SPINE COMPLETE 4+V
5 series · 5 of 5 positions shown · non-contrast
Comparison: Radiographs December 22, 2015.

CLINICAL DATA: Chronic bilateral low back pain with right-sided
sciatica.

EXAM:
LUMBAR SPINE - COMPLETE 4+ VIEW

[l-spine ap]
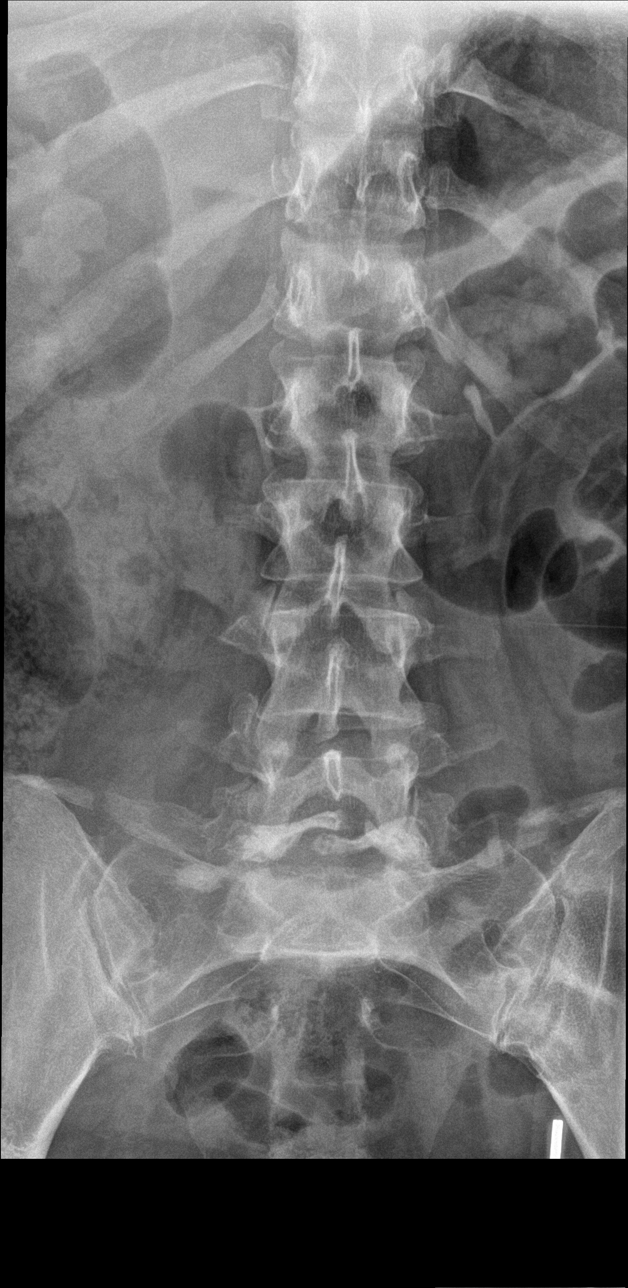

[l-spine obl (1 of 2)]
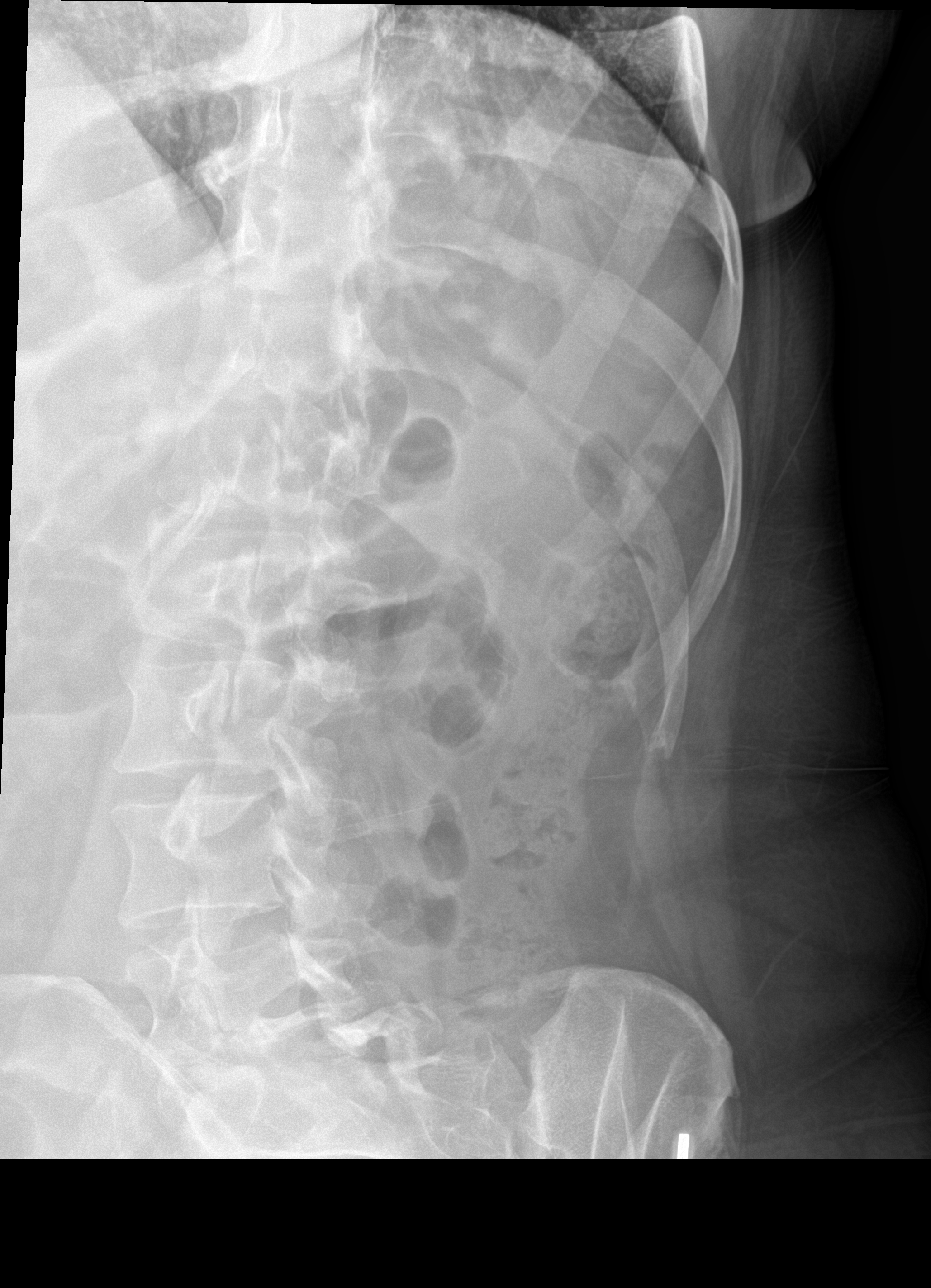

[l-spine obl (2 of 2)]
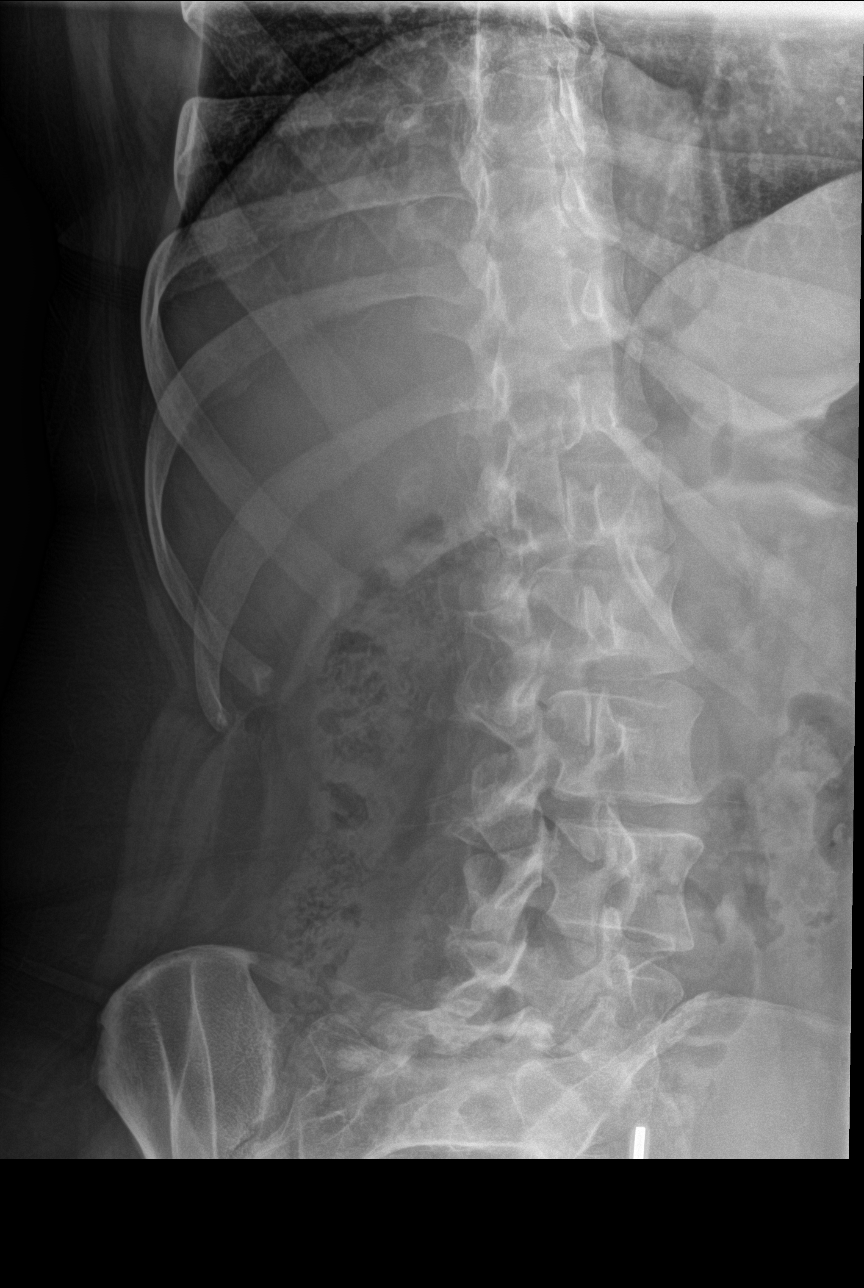

[l-spine lat]
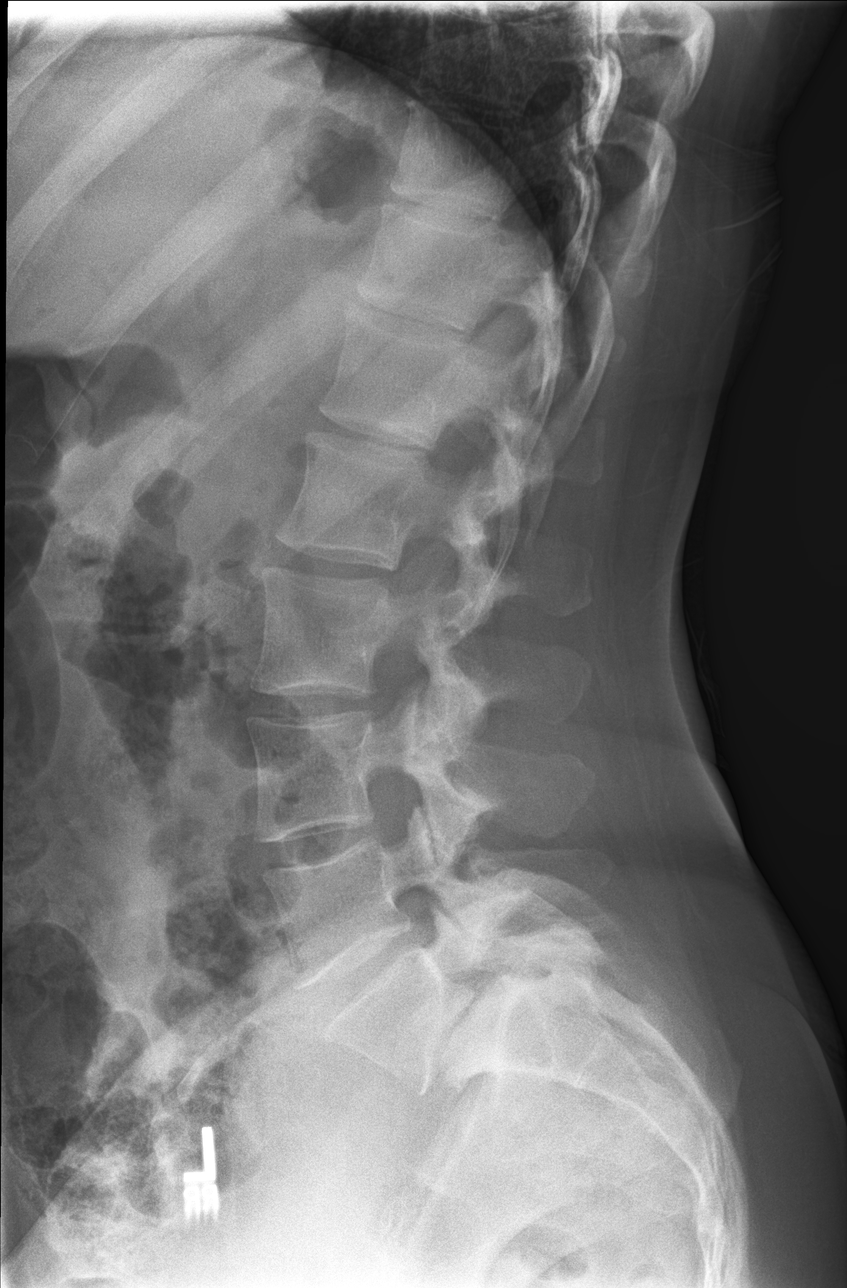

[l-spine l5-s1]
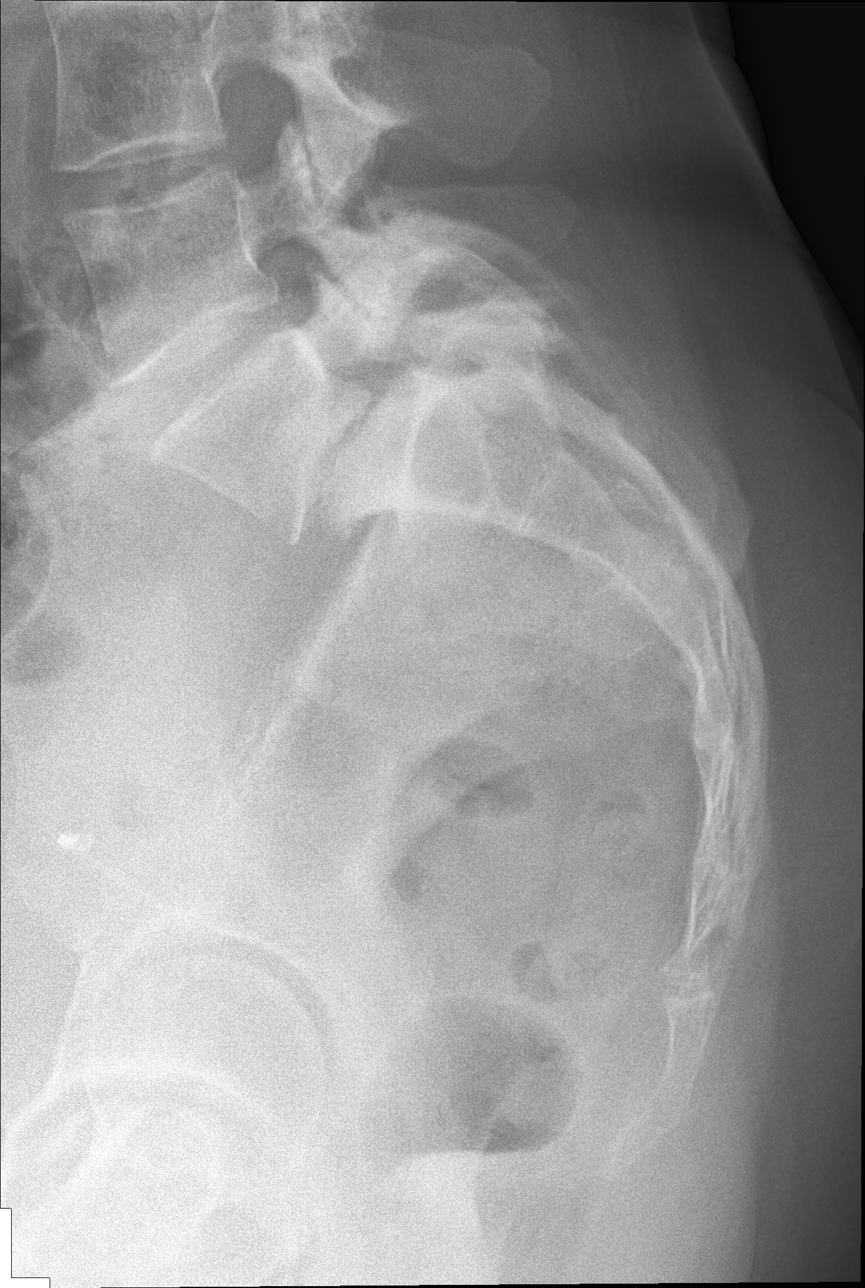

[5 of 5 positions shown; findings below may reference images not displayed]

FINDINGS: Stable grade 1 anterolisthesis of L5-S1 is noted secondary to
bilateral pars defects of L5. Severe degenerative disc disease is
noted at L5-S1. Remaining disc spaces appear intact. No fracture is
noted.
IMPRESSION: Stable grade 1 anterolisthesis of L5-S1 secondary to bilateral pars
defects of L5. Severe degenerative disc disease is noted at L5-S1.
No significant change compared to prior exam.

## 2019-12-09 IMAGING — DX DG KNEE COMPLETE 4+V*L*
4 series · 4 of 4 positions shown · non-contrast
Comparison: None.

CLINICAL DATA: Left knee pain.

EXAM:
LEFT KNEE - COMPLETE 4+ VIEW

[knee ap]
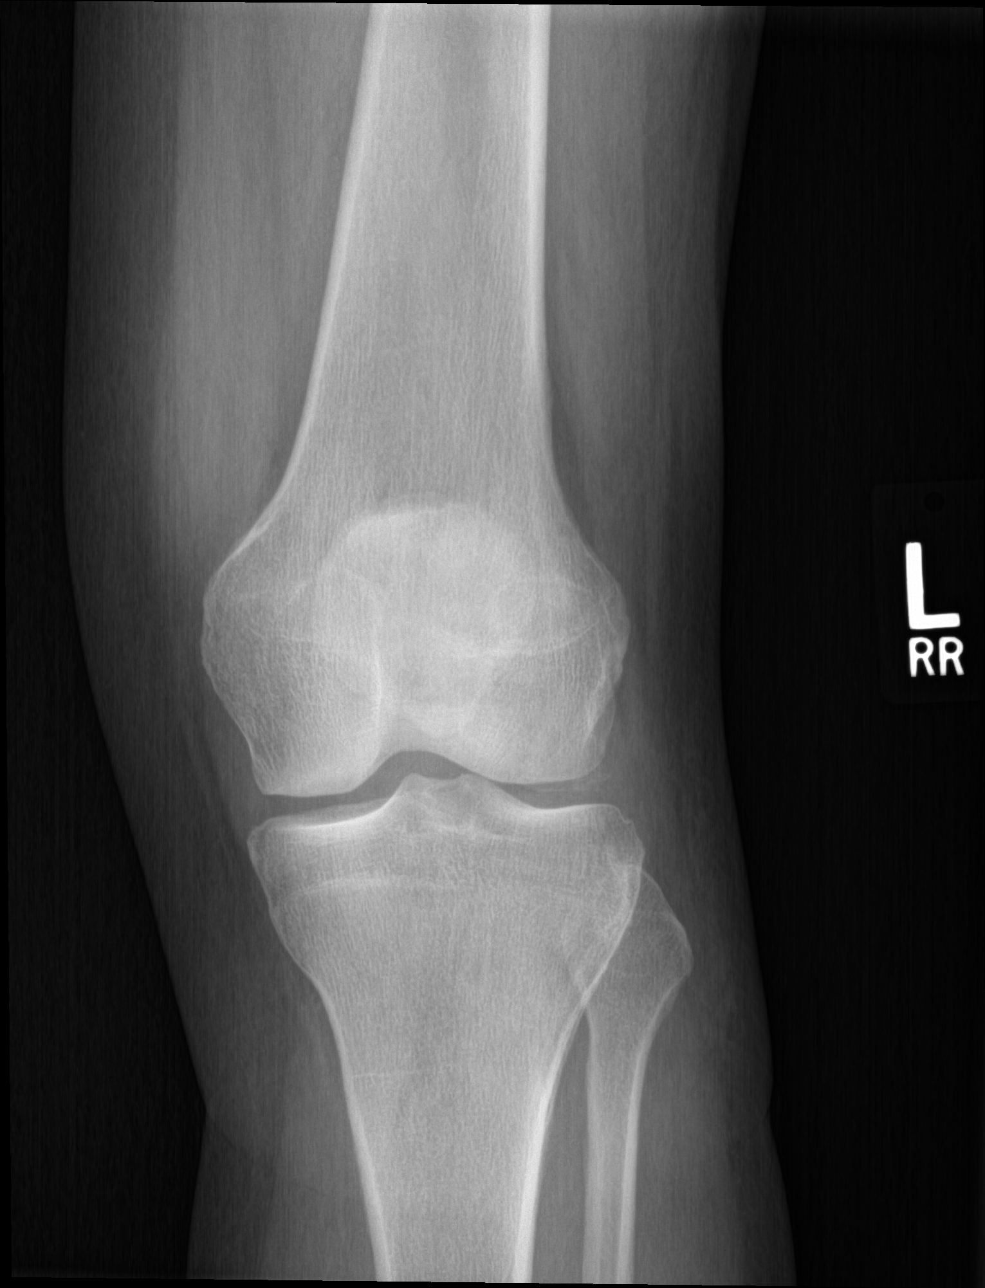

[knee obl (1 of 2)]
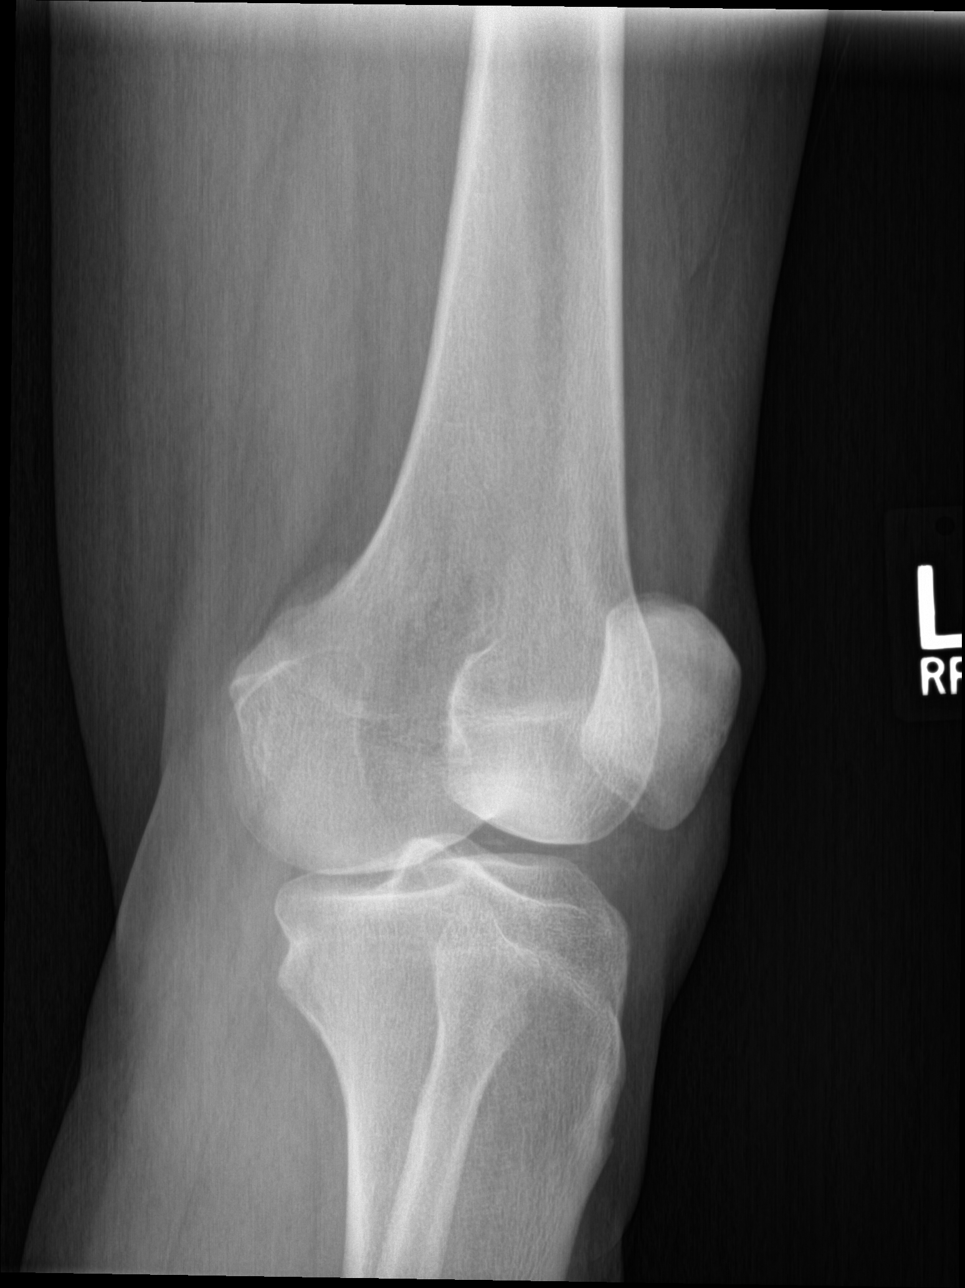

[knee obl (2 of 2)]
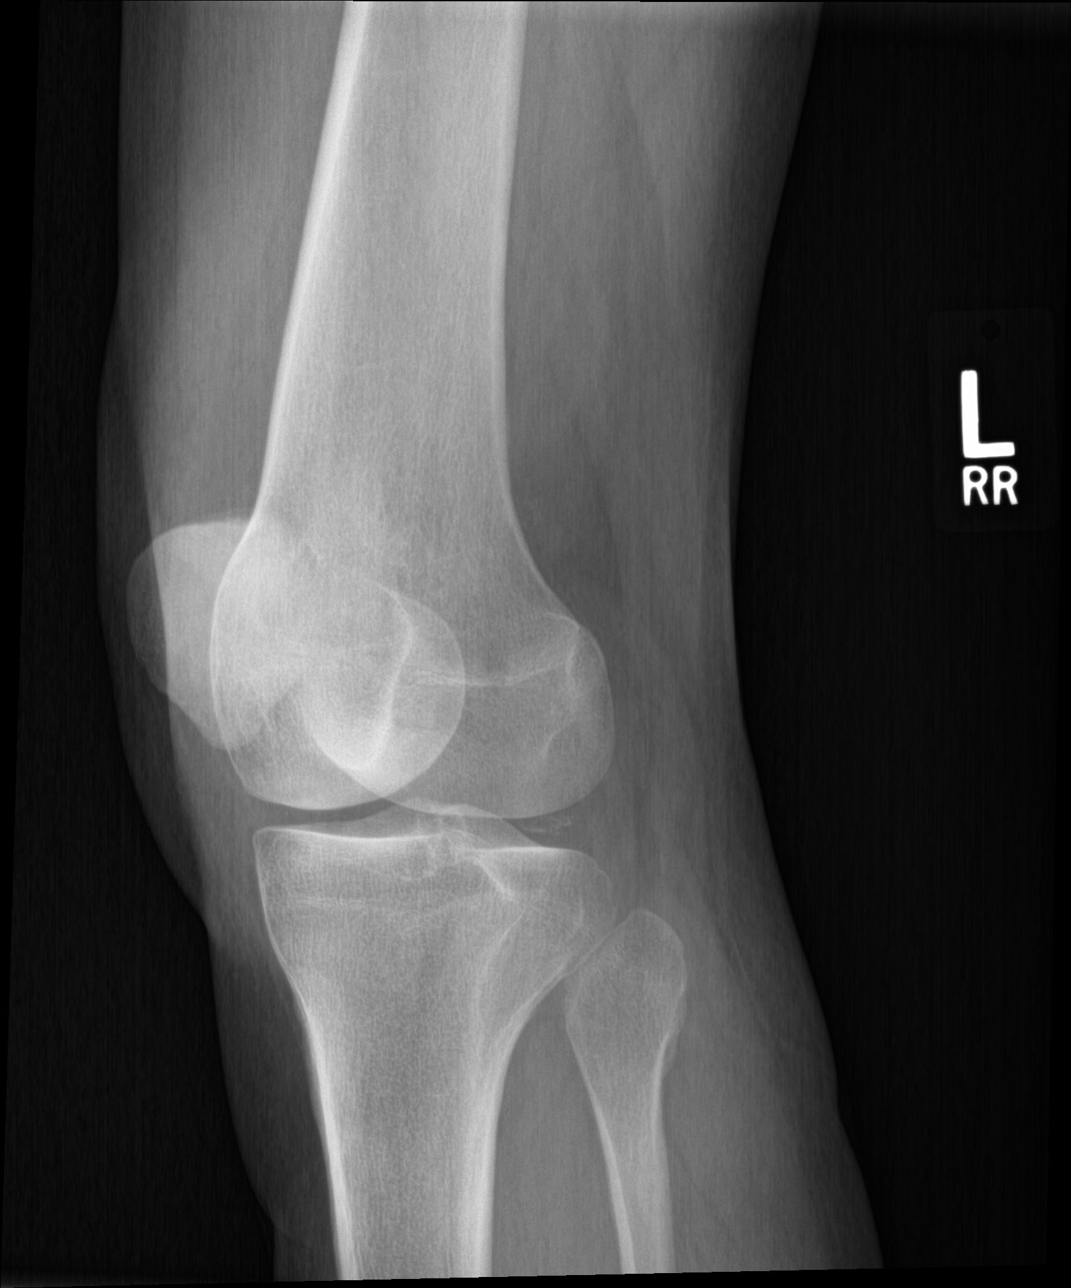

[knee lat]
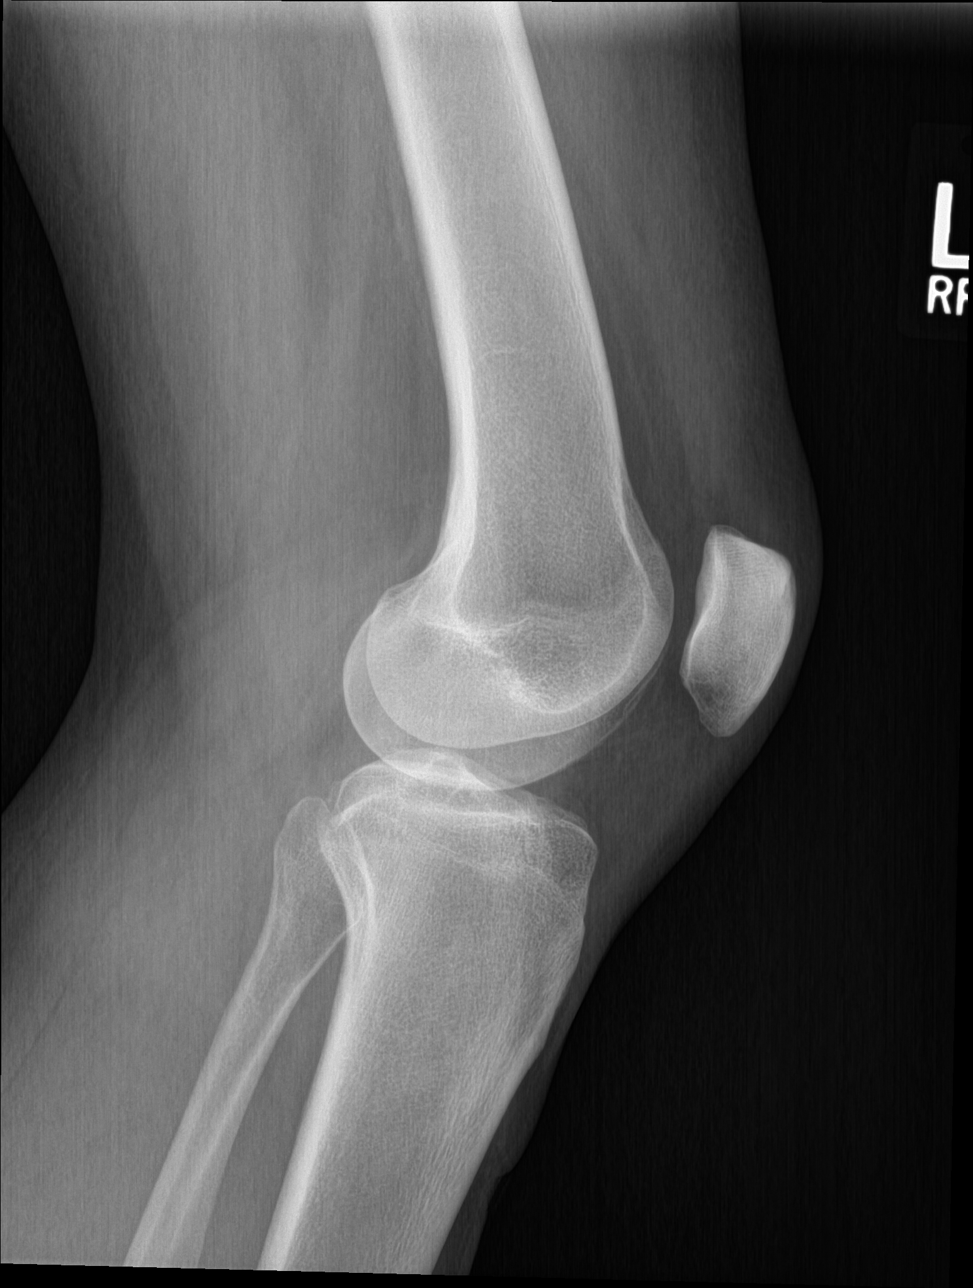

[4 of 4 positions shown; findings below may reference images not displayed]

FINDINGS: No evidence of fracture, dislocation, or joint effusion. No evidence
of arthropathy or other focal bone abnormality. Soft tissues are
unremarkable.
IMPRESSION: Normal left knee.

## 2020-08-12 DIAGNOSIS — H5213 Myopia, bilateral: Secondary | ICD-10-CM | POA: Diagnosis not present

## 2021-03-08 ENCOUNTER — Telehealth: Payer: 59 | Admitting: Physician Assistant

## 2021-03-08 ENCOUNTER — Ambulatory Visit: Payer: 59 | Admitting: Registered Nurse

## 2021-03-08 ENCOUNTER — Other Ambulatory Visit: Payer: Self-pay

## 2021-03-08 ENCOUNTER — Encounter: Payer: Self-pay | Admitting: Registered Nurse

## 2021-03-08 VITALS — BP 148/64 | HR 84 | Temp 98.0°F | Resp 18 | Ht 71.0 in | Wt 207.4 lb

## 2021-03-08 DIAGNOSIS — M792 Neuralgia and neuritis, unspecified: Secondary | ICD-10-CM

## 2021-03-08 DIAGNOSIS — R42 Dizziness and giddiness: Secondary | ICD-10-CM | POA: Diagnosis not present

## 2021-03-08 DIAGNOSIS — R519 Headache, unspecified: Secondary | ICD-10-CM

## 2021-03-08 MED ORDER — INDOMETHACIN 50 MG PO CAPS: 50.0000 mg | ORAL_CAPSULE | Freq: Three times a day (TID) | ORAL | 0 refills | Status: DC

## 2021-03-08 MED ORDER — PREDNISONE 20 MG PO TABS: 20.0000 mg | ORAL_TABLET | Freq: Every day | ORAL | 0 refills | Status: DC

## 2021-03-08 MED ORDER — GABAPENTIN 100 MG PO CAPS: 100.0000 mg | ORAL_CAPSULE | Freq: Three times a day (TID) | ORAL | 3 refills | Status: DC

## 2021-03-08 NOTE — Patient Instructions (Signed)
Candice Pratt, thank you for joining Leeanne Rio, PA-C for today's virtual visit.  While this provider is not your primary care provider (PCP), if your PCP is located in our provider database this encounter information will be shared with them immediately following your visit.  Consent: (Patient) Candice Pratt provided verbal consent for this virtual visit at the beginning of the encounter.  Current Medications:  Current Outpatient Medications:    acetaminophen (TYLENOL) 500 MG tablet, Take 1,000 mg by mouth every 4 (four) hours as needed. , Disp: , Rfl:    acetic acid-hydrocortisone (VOSOL-HC) OTIC solution, Place 3 drops into both ears 3 (three) times daily., Disp: 10 mL, Rfl: 2   amoxicillin-clavulanate (AUGMENTIN) 875-125 MG tablet, Take 1 tablet by mouth 2 (two) times daily., Disp: 14 tablet, Rfl: 0   benzonatate (TESSALON) 100 MG capsule, Take 1-2 capsules (100-200 mg total) by mouth 3 (three) times daily as needed., Disp: 40 capsule, Rfl: 1   calcium carbonate (TUMS - DOSED IN MG ELEMENTAL CALCIUM) 500 MG chewable tablet, Chew 1 tablet by mouth daily., Disp: , Rfl:    fluticasone (FLONASE) 50 MCG/ACT nasal spray, Place 2 sprays into both nostrils daily., Disp: 16 g, Rfl: 6   levonorgestrel (MIRENA) 20 MCG/24HR IUD, 1 each by Intrauterine route once. , Disp: , Rfl:    methocarbamol (ROBAXIN-750) 750 MG tablet, Take 1 tablet (750 mg total) by mouth every 6 (six) hours as needed for muscle spasms., Disp: 60 tablet, Rfl: 1   omeprazole (PRILOSEC) 20 MG capsule, Take 1 capsule (20 mg total) by mouth daily., Disp: 30 capsule, Rfl: 1   ondansetron (ZOFRAN ODT) 4 MG disintegrating tablet, Take 1 tablet (4 mg total) by mouth every 8 (eight) hours as needed for nausea or vomiting., Disp: 20 tablet, Rfl: 1   Polyethylene Glycol 400 (BLINK TEARS OP), Place 1 drop into both eyes daily as needed (dry eyes). (Patient not taking: Reported on 09/24/2019), Disp: , Rfl:    sucralfate (CARAFATE)  1 g tablet, Take 1 tablet (1 g total) by mouth 4 (four) times daily -  with meals and at bedtime., Disp: 30 tablet, Rfl: 0   traMADol (ULTRAM) 50 MG tablet, Take 50 mg by mouth at bedtime as needed for pain. (Patient not taking: Reported on 09/24/2019), Disp: , Rfl: 0   Medications ordered in this encounter:  No orders of the defined types were placed in this encounter.    *If you need refills on other medications prior to your next appointment, please contact your pharmacy*  Follow-Up: Call back or seek an in-person evaluation if the symptoms worsen or if the condition fails to improve as anticipated.  Other Instructions Please use the link below to get scheduled at one of our urgent cares first thing this morning.  I just want you to get a more detailed examination than what I can provide through video visit, so we can differentiate between a trigeminal neuralgia and shingles, etc., so the proper treatment can be given in so we can make sure no further work-up is needed.  If anything acutely worsens or you develop any vision changes, increased dizziness, etc., you need to be evaluated at the nearest emergency room.   If you have been instructed to have an in-person evaluation today at a local Urgent Care facility, please use the link below. It will take you to a list of all of our available [facility], including address, phone number and hours of  operation. Please do not delay care.  [facility]  If you or a family member do not have a primary care provider, use the link below to schedule a visit and establish care. When you choose a [facility] primary care physician or advanced practice provider, you gain a long-term partner in health. Find a Primary Care Provider  Learn more about [facility] in-office and virtual care options: [facility]

## 2021-03-08 NOTE — Progress Notes (Signed)
Established Patient Office Visit  Subjective:  Patient ID: Candice Pratt, female    DOB: [birth year]  Age: 56 y.o. MRN: [id number]  CC:  Chief Complaint  Patient presents with   Dizziness    Patient states she is having some nerve pain on left side of her face when making sudden movement. Patient is having some slight dizzy spells only when looking to the side quickly    HPI Candice Pratt presents for dizziness, L side face pain  Onset yesterday. L side of face burning - skin feels like it's burning, also having deeper nerve pain sensation.  Notes with L side turn of head, has "jolt" "shiver" and gets dizziness with turning.  Pain with things like washing face, brushing teeth.   No new headaches, visual changes, nvd, fevers, chills, fatigue, rash, hair loss, sick contacts Notes a lot of stress at work. Neck tightness.   Past Medical History:  Diagnosis Date   Asthma    Bronchitis, chronic (HCC)    GERD (gastroesophageal reflux disease)    Hiatal hernia    Spondylolisthesis of lumbar region    Stress incontinence    Wears contact lenses     Past Surgical History:  Procedure Laterality Date   ESOPHAGOGASTRODUODENOSCOPY     KNEE SURGERY     SPINE SURGERY N/A    [employer] 09/23/2019    Family History  Problem Relation Age of Onset   Diabetes Other    Cancer Other    Cancer Mother    Diabetes Brother    Hypertension Brother    Stroke Maternal Grandmother     Social History   Socioeconomic History   Marital status: Married    Spouse name: Not on file   Number of children: 3   Years of education: Not on file   Highest education level: Not on file  Occupational History    Employer: [employer]  Tobacco Use   Smoking status: Every Day    Packs/day: 0.50    Years: 20.00    Pack years: 10.00    Types: Cigarettes   Smokeless tobacco: Never  Vaping Use   Vaping Use: Never used  Substance and Sexual Activity   Alcohol use: Yes    Alcohol/week: 1.0  standard drink    Types: 1 Glasses of wine per week   Drug use: No   Sexual activity: Yes    Birth control/protection: I.U.D.  Other Topics Concern   Not on file  Social History Narrative   Not on file   Social Determinants of Health   Financial Resource Strain: Not on file  Food Insecurity: Not on file  Transportation Needs: Not on file  Physical Activity: Not on file  Stress: Not on file  Social Connections: Not on file  Intimate Partner Violence: Not on file    Outpatient Medications Prior to Visit  Medication Sig Dispense Refill   acetaminophen (TYLENOL) 500 MG tablet Take 1,000 mg by mouth every 4 (four) hours as needed.      acetic acid-hydrocortisone (VOSOL-HC) OTIC solution Place 3 drops into both ears 3 (three) times daily. 10 mL 2   calcium carbonate (TUMS - DOSED IN MG ELEMENTAL CALCIUM) 500 MG chewable tablet Chew 1 tablet by mouth daily.     levonorgestrel (MIRENA) 20 MCG/24HR IUD 1 each by Intrauterine route once.      methocarbamol (ROBAXIN-750) 750 MG tablet Take 1 tablet (750 mg total) by mouth every 6 (six) hours as needed  for muscle spasms. 60 tablet 1   omeprazole (PRILOSEC) 20 MG capsule Take 1 capsule (20 mg total) by mouth daily. 30 capsule 1   ondansetron (ZOFRAN ODT) 4 MG disintegrating tablet Take 1 tablet (4 mg total) by mouth every 8 (eight) hours as needed for nausea or vomiting. (Patient not taking: Reported on 03/08/2021) 20 tablet 1   No facility-administered medications prior to visit.    Allergies  Allergen Reactions   Other Rash and Swelling   Beeswax Swelling    When applied topically   Chlorhexidine Gluconate Itching    ROS Review of Systems  Constitutional: Negative.   HENT: Negative.    Eyes: Negative.   Respiratory: Negative.    Cardiovascular: Negative.   Gastrointestinal: Negative.   Genitourinary: Negative.   Musculoskeletal: Negative.   Skin: Negative.   Neurological: Negative.   Psychiatric/Behavioral: Negative.    All  other systems reviewed and are negative.    Objective:    Physical Exam Vitals and nursing note reviewed.  Constitutional:      General: She is not in acute distress.    Appearance: Normal appearance. She is normal weight. She is not ill-appearing, toxic-appearing or diaphoretic.  Eyes:     General: No visual field deficit. Cardiovascular:     Rate and Rhythm: Normal rate and regular rhythm.     Heart sounds: Normal heart sounds. No murmur heard.   No friction rub. No gallop.  Pulmonary:     Effort: Pulmonary effort is normal. No respiratory distress.     Breath sounds: Normal breath sounds. No stridor. No wheezing, rhonchi or rales.  Chest:     Chest wall: No tenderness.  Skin:    General: Skin is warm and dry.  Neurological:     General: No focal deficit present.     Mental Status: She is alert and oriented to person, place, and time. Mental status is at baseline.     GCS: GCS eye subscore is 4. GCS verbal subscore is 5. GCS motor subscore is 6.     Cranial Nerves: Cranial nerves 2-12 are intact. No cranial nerve deficit or facial asymmetry.     Sensory: Sensation is intact. No sensory deficit.     Motor: Motor function is intact. No weakness, tremor, atrophy, abnormal muscle tone, seizure activity or pronator drift.     Coordination: Coordination is intact. Romberg sign negative. Coordination normal. Finger-Nose-Finger Test and Heel to [facility] Test normal. Rapid alternating movements normal.     Gait: Gait normal.     Deep Tendon Reflexes: Reflexes normal.  Psychiatric:        Mood and Affect: Mood normal.        Behavior: Behavior normal.        Thought Content: Thought content normal.        Judgment: Judgment normal.    BP (!) 148/64    Pulse 84    Temp 98 F (36.7 C) (Temporal)    Resp 18    Ht 5\' 11"  (1.803 m)    Wt 207 lb 6.4 oz (94.1 kg)    SpO2 100%    BMI 28.93 kg/m  Wt Readings from Last 3 Encounters:  03/08/21 207 lb 6.4 oz (94.1 kg)  10/27/19 199 lb 6.4 oz  (90.4 kg)  10/15/18 196 lb (88.9 kg)     Health Maintenance Due  Topic Date Due   COVID-19 Vaccine (1) Never done   HIV Screening  Never done   Hepatitis  C Screening  Never done   TETANUS/TDAP  Never done   COLONOSCOPY (Pts 45-35yrs Insurance coverage will need to be confirmed)  Never done   MAMMOGRAM  Never done   Zoster Vaccines- Shingrix (1 of 2) Never done   PAP SMEAR-Modifier  02/05/2019   INFLUENZA VACCINE  09/04/2020    There are no preventive care reminders to display for this patient.  Lab Results  Component Value Date   TSH 2.110 03/31/2009   Lab Results  Component Value Date   WBC 6.4 10/01/2017   HGB 12.6 10/01/2017   HCT 39.7 10/01/2017   MCV 94.1 10/01/2017   PLT 274 10/01/2017   Lab Results  Component Value Date   NA 140 10/01/2017   K 3.7 10/01/2017   CO2 31 10/01/2017   GLUCOSE 83 10/01/2017   BUN 15 10/01/2017   CREATININE 0.92 10/01/2017   BILITOT 0.5 08/31/2015   ALKPHOS 17 (L) 08/31/2015   AST 11 08/31/2015   ALT 9 08/31/2015   PROT 7.2 08/31/2015   ALBUMIN 4.5 08/31/2015   CALCIUM 9.6 10/01/2017   ANIONGAP 7 10/01/2017   No results found for: CHOL No results found for: HDL No results found for: LDLCALC No results found for: TRIG No results found for: CHOLHDL No results found for: HGBA1C    Assessment & Plan:   Problem List Items Addressed This Visit   None Visit Diagnoses     Left-sided face pain    -  Primary   Relevant Medications   indomethacin (INDOCIN) 50 MG capsule   predniSONE (DELTASONE) 20 MG tablet   gabapentin (NEURONTIN) 100 MG capsule   Dizziness       Relevant Medications   indomethacin (INDOCIN) 50 MG capsule   predniSONE (DELTASONE) 20 MG tablet   gabapentin (NEURONTIN) 100 MG capsule       Meds ordered this encounter  Medications   indomethacin (INDOCIN) 50 MG capsule    Sig: Take 1 capsule (50 mg total) by mouth 3 (three) times daily with meals.    Dispense:  45 capsule    Refill:  0    Order  Specific Question:   Supervising Provider    Answer:   Carlota Raspberry, JEFFREY R [[id number]]   predniSONE (DELTASONE) 20 MG tablet    Sig: Take 1 tablet (20 mg total) by mouth daily with breakfast.    Dispense:  5 tablet    Refill:  0    Order Specific Question:   Supervising Provider    Answer:   Carlota Raspberry, JEFFREY R [[id number]]   gabapentin (NEURONTIN) 100 MG capsule    Sig: Take 1 capsule (100 mg total) by mouth 3 (three) times daily.    Dispense:  90 capsule    Refill:  3    Order Specific Question:   Supervising Provider    Answer:   Carlota Raspberry, JEFFREY R [[id number]]    Follow-up: Return if symptoms worsen or fail to improve.   PLAN Reassuring exam. No acute findings on neuro exam Suspect perhaps trigeminal neuralgia involving V1 and V2. Will treat as such Low threshold for ER presentation with symptoms of stroke including thunderclap symptoms, progression of symptoms, facial drooping, unilateral weakness, acute visual changes, and any other concerning symptoms Will check in on patient tomorrow to monitor response to therapy. Patient encouraged to call clinic with any questions, comments, or concerns.  Maximiano Coss, NP

## 2021-03-08 NOTE — Progress Notes (Signed)
Virtual Visit Consent   Candice Pratt, you are scheduled for a virtual visit with a [facility] provider today.     Just as with appointments in the office, your consent must be obtained to participate.  Your consent will be active for this visit and any virtual visit you may have with one of our providers in the next 365 days.     If you have a MyChart account, a copy of this consent can be sent to you electronically.  All virtual visits are billed to your insurance company just like a traditional visit in the office.    As this is a virtual visit, video technology does not allow for your provider to perform a traditional examination.  This may limit your provider's ability to fully assess your condition.  If your provider identifies any concerns that need to be evaluated in person or the need to arrange testing (such as labs, EKG, etc.), we will make arrangements to do so.     Although advances in technology are sophisticated, we cannot ensure that it will always work on either your end or our end.  If the connection with a video visit is poor, the visit may have to be switched to a telephone visit.  With either a video or telephone visit, we are not always able to ensure that we have a secure connection.     I need to obtain your verbal consent now.   Are you willing to proceed with your visit today?    Candice Pratt has provided verbal consent on 03/08/2021 for a virtual visit (video or telephone).   Leeanne Rio, [place]   Date: 03/08/2021 8:22 AM   Virtual Visit via Video Note   I, Leeanne Rio, connected with  Candice Pratt  ([id number], [birth year]) on 03/08/21 at  8:00 AM EST by a video-enabled telemedicine application and verified that I am speaking with the correct person using two identifiers.  Location: Patient: Virtual Visit Location Patient: Home Provider: Virtual Visit Location Provider: Home Office   I discussed the limitations of evaluation and management  by telemedicine and the availability of in person appointments. The patient expressed understanding and agreed to proceed.    History of Present Illness: Candice Pratt is a 56 y.o. who identifies as a female who was assigned female at birth, and is being seen today for around 24 hours of a tingling/electrical pain from the top of her left ear, radiating to the left side of her nose.  Denies any numbness but does note some tingling.  Denies rash.  Notes significant hypersensitivity skin in this area.  Denies history of similar symptoms.  Does note the roof of her mouth seems to be sensitive.  Denies any vision change or decreased extraocular movement.  Notes if she turns her head to the left she will get a sharp pain in her ear along with some dizziness.  Denies symptoms of right ear or with turning head to the right.  Denies fever, chills, aches.  Denies any known recent illness.  Denies any facial weakness or change in speech.  Denies altered mental status.  HPI: HPI  Problems:  Patient Active Problem List   Diagnosis Date Noted   Spondylolysis, lumbar region 10/07/2017   Lymphadenopathy, inguinal 08/13/2017   Lateral epicondylitis of right elbow 01/04/2017   DERMATITIS HERPETIFORMIS 05/08/2009   TOBACCO ABUSE 03/31/2009   HEMORRHOIDS, EXTERNAL 03/31/2009   FATIGUE 03/31/2009    Allergies:  Allergies  Allergen Reactions   Other Rash and Swelling   Beeswax Swelling    When applied topically   Chlorhexidine Gluconate Itching   Medications:  Current Outpatient Medications:    acetaminophen (TYLENOL) 500 MG tablet, Take 1,000 mg by mouth every 4 (four) hours as needed. , Disp: , Rfl:    acetic acid-hydrocortisone (VOSOL-HC) OTIC solution, Place 3 drops into both ears 3 (three) times daily., Disp: 10 mL, Rfl: 2   calcium carbonate (TUMS - DOSED IN MG ELEMENTAL CALCIUM) 500 MG chewable tablet, Chew 1 tablet by mouth daily., Disp: , Rfl:    levonorgestrel (MIRENA) 20 MCG/24HR IUD, 1 each  by Intrauterine route once. , Disp: , Rfl:    methocarbamol (ROBAXIN-750) 750 MG tablet, Take 1 tablet (750 mg total) by mouth every 6 (six) hours as needed for muscle spasms., Disp: 60 tablet, Rfl: 1   omeprazole (PRILOSEC) 20 MG capsule, Take 1 capsule (20 mg total) by mouth daily., Disp: 30 capsule, Rfl: 1   ondansetron (ZOFRAN ODT) 4 MG disintegrating tablet, Take 1 tablet (4 mg total) by mouth every 8 (eight) hours as needed for nausea or vomiting., Disp: 20 tablet, Rfl: 1  Observations/Objective: Patient is well-developed, well-nourished in no acute distress.  Resting comfortably at home.  Head is normocephalic, atraumatic.  No labored breathing. Speech is clear and coherent with logical content.  Patient is alert and oriented at baseline.   Assessment and Plan: 1. Neuralgia  A lot of her symptoms seem most consistent with a trigeminal neuralgia affecting V2, but could also be early shingles.  Giving the dizziness and sharp pain return the head to the left and pain in the ear, I want her evaluated in person for detailed neuro exam and for examination of her left ear to make sure there are no herpetic lesions inside the ear canal.  She is alert and oriented x3 without any alarm signs or symptoms that would prompt need for ER evaluation.  She has been directed to nearest [facility] urgent care for evaluation as she is currently between PCPs.  Strict ER precautions reviewed with patient.  No charge for visit since sending her for in person evaluation. Follow Up Instructions: I discussed the assessment and treatment plan with the patient. The patient was provided an opportunity to ask questions and all were answered. The patient agreed with the plan and demonstrated an understanding of the instructions.  A copy of instructions were sent to the patient via MyChart unless otherwise noted below.   The patient was advised to call back or seek an in-person evaluation if the symptoms worsen or if the  condition fails to improve as anticipated.  Time:  I spent 12 minutes with the patient via telehealth technology discussing the above problems/concerns.    Leeanne Rio, PA-C

## 2021-03-08 NOTE — Patient Instructions (Addendum)
Ms. Demo -  Doristine Devoid to see you  Call if symptoms don't get better.  ER presentation for acute visual changes, thunderclap headache, facial drooping, unilateral weakness, and other urgent concerns.  Thank you,  Rich     If you have lab work done today you will be contacted with your lab results within the next 2 weeks.  If you have not heard from [place] then please contact us. The fastest way to get your results is to register for My Chart.   IF you received an x-ray today, you will receive an invoice from [facility]. Please contact [facility] at [phone] with questions or concerns regarding your invoice.   IF you received labwork today, you will receive an invoice from [facility]. Please contact [facility] at [phone] with questions or concerns regarding your invoice.   Our billing staff will not be able to assist you with questions regarding bills from these companies.  You will be contacted with the lab results as soon as they are available. The fastest way to get your results is to activate your My Chart account. Instructions are located on the last page of this paperwork. If you have not heard from [place] regarding the results in 2 weeks, please contact this office.

## 2021-03-09 ENCOUNTER — Encounter: Payer: Self-pay | Admitting: Registered Nurse

## 2021-04-16 ENCOUNTER — Telehealth: Payer: 59 | Admitting: Physician Assistant

## 2021-04-16 DIAGNOSIS — R3989 Other symptoms and signs involving the genitourinary system: Secondary | ICD-10-CM

## 2021-04-16 MED ORDER — CEPHALEXIN 500 MG PO CAPS: 500.0000 mg | ORAL_CAPSULE | Freq: Two times a day (BID) | ORAL | 0 refills | Status: AC

## 2021-04-16 NOTE — Progress Notes (Signed)
I have spent 5 minutes in review of e-visit questionnaire, review and updating patient chart, medical decision making and response to patient.   Ottie Neglia Cody Kaedin Hicklin, PA-C    

## 2021-04-16 NOTE — Progress Notes (Signed)

## 2021-05-06 ENCOUNTER — Encounter: Payer: Self-pay | Admitting: Family Medicine

## 2021-05-22 DIAGNOSIS — H179 Unspecified corneal scar and opacity: Secondary | ICD-10-CM | POA: Diagnosis not present

## 2021-06-11 DIAGNOSIS — H0102B Squamous blepharitis left eye, upper and lower eyelids: Secondary | ICD-10-CM | POA: Diagnosis not present

## 2021-06-11 DIAGNOSIS — H16423 Pannus (corneal), bilateral: Secondary | ICD-10-CM | POA: Diagnosis not present

## 2021-06-11 DIAGNOSIS — H179 Unspecified corneal scar and opacity: Secondary | ICD-10-CM | POA: Diagnosis not present

## 2021-06-11 DIAGNOSIS — H0102A Squamous blepharitis right eye, upper and lower eyelids: Secondary | ICD-10-CM | POA: Diagnosis not present

## 2021-07-23 DIAGNOSIS — H0102A Squamous blepharitis right eye, upper and lower eyelids: Secondary | ICD-10-CM | POA: Diagnosis not present

## 2021-07-23 DIAGNOSIS — H0102B Squamous blepharitis left eye, upper and lower eyelids: Secondary | ICD-10-CM | POA: Diagnosis not present

## 2021-07-23 DIAGNOSIS — H16423 Pannus (corneal), bilateral: Secondary | ICD-10-CM | POA: Diagnosis not present

## 2021-07-23 DIAGNOSIS — H179 Unspecified corneal scar and opacity: Secondary | ICD-10-CM | POA: Diagnosis not present

## 2021-08-04 ENCOUNTER — Telehealth: Payer: 59 | Admitting: Nurse Practitioner

## 2021-08-04 DIAGNOSIS — L089 Local infection of the skin and subcutaneous tissue, unspecified: Secondary | ICD-10-CM

## 2021-08-04 MED ORDER — SULFAMETHOXAZOLE-TRIMETHOPRIM 800-160 MG PO TABS: 1.0000 | ORAL_TABLET | Freq: Two times a day (BID) | ORAL | 0 refills | Status: AC

## 2021-08-04 NOTE — Progress Notes (Signed)
E Visit for Cellulitis  We are sorry that you are not feeling well. Here is how we plan to help!  Based on what you shared with me it looks like you have cellulitis.  Cellulitis looks like areas of skin redness, swelling, and warmth; it develops as a result of bacteria entering under the skin. Little red spots and/or bleeding can be seen in skin, and tiny surface sacs containing fluid can occur. Fever can be present. Cellulitis is almost always on one side of a body, and the lower limbs are the most common site of involvement.   I have prescribed:  Bactrim DS 1 tablet by mouth twice a day for 7 days  HOME CARE:  Take your medications as ordered and take all of them, even if the skin irritation appears to be healing.   GET HELP RIGHT AWAY IF:  Symptoms that don't begin to go away within 48 hours. Severe redness persists or worsens If the area turns color, spreads or swells. If it blisters and opens, develops yellow-brown crust or bleeds. You develop a fever or chills. If the pain increases or becomes unbearable.  Are unable to keep fluids and food down.  MAKE SURE YOU   Understand these instructions. Will watch your condition. Will get help right away if you are not doing well or get worse.  Thank you for choosing an e-visit.  Your e-visit answers were reviewed by a board certified advanced clinical practitioner to complete your personal care plan. Depending upon the condition, your plan could have included both over the counter or prescription medications.  Please review your pharmacy choice. Make sure the pharmacy is open so you can pick up prescription now. If there is a problem, you may contact your provider through [employer] and have the prescription routed to another pharmacy.  Your safety is important to [place]. If you have drug allergies check your prescription carefully.   For the next 24 hours you can use MyChart to ask questions about today's visit, request a  non-urgent call back, or ask for a work or school excuse. You will get an email in the next two days asking about your experience. I hope that your e-visit has been valuable and will speed your recovery.   I spent approximately 5 minutes reviewing the patient's history, current symptoms and coordinating their plan of care today.    Meds ordered this encounter  Medications   sulfamethoxazole-trimethoprim (BACTRIM DS) 800-160 MG tablet    Sig: Take 1 tablet by mouth 2 (two) times daily for 7 days.    Dispense:  14 tablet    Refill:  0

## 2021-08-05 ENCOUNTER — Ambulatory Visit
Admission: EM | Admit: 2021-08-05 | Discharge: 2021-08-05 | Disposition: A | Discharge status: Home / Self Care | Payer: 59

## 2021-08-05 DIAGNOSIS — L02214 Cutaneous abscess of groin: Secondary | ICD-10-CM | POA: Diagnosis not present

## 2021-08-05 NOTE — Discharge Instructions (Signed)
Continue antibiotic.  Also advise warm compresses to affected area for about 20 minutes at a time 2-3 times daily.  Please follow-up if symptoms persist or worsen.

## 2021-08-05 NOTE — ED Triage Notes (Signed)
Patient presents to Urgent Care with complaints of ingrown hair on her groin on l side since Friday. Patient reports she had an e vist because it grew in size and was prescribed an antibiotic last night and she attempted to drain it herself today and now she is having extreme pain at the site. She thinks it needs to be drained. Pt reports feverish symptoms last night as well

## 2021-08-05 NOTE — ED Provider Notes (Signed)
[facility]    CSN: [id number] Arrival date & time: 08/05/21  1115      History   Chief Complaint Chief Complaint  Patient presents with   skin absess    HPI Candice Pratt is a 56 y.o. female.   Patient presents with concern for abscess/ingrown hair to left groin that she noticed about 3 days ago.  Patient had  e-visit yesterday and was prescribed Bactrim antibiotic.  Reports that she tried to squeeze it yesterday to drain it and was not successful.  She would like it assessed to be drained today.  Denies fever, body aches, chills.     Past Medical History:  Diagnosis Date   Asthma    Bronchitis, chronic (HCC)    GERD (gastroesophageal reflux disease)    Hiatal hernia    Spondylolisthesis of lumbar region    Stress incontinence    Wears contact lenses     Patient Active Problem List   Diagnosis Date Noted   Spondylolysis, lumbar region 10/07/2017   Lymphadenopathy, inguinal 08/13/2017   Lateral epicondylitis of right elbow 01/04/2017   DERMATITIS HERPETIFORMIS 05/08/2009   TOBACCO ABUSE 03/31/2009   HEMORRHOIDS, EXTERNAL 03/31/2009   FATIGUE 03/31/2009    Past Surgical History:  Procedure Laterality Date   ESOPHAGOGASTRODUODENOSCOPY     KNEE SURGERY     SPINE SURGERY N/A    [employer] 09/23/2019    OB History   No obstetric history on file.      Home Medications    Prior to Admission medications   Medication Sig Start Date End Date Taking? Authorizing Provider  acetaminophen (TYLENOL) 500 MG tablet Take 1,000 mg by mouth every 4 (four) hours as needed.     [provider]  acetic acid-hydrocortisone (VOSOL-HC) OTIC solution Place 3 drops into both ears 3 (three) times daily. 10/27/19   Maximiano Coss, NP  calcium carbonate (TUMS - DOSED IN MG ELEMENTAL CALCIUM) 500 MG chewable tablet Chew 1 tablet by mouth daily.    [provider]  gabapentin (NEURONTIN) 100 MG capsule Take 1 capsule (100 mg total) by mouth 3 (three)  times daily. 03/08/21   Maximiano Coss, NP  indomethacin (INDOCIN) 50 MG capsule Take 1 capsule (50 mg total) by mouth 3 (three) times daily with meals. 03/08/21   Maximiano Coss, NP  levonorgestrel (MIRENA) 20 MCG/24HR IUD 1 each by Intrauterine route once.     [provider]  methocarbamol (ROBAXIN-750) 750 MG tablet Take 1 tablet (750 mg total) by mouth every 6 (six) hours as needed for muscle spasms. 10/27/19   Maximiano Coss, NP  omeprazole (PRILOSEC) 20 MG capsule Take 1 capsule (20 mg total) by mouth daily. 05/27/18   Wendie Agreste, MD  ondansetron (ZOFRAN ODT) 4 MG disintegrating tablet Take 1 tablet (4 mg total) by mouth every 8 (eight) hours as needed for nausea or vomiting. Patient not taking: Reported on 03/08/2021 09/24/19   Posey Boyer, MD  sulfamethoxazole-trimethoprim (BACTRIM DS) 800-160 MG tablet Take 1 tablet by mouth 2 (two) times daily for 7 days. 08/04/21 08/11/21  Apolonio Schneiders, FNP    Family History Family History  Problem Relation Age of Onset   Diabetes Other    Cancer Other    Cancer Mother    Diabetes Brother    Hypertension Brother    Stroke Maternal Grandmother     Social History Social History   Tobacco Use   Smoking status: Every Day    Packs/day: 0.50  Years: 20.00    Total pack years: 10.00    Types: Cigarettes   Smokeless tobacco: Never  Vaping Use   Vaping Use: Never used  Substance Use Topics   Alcohol use: Yes    Alcohol/week: 1.0 standard drink of alcohol    Types: 1 Glasses of wine per week   Drug use: No     Allergies   Beeswax and Chlorhexidine gluconate   Review of Systems Review of Systems Per HPI  Physical Exam Triage Vital Signs ED Triage Vitals  Enc Vitals Group     BP 08/05/21 1220 (!) 152/87     Pulse Rate 08/05/21 1220 71     Resp 08/05/21 1220 18     Temp 08/05/21 1220 98 F (36.7 C)     Temp src --      SpO2 08/05/21 1220 98 %     Weight --      Height --      Head Circumference --      Peak  Flow --      Pain Score 08/05/21 1219 6     Pain Loc --      Pain Edu? --      Excl. in [place]? --    No data found.  Updated Vital Signs BP (!) 152/87   Pulse 71   Temp 98 F (36.7 C)   Resp 18   SpO2 98%   Visual Acuity Right Eye Distance:   Left Eye Distance:   Bilateral Distance:    Right Eye Near:   Left Eye Near:    Bilateral Near:     Physical Exam Exam conducted with a chaperone present.  Constitutional:      General: She is not in acute distress.    Appearance: Normal appearance. She is not toxic-appearing or diaphoretic.  HENT:     Head: Normocephalic and atraumatic.  Eyes:     Extraocular Movements: Extraocular movements intact.     Conjunctiva/sclera: Conjunctivae normal.  Pulmonary:     Effort: Pulmonary effort is normal.  Skin:    Comments: Approximately 0.5 cm indurated abscess with no drainage present to left groin.  Neurological:     General: No focal deficit present.     Mental Status: She is alert and oriented to person, place, and time. Mental status is at baseline.  Psychiatric:        Mood and Affect: Mood normal.        Behavior: Behavior normal.        Thought Content: Thought content normal.        Judgment: Judgment normal.      UC Treatments / Results  Labs (all labs ordered are listed, but only abnormal results are displayed) Labs Reviewed - No data to display  EKG   Radiology No results found.  Procedures Procedures (including critical care time)  Medications Ordered in UC Medications - No data to display  Initial Impression / Assessment and Plan / UC Course  I have reviewed the triage vital signs and the nursing notes.  Pertinent labs & imaging results that were available during my care of the patient were reviewed by me and considered in my medical decision making (see chart for details).     Abscess to left groin with induration.  Do not think that I&D is necessary given that it is indurated and not conducive to  drainage.  Advised warm compresses and continuing antibiotic.  Patient was given strict return precautions.  Patient verbalized understanding and was agreeable with plan. Final Clinical Impressions(s) / UC Diagnoses   Final diagnoses:  Abscess, groin     Discharge Instructions      Continue antibiotic.  Also advise warm compresses to affected area for about 20 minutes at a time 2-3 times daily.  Please follow-up if symptoms persist or worsen.    ED Prescriptions   None    PDMP not reviewed this encounter.   Teodora Medici, [place] 08/05/21 1245

## 2021-09-05 ENCOUNTER — Encounter: Payer: Self-pay | Admitting: Family Medicine

## 2021-09-05 ENCOUNTER — Ambulatory Visit: Payer: 59 | Admitting: Family Medicine

## 2021-09-05 VITALS — BP 124/82 | HR 82 | Temp 98.3°F | Resp 20 | Ht 71.0 in | Wt 210.2 lb

## 2021-09-05 DIAGNOSIS — R635 Abnormal weight gain: Secondary | ICD-10-CM

## 2021-09-05 DIAGNOSIS — R197 Diarrhea, unspecified: Secondary | ICD-10-CM

## 2021-09-05 DIAGNOSIS — R0982 Postnasal drip: Secondary | ICD-10-CM

## 2021-09-05 DIAGNOSIS — N951 Menopausal and female climacteric states: Secondary | ICD-10-CM | POA: Diagnosis not present

## 2021-09-05 DIAGNOSIS — R1110 Vomiting, unspecified: Secondary | ICD-10-CM

## 2021-09-05 DIAGNOSIS — M7989 Other specified soft tissue disorders: Secondary | ICD-10-CM | POA: Diagnosis not present

## 2021-09-05 DIAGNOSIS — R059 Cough, unspecified: Secondary | ICD-10-CM | POA: Diagnosis not present

## 2021-09-05 DIAGNOSIS — Z716 Tobacco abuse counseling: Secondary | ICD-10-CM

## 2021-09-05 DIAGNOSIS — J3489 Other specified disorders of nose and nasal sinuses: Secondary | ICD-10-CM | POA: Diagnosis not present

## 2021-09-05 DIAGNOSIS — R600 Localized edema: Secondary | ICD-10-CM | POA: Diagnosis not present

## 2021-09-05 DIAGNOSIS — K219 Gastro-esophageal reflux disease without esophagitis: Secondary | ICD-10-CM

## 2021-09-05 DIAGNOSIS — F439 Reaction to severe stress, unspecified: Secondary | ICD-10-CM

## 2021-09-05 DIAGNOSIS — R4589 Other symptoms and signs involving emotional state: Secondary | ICD-10-CM

## 2021-09-05 MED ORDER — IPRATROPIUM BROMIDE 0.06 % NA SOLN: 1.0000 | Freq: Four times a day (QID) | NASAL | 5 refills | Status: DC

## 2021-09-05 MED ORDER — BUPROPION HCL ER (XL) 150 MG PO TB24: 150.0000 mg | ORAL_TABLET | Freq: Every day | ORAL | 1 refills | Status: DC

## 2021-09-05 NOTE — Progress Notes (Signed)
Subjective:  Patient ID: Candice Pratt, female    DOB: [birth year]  Age: 56 y.o. MRN: [id number]  CC:  Chief Complaint  Patient presents with   Foot Swelling    Foot and ankle was swelling for 3 weeks then it stopped, wants to talk about smoking cessation, reflux issues has also had diarrhea since Friday as well    HPI Candice Pratt presents for   Foot/ankle swelling: Past 3 weeks, mostly left leg - at ankles, more in afternoon. Better overnight. Called for this appt, then improved past few weeks.  No cp, dyspnea. No other known swelling.  No recent blood work.   GERD/reflux: No recent heartburn - only few times per year. Has some episodic runny nose, cough, sharp sensation in back of throat, bitter taste in back of throat. Throat/ear itch. Some postnasal drip. Zyrtec daily, TUMS daily - 4 per day - helps cough.  No recent PPI - did not see any change in cough, runny nose or stomach aches - even with higher dose.  Better on vacation.  Stressful job. Has been discussing with therapist though EAP - few phone calls, and has discussed with HR.  62 yo son getting married in 3 weeks.   Diarrhea noted since 6 days ago - very stressful day. 5 episodes initial day, improving - loose stool, but only a few episodes on Saturday and Sunday. Improving, but still loose stools 2 times per day. Treated with pepto bismol 3 times per day initially, not now  Some nausea with eating with meals.  No fever.  Vomiting yesterday afternoon, but chronic regurgitation of clear mucus after meals or if stomach is empty. Same sx's for years. No GI eval in years.  Prozac in 2018 during crisis - worked well.   Smoking cessation: 1/2-1 ppd. No recent attempts at quitting. Motivated to quit to see if will help the other symptoms. Concerned about weight gain. Would like to try wellbutrin - discussed possible anxiety symptoms.  No hx of seizures.   Diarrhea:  Weight gain, hot flushes intermittent past 3 years  and weight gain past 3 years - 25--30 pounds. Plans to discuss further with GYN at end of this month.  Has IUD past 5 years, no menses with IUD, spotting in past, none in 2 years. No vomiting of food.   Worse mood sx's past 2 months. Some depressed mood. No SI No IDU, no regular alcohol use. Few on weekends.     09/05/2021    3:49 PM 03/08/2021   11:32 AM 10/27/2019    3:53 PM 10/27/2019    3:48 PM 10/15/2018    3:53 PM  Depression screen PHQ 2/9  Decreased Interest 1 0 0 0 0  Down, Depressed, Hopeless 2 0 0 0 0  PHQ - 2 Score 3 0 0 0 0  Altered sleeping 1 0     Tired, decreased energy 2 0     Change in appetite 0 0     Feeling bad or failure about yourself  0 0     Trouble concentrating 0 0     Moving slowly or fidgety/restless 0 0     Suicidal thoughts 0 0     PHQ-9 Score 6 0     Difficult doing work/chores  Not difficult at all       History Patient Active Problem List   Diagnosis Date Noted   Spondylolysis, lumbar region 10/07/2017   Lymphadenopathy, inguinal 08/13/2017   Lateral  epicondylitis of right elbow 01/04/2017   DERMATITIS HERPETIFORMIS 05/08/2009   TOBACCO ABUSE 03/31/2009   HEMORRHOIDS, EXTERNAL 03/31/2009   FATIGUE 03/31/2009   Past Medical History:  Diagnosis Date   Asthma    Bronchitis, chronic (HCC)    GERD (gastroesophageal reflux disease)    Hiatal hernia    Spondylolisthesis of lumbar region    Stress incontinence    Wears contact lenses    Past Surgical History:  Procedure Laterality Date   ESOPHAGOGASTRODUODENOSCOPY     KNEE SURGERY     SPINE SURGERY N/A    [employer] 09/23/2019   Allergies  Allergen Reactions   Beeswax Swelling    When applied topically   Chlorhexidine Gluconate Itching   Prior to Admission medications   Medication Sig Start Date End Date Taking? Authorizing Provider  acetaminophen (TYLENOL) 500 MG tablet Take 1,000 mg by mouth every 4 (four) hours as needed.    Yes [provider]  calcium carbonate (TUMS -  DOSED IN MG ELEMENTAL CALCIUM) 500 MG chewable tablet Chew 1 tablet by mouth daily.   Yes [provider]  levonorgestrel (MIRENA) 20 MCG/24HR IUD 1 each by Intrauterine route once.    Yes [provider]  methocarbamol (ROBAXIN-750) 750 MG tablet Take 1 tablet (750 mg total) by mouth every 6 (six) hours as needed for muscle spasms. 10/27/19  Yes Maximiano Coss, NP  acetic acid-hydrocortisone (VOSOL-HC) OTIC solution Place 3 drops into both ears 3 (three) times daily. 10/27/19   Maximiano Coss, NP  gabapentin (NEURONTIN) 100 MG capsule Take 1 capsule (100 mg total) by mouth 3 (three) times daily. 03/08/21   Maximiano Coss, NP  indomethacin (INDOCIN) 50 MG capsule Take 1 capsule (50 mg total) by mouth 3 (three) times daily with meals. 03/08/21   Maximiano Coss, NP  omeprazole (PRILOSEC) 20 MG capsule Take 1 capsule (20 mg total) by mouth daily. 05/27/18   Wendie Agreste, MD  ondansetron (ZOFRAN ODT) 4 MG disintegrating tablet Take 1 tablet (4 mg total) by mouth every 8 (eight) hours as needed for nausea or vomiting. Patient not taking: Reported on 03/08/2021 09/24/19   Posey Boyer, MD   Social History   Socioeconomic History   Marital status: Married    Spouse name: Not on file   Number of children: 3   Years of education: Not on file   Highest education level: Not on file  Occupational History    Employer: [employer]  Tobacco Use   Smoking status: Every Day    Packs/day: 0.50    Years: 20.00    Total pack years: 10.00    Types: Cigarettes   Smokeless tobacco: Never  Vaping Use   Vaping Use: Never used  Substance and Sexual Activity   Alcohol use: Yes    Alcohol/week: 1.0 standard drink of alcohol    Types: 1 Glasses of wine per week   Drug use: No   Sexual activity: Yes    Birth control/protection: I.U.D.  Other Topics Concern   Not on file  Social History Narrative   Not on file   Social Determinants of Health   Financial Resource Strain: Not on file   Food Insecurity: Not on file  Transportation Needs: Not on file  Physical Activity: Not on file  Stress: Not on file  Social Connections: Not on file  Intimate Partner Violence: Not on file    Review of Systems Per HPI.   Objective:   Vitals:   09/05/21  1554  BP: 124/82  Pulse: 82  Resp: 20  Temp: 98.3 F (36.8 C)  SpO2: 96%  Weight: 210 lb 3.2 oz (95.3 kg)  Height: _0  (1.803 m)     Physical Exam Vitals reviewed.  Constitutional:      General: She is not in acute distress.    Appearance: Normal appearance. She is well-developed. She is not ill-appearing, toxic-appearing or diaphoretic.  HENT:     Head: Normocephalic and atraumatic.  Eyes:     Conjunctiva/sclera: Conjunctivae normal.     Pupils: Pupils are equal, round, and reactive to light.  Neck:     Vascular: No carotid bruit.  Cardiovascular:     Rate and Rhythm: Normal rate and regular rhythm.     Heart sounds: Normal heart sounds.  Pulmonary:     Effort: Pulmonary effort is normal.     Breath sounds: Normal breath sounds.  Abdominal:     Palpations: Abdomen is soft. There is no pulsatile mass.     Tenderness: There is no abdominal tenderness.  Musculoskeletal:     Right lower leg: No edema.     Left lower leg: No edema.  Skin:    General: Skin is warm and dry.  Neurological:     Mental Status: She is alert and oriented to person, place, and time.  Psychiatric:        Attention and Perception: Attention normal.        Mood and Affect: Mood is anxious (at times during visit.).        Speech: Speech normal.        Behavior: Behavior normal. Behavior is cooperative.        Thought Content: Thought content is not paranoid. Thought content does not include homicidal or suicidal ideation.     Comments: Does not appear to be responding to internal stimuli.     55 minutes spent during visit, including chart review, counseling and assimilation of information, discussion of multiple concerns above  including stressors, weight changes, cough and different possible contributors, exam, discussion of plan, and chart completion.     Assessment & Plan:  Candice Pratt is a 56 y.o. female . Pedal edema  -Currently improved.  Check CBC, TSH, BMP, RTC precautions if worsening.  Does not appear to have signs of fluid overload at this time.  Cough, unspecified type - Plan: ipratropium (ATROVENT) 0.06 % nasal spray Gastric reflux - Plan: Ambulatory referral to Gastroenterology Rhinorrhea - Plan: ipratropium (ATROVENT) 0.06 % nasal spray Postnasal drip - Plan: ipratropium (ATROVENT) 0.06 % nasal spray  -Cough may be component of upper airway symptoms, including combination of reflux, silent reflux, and postnasal drip.  Minimal change with PPI.  Trial of Pepcid/H2 blocker and Atrovent nasal spray, refer to gastroenterology.  RTC/ER precautions  Leg swelling  -As above  Weight gain Hot flushes, perimenopausal - Plan: CBC, Basic metabolic panel, TSH, FSH/LH  -Possible early menopause, check labs above for other secondary causes.  We will discuss further at follow-up visit, but also has follow-up with GYN planned.  Diarrhea, unspecified type Recurrent vomiting - Plan: Ambulatory referral to Gastroenterology  -H2 blocker for possible reflux component.  May have component of IBS as diarrhea seem to been associated with stressful events.  Refer to gastroenterology, but also see info below on stress/mood symptoms.  Encounter for smoking cessation counseling - Plan: buPROPion (WELLBUTRIN XL) 150 MG 24 hr tablet Depressed mood - Plan: buPROPion (WELLBUTRIN XL) 150 MG 24 hr  tablet Situational stress - Plan: buPROPion (WELLBUTRIN XL) 150 MG 24 hr tablet  -Situational stressors and based on discussion I suspect there is a component of depression versus anxiety in addition to adjustment component/stress with work.  She did have significant improvement in symptoms previously with a situational stressor on  SSRI.  Those were discussed today, but she would also like to quit smoking and asked about Wellbutrin.  Potential worsening of anxiety was discussed and RTC precautions, stop that medication if that were to occur.  Understanding expressed.  Timing of smoking cessation discussed with current stressors but will start Wellbutrin 150 mg daily for now.  Meds ordered this encounter  Medications   ipratropium (ATROVENT) 0.06 % nasal spray    Sig: Place 1-2 sprays into both nostrils 4 (four) times daily. As needed for nasal congestion    Dispense:  15 mL    Refill:  5   buPROPion (WELLBUTRIN XL) 150 MG 24 hr tablet    Sig: Take 1 tablet (150 mg total) by mouth daily.    Dispense:  30 tablet    Refill:  1   Patient Instructions  I will check some labs for the swelling and weight gain. Keep follow up with OBGYN as planned.   For the heartburn and runny nose symptoms: try pepcid or zantac daily for possible reflux, and ipratropium nasal spray as needed for nasal symptoms.  I will refer you to gastroenterology.   Continue meeting with therapist, but if continued stress or persistent anxiety symptoms we may need to  consider other medication. Wellbutrin may help, but if any worsening anxiety - stop that med and let me know. We may need to change to prozac. I will let you decide on timing of quitting smoking, but see info below.   Recheck in 1 month. Return to the clinic or go to the nearest emergency room if any of your symptoms worsen or new symptoms occur. Hang in there.   Steps to Quit Smoking Smoking tobacco is the leading cause of preventable death. It can affect almost every organ in the body. Smoking puts you and those around you at risk for developing many serious chronic diseases. Quitting smoking can be very challenging. Do not get discouraged if you are not successful the first time. Some people need to make many attempts to quit before they achieve long-term success. Do your best to stick to  your quit plan, and talk with your health care provider if you have any questions or concerns. How do I get ready to quit? When you decide to quit smoking, create a plan to help you succeed. Before you quit: Pick a date to quit. Set a date within the next 2 weeks to give you time to prepare. Write down the reasons why you are quitting. Keep this list in places where you will see it often. Tell your family, friends, and co-workers that you are quitting. Support from people you are close to can make quitting easier. Talk with your health care provider about your options for quitting smoking. Find out what treatment options are covered by your health insurance. Identify people, places, things, and activities that make you want to smoke (triggers). Avoid them. What first steps can I take to quit smoking? Throw away all cigarettes at home, at work, and in your car. Throw away smoking accessories, such as Scientist, research (medical). Clean your car. Make sure to empty the ashtray. Clean your home, including curtains and carpets. What strategies  can I use to quit smoking? Talk with your health care provider about combining strategies, such as taking medicines while you are also receiving in-person counseling. Using these two strategies together makes you more likely to succeed in quitting than if you used either strategy on its own. If you are pregnant or breastfeeding, talk with your health care provider about finding counseling or other support strategies to quit smoking. Do not take medicine to help you quit smoking unless your health care provider tells you to. Quit right away Quit smoking completely, instead of gradually reducing how much you smoke over a period of time. Stopping smoking right away may be more successful than gradually quitting. Attend in-person counseling to help you build problem-solving skills. You are more likely to succeed in quitting if you attend counseling sessions regularly. Even  short sessions of 10 minutes can be effective. Take medicine You may take medicines to help you quit smoking. Some medicines require a prescription. You can also purchase over-the-counter medicines. Medicines may have nicotine in them to replace the nicotine in cigarettes. Medicines may: Help to stop cravings. Help to relieve withdrawal symptoms. Your health care provider may recommend: Nicotine patches, gum, or lozenges. Nicotine inhalers or sprays. Non-nicotine medicine that you take by mouth. Find resources Find resources and support systems that can help you quit smoking and remain smoke-free after you quit. These resources are most helpful when you use them often. They include: Online chats with a Social worker. Telephone quitlines. Printed Furniture conservator/restorer. Support groups or group counseling. Text messaging programs. Mobile phone apps or applications. Use apps that can help you stick to your quit plan by providing reminders, tips, and encouragement. Examples of free services include Quit Guide from the CDC and smokefree.gov  What can I do to make it easier to quit?  Reach out to your family and friends for support and encouragement. Call telephone quitlines, such as 1-800-QUIT-NOW, reach out to support groups, or work with a counselor for support. Ask people who smoke to avoid smoking around you. Avoid places that trigger you to smoke, such as bars, parties, or smoke-break areas at work. Spend time with people who do not smoke. Lessen the stress in your life. Stress can be a smoking trigger for some people. To lessen stress, try: Exercising regularly. Doing deep-breathing exercises. Doing yoga. Meditating. What benefits will I see if I quit smoking? Over time, you should start to see positive results, such as: Improved sense of smell and taste. Decreased coughing and sore throat. Slower heart rate. Lower blood pressure. Clearer and healthier skin. The ability to breathe more  easily. Fewer sick days. Summary Quitting smoking can be very challenging. Do not get discouraged if you are not successful the first time. Some people need to make many attempts to quit before they achieve long-term success. When you decide to quit smoking, create a plan to help you succeed. Quit smoking right away, not slowly over a period of time. Find resources and support systems that can help you quit smoking and remain smoke-free after you quit. This information is not intended to replace advice given to you by your health care provider. Make sure you discuss any questions you have with your health care provider. Document Revised: 01/12/2021 Document Reviewed: 01/12/2021 Elsevier Patient Education  [place], Adult Feeling a certain amount of stress is normal. Stress helps our body and mind get ready to deal with the demands of life. Stress hormones can motivate  you to do well at work and meet your responsibilities. But severe or long-term (chronic) stress can affect your mental and physical health. Chronic stress puts you at higher risk for: Anxiety and depression. Other health problems such as digestive problems, muscle aches, heart disease, high blood pressure, and stroke. What are the causes? Common causes of stress include: Demands from work, such as deadlines, feeling overworked, or having long hours. Pressures at home, such as money issues, disagreements with a spouse, or parenting issues. Pressures from major life changes, such as divorce, moving, loss of a loved one, or chronic illness. You may be at higher risk for stress-related problems if you: Do not get enough sleep. Are in poor health. Do not have emotional support. Have a mental health disorder such as anxiety or depression. How to recognize stress Stress can make you: Have trouble sleeping. Feel sad, anxious, irritable, or overwhelmed. Lose your appetite. Overeat or want to eat  unhealthy foods. Want to use drugs or alcohol. Stress can also cause physical symptoms, such as: Sore, tense muscles, especially in the shoulders and neck. Headaches. Trouble breathing. A faster heart rate. Stomach pain, nausea, or vomiting. Diarrhea or constipation. Trouble concentrating. Follow these instructions at home: Eating and drinking Eat a healthy diet. This includes: Eating foods that are high in fiber, such as beans, whole grains, and fresh fruits and vegetables. Limiting foods that are high in fat and processed sugars, such as fried or sweet foods. Do not skip meals or overeat. Drink enough fluid to keep your urine pale yellow. Alcohol use Do not drink alcohol if: Your health care provider tells you not to drink. You are pregnant, may be pregnant, or are planning to become pregnant. Drinking alcohol is a way some people try to ease their stress. This can be dangerous, so if you drink alcohol: Limit how much you have to: 0-1 drink a day for women. 0-2 drinks a day for men. Know how much alcohol is in your drink. In the [place] one drink equals one 12 oz bottle of beer (355 mL), one 5 oz glass of wine (148 mL), or one 1 oz glass of hard liquor (44 mL). Activity  Include 30 minutes of exercise in your daily schedule. Exercise is a good stress reducer. Include time in your day for an activity that you find relaxing. Try taking a walk, going on a bike ride, reading a book, or listening to music. Schedule your time in a way that lowers stress, and keep a regular schedule. Focus on doing what is most important to get done. Lifestyle Identify the source of your stress and your reaction to it. See a therapist who can help you change unhelpful reactions. When there are stressful events: Talk about them with family, friends, or coworkers. Try to think realistically about stressful events and not ignore them or overreact. Try to find the positives in a stressful situation and not  focus on the negatives. Cut back on responsibilities at work and home, if possible. Ask for help from friends or family members if you need it. Find ways to manage stress, such as: Mindfulness, meditation, or deep breathing. Yoga or tai chi. Progressive muscle relaxation. Spending time in nature. Doing art, playing music, or reading. Making time for fun activities. Spending time with family and friends. Get support from family, friends, or spiritual resources. General instructions Get enough sleep. Try to go to sleep and get up at about the same time every day. Take over-the-counter and  prescription medicines only as told by your health care provider. Do not use any products that contain nicotine or tobacco. These products include cigarettes, chewing tobacco, and vaping devices, such as e-cigarettes. If you need help quitting, ask your health care provider. Do not use drugs or smoke to deal with stress. Keep all follow-up visits. This is important. Where to find support Talk with your health care provider about stress management or finding a support group. Find a therapist to work with you on your stress management techniques. Where to find more information [employer] on Mental Illness: www.nami.org American Psychological Association: TVStereos.ch Contact a health care provider if: Your stress symptoms get worse. You are unable to manage your stress at home. You are struggling to stop using drugs or alcohol. Get help right away if: You may be a danger to yourself or others. You have any thoughts of death or suicide. Get help right awayif you feel like you may hurt yourself or others, or have thoughts about taking your own life. Go to your nearest emergency room or: Call 911. Call the [facility] at (929)033-9851 or 988 in the [place] This is open 24 hours a day. Text the Crisis Text Line at 425-165-8028. Summary Feeling a certain amount of stress is normal,  but severe or long-term (chronic) stress can affect your mental and physical health. Chronic stress can put you at higher risk for anxiety, depression, and other health problems such as digestive problems, muscle aches, heart disease, high blood pressure, and stroke. You may be at higher risk for stress-related problems if you do not get enough sleep, are in poor health, lack emotional support, or have a mental health disorder such as anxiety or depression. Identify the source of your stress and your reaction to it. Try talking about stressful events with family, friends, or coworkers, finding a coping method, or getting support from spiritual resources. If you need more help, talk with your health care provider about finding a support group or a mental health therapist. This information is not intended to replace advice given to you by your health care provider. Make sure you discuss any questions you have with your health care provider. Document Revised: 08/17/2020 Document Reviewed: 08/15/2020 Elsevier Patient Education  [place],   Merri Ray, MD [place] 09/05/21 4:31 PM

## 2021-09-05 NOTE — Patient Instructions (Addendum)
I will check some labs for the swelling and weight gain. Keep follow up with OBGYN as planned.   For the heartburn and runny nose symptoms: try pepcid or zantac daily for possible reflux, and ipratropium nasal spray as needed for nasal symptoms.  I will refer you to gastroenterology.   Continue meeting with therapist, but if continued stress or persistent anxiety symptoms we may need to  consider other medication. Wellbutrin may help, but if any worsening anxiety - stop that med and let me know. We may need to change to prozac. I will let you decide on timing of quitting smoking, but see info below.   Recheck in 1 month. Return to the clinic or go to the nearest emergency room if any of your symptoms worsen or new symptoms occur. Hang in there.   Steps to Quit Smoking Smoking tobacco is the leading cause of preventable death. It can affect almost every organ in the body. Smoking puts you and those around you at risk for developing many serious chronic diseases. Quitting smoking can be very challenging. Do not get discouraged if you are not successful the first time. Some people need to make many attempts to quit before they achieve long-term success. Do your best to stick to your quit plan, and talk with your health care provider if you have any questions or concerns. How do I get ready to quit? When you decide to quit smoking, create a plan to help you succeed. Before you quit: Pick a date to quit. Set a date within the next 2 weeks to give you time to prepare. Write down the reasons why you are quitting. Keep this list in places where you will see it often. Tell your family, friends, and co-workers that you are quitting. Support from people you are close to can make quitting easier. Talk with your health care provider about your options for quitting smoking. Find out what treatment options are covered by your health insurance. Identify people, places, things, and activities that make you want to  smoke (triggers). Avoid them. What first steps can I take to quit smoking? Throw away all cigarettes at home, at work, and in your car. Throw away smoking accessories, such as Scientist, research (medical). Clean your car. Make sure to empty the ashtray. Clean your home, including curtains and carpets. What strategies can I use to quit smoking? Talk with your health care provider about combining strategies, such as taking medicines while you are also receiving in-person counseling. Using these two strategies together makes you more likely to succeed in quitting than if you used either strategy on its own. If you are pregnant or breastfeeding, talk with your health care provider about finding counseling or other support strategies to quit smoking. Do not take medicine to help you quit smoking unless your health care provider tells you to. Quit right away Quit smoking completely, instead of gradually reducing how much you smoke over a period of time. Stopping smoking right away may be more successful than gradually quitting. Attend in-person counseling to help you build problem-solving skills. You are more likely to succeed in quitting if you attend counseling sessions regularly. Even short sessions of 10 minutes can be effective. Take medicine You may take medicines to help you quit smoking. Some medicines require a prescription. You can also purchase over-the-counter medicines. Medicines may have nicotine in them to replace the nicotine in cigarettes. Medicines may: Help to stop cravings. Help to relieve withdrawal symptoms. Your health care  provider may recommend: Nicotine patches, gum, or lozenges. Nicotine inhalers or sprays. Non-nicotine medicine that you take by mouth. Find resources Find resources and support systems that can help you quit smoking and remain smoke-free after you quit. These resources are most helpful when you use them often. They include: Online chats with a Social worker. Telephone  quitlines. Printed Furniture conservator/restorer. Support groups or group counseling. Text messaging programs. Mobile phone apps or applications. Use apps that can help you stick to your quit plan by providing reminders, tips, and encouragement. Examples of free services include Quit Guide from the CDC and smokefree.gov  What can I do to make it easier to quit?  Reach out to your family and friends for support and encouragement. Call telephone quitlines, such as 1-800-QUIT-NOW, reach out to support groups, or work with a counselor for support. Ask people who smoke to avoid smoking around you. Avoid places that trigger you to smoke, such as bars, parties, or smoke-break areas at work. Spend time with people who do not smoke. Lessen the stress in your life. Stress can be a smoking trigger for some people. To lessen stress, try: Exercising regularly. Doing deep-breathing exercises. Doing yoga. Meditating. What benefits will I see if I quit smoking? Over time, you should start to see positive results, such as: Improved sense of smell and taste. Decreased coughing and sore throat. Slower heart rate. Lower blood pressure. Clearer and healthier skin. The ability to breathe more easily. Fewer sick days. Summary Quitting smoking can be very challenging. Do not get discouraged if you are not successful the first time. Some people need to make many attempts to quit before they achieve long-term success. When you decide to quit smoking, create a plan to help you succeed. Quit smoking right away, not slowly over a period of time. Find resources and support systems that can help you quit smoking and remain smoke-free after you quit. This information is not intended to replace advice given to you by your health care provider. Make sure you discuss any questions you have with your health care provider. Document Revised: 01/12/2021 Document Reviewed: 01/12/2021 Elsevier Patient Education  [place], Adult Feeling a certain amount of stress is normal. Stress helps our body and mind get ready to deal with the demands of life. Stress hormones can motivate you to do well at work and meet your responsibilities. But severe or long-term (chronic) stress can affect your mental and physical health. Chronic stress puts you at higher risk for: Anxiety and depression. Other health problems such as digestive problems, muscle aches, heart disease, high blood pressure, and stroke. What are the causes? Common causes of stress include: Demands from work, such as deadlines, feeling overworked, or having long hours. Pressures at home, such as money issues, disagreements with a spouse, or parenting issues. Pressures from major life changes, such as divorce, moving, loss of a loved one, or chronic illness. You may be at higher risk for stress-related problems if you: Do not get enough sleep. Are in poor health. Do not have emotional support. Have a mental health disorder such as anxiety or depression. How to recognize stress Stress can make you: Have trouble sleeping. Feel sad, anxious, irritable, or overwhelmed. Lose your appetite. Overeat or want to eat unhealthy foods. Want to use drugs or alcohol. Stress can also cause physical symptoms, such as: Sore, tense muscles, especially in the shoulders and neck. Headaches. Trouble breathing. A faster heart rate. Stomach pain, nausea,  or vomiting. Diarrhea or constipation. Trouble concentrating. Follow these instructions at home: Eating and drinking Eat a healthy diet. This includes: Eating foods that are high in fiber, such as beans, whole grains, and fresh fruits and vegetables. Limiting foods that are high in fat and processed sugars, such as fried or sweet foods. Do not skip meals or overeat. Drink enough fluid to keep your urine pale yellow. Alcohol use Do not drink alcohol if: Your health care provider tells you not  to drink. You are pregnant, may be pregnant, or are planning to become pregnant. Drinking alcohol is a way some people try to ease their stress. This can be dangerous, so if you drink alcohol: Limit how much you have to: 0-1 drink a day for women. 0-2 drinks a day for men. Know how much alcohol is in your drink. In the [place] one drink equals one 12 oz bottle of beer (355 mL), one 5 oz glass of wine (148 mL), or one 1 oz glass of hard liquor (44 mL). Activity  Include 30 minutes of exercise in your daily schedule. Exercise is a good stress reducer. Include time in your day for an activity that you find relaxing. Try taking a walk, going on a bike ride, reading a book, or listening to music. Schedule your time in a way that lowers stress, and keep a regular schedule. Focus on doing what is most important to get done. Lifestyle Identify the source of your stress and your reaction to it. See a therapist who can help you change unhelpful reactions. When there are stressful events: Talk about them with family, friends, or coworkers. Try to think realistically about stressful events and not ignore them or overreact. Try to find the positives in a stressful situation and not focus on the negatives. Cut back on responsibilities at work and home, if possible. Ask for help from friends or family members if you need it. Find ways to manage stress, such as: Mindfulness, meditation, or deep breathing. Yoga or tai chi. Progressive muscle relaxation. Spending time in nature. Doing art, playing music, or reading. Making time for fun activities. Spending time with family and friends. Get support from family, friends, or spiritual resources. General instructions Get enough sleep. Try to go to sleep and get up at about the same time every day. Take over-the-counter and prescription medicines only as told by your health care provider. Do not use any products that contain nicotine or tobacco. These  products include cigarettes, chewing tobacco, and vaping devices, such as e-cigarettes. If you need help quitting, ask your health care provider. Do not use drugs or smoke to deal with stress. Keep all follow-up visits. This is important. Where to find support Talk with your health care provider about stress management or finding a support group. Find a therapist to work with you on your stress management techniques. Where to find more information [employer] on Mental Illness: www.nami.org American Psychological Association: TVStereos.ch Contact a health care provider if: Your stress symptoms get worse. You are unable to manage your stress at home. You are struggling to stop using drugs or alcohol. Get help right away if: You may be a danger to yourself or others. You have any thoughts of death or suicide. Get help right awayif you feel like you may hurt yourself or others, or have thoughts about taking your own life. Go to your nearest emergency room or: Call 911. Call the [facility] at [phone] or 988 in the  [place] This is open 24 hours a day. Text the Crisis Text Line at (229) 446-0569. Summary Feeling a certain amount of stress is normal, but severe or long-term (chronic) stress can affect your mental and physical health. Chronic stress can put you at higher risk for anxiety, depression, and other health problems such as digestive problems, muscle aches, heart disease, high blood pressure, and stroke. You may be at higher risk for stress-related problems if you do not get enough sleep, are in poor health, lack emotional support, or have a mental health disorder such as anxiety or depression. Identify the source of your stress and your reaction to it. Try talking about stressful events with family, friends, or coworkers, finding a coping method, or getting support from spiritual resources. If you need more help, talk with your health care provider about  finding a support group or a mental health therapist. This information is not intended to replace advice given to you by your health care provider. Make sure you discuss any questions you have with your health care provider. Document Revised: 08/17/2020 Document Reviewed: 08/15/2020 Elsevier Patient Education  [place].

## 2021-09-06 LAB — CBC
HCT: 39.8 % (ref 36.0–46.0)
Hemoglobin: 13.4 g/dL (ref 12.0–15.0)
MCHC: 33.7 g/dL (ref 30.0–36.0)
MCV: 89.9 fl (ref 78.0–100.0)
Platelets: 277 10*3/uL (ref 150.0–400.0)
RBC: 4.43 Mil/uL (ref 3.87–5.11)
RDW: 14.5 % (ref 11.5–15.5)
WBC: 4.9 10*3/uL (ref 4.0–10.5)

## 2021-09-06 LAB — BASIC METABOLIC PANEL
BUN: 19 mg/dL (ref 6–23)
CO2: 31 mEq/L (ref 19–32)
Calcium: 10.3 mg/dL (ref 8.4–10.5)
Chloride: 101 mEq/L (ref 96–112)
Creatinine, Ser: 1.26 mg/dL — ABNORMAL HIGH (ref 0.40–1.20)
GFR: 48.6 mL/min — ABNORMAL LOW (ref >60.00)
Glucose, Bld: 88 mg/dL (ref 70–99)
Potassium: 4.7 mEq/L (ref 3.5–5.1)
Sodium: 140 mEq/L (ref 135–145)

## 2021-09-06 LAB — TSH: TSH: 2.76 u[IU]/mL (ref 0.35–5.50)

## 2021-09-06 LAB — FSH/LH
FSH: 44 m[IU]/mL
LH: 32.1 m[IU]/mL

## 2021-09-07 ENCOUNTER — Encounter: Payer: Self-pay | Admitting: Family Medicine

## 2021-09-10 ENCOUNTER — Telehealth: Payer: Self-pay | Admitting: Family Medicine

## 2021-09-10 DIAGNOSIS — F439 Reaction to severe stress, unspecified: Secondary | ICD-10-CM

## 2021-09-10 DIAGNOSIS — R4589 Other symptoms and signs involving emotional state: Secondary | ICD-10-CM

## 2021-09-10 NOTE — Telephone Encounter (Signed)
Caller name: Candice Pratt (pt)  On DPR? :yes/no: Yes  Call back number: [phone]  Provider they see: Dr. Carlota Raspberry  Reason for call: Pt started wellbuttrin 150 mg on Friday 09/07/2021; pt is having increased anxiety at work this morning.  Wondering what she should do.

## 2021-09-11 MED ORDER — FLUOXETINE HCL 20 MG PO CAPS: 20.0000 mg | ORAL_CAPSULE | Freq: Every day | ORAL | 3 refills | Status: DC

## 2021-09-11 NOTE — Telephone Encounter (Signed)
Stop wellbutrin, start fluoxetine. I will send that in. I will send other info to mychart about labs. Thanks.

## 2021-09-11 NOTE — Telephone Encounter (Signed)
Left pt a vm explained to stop the Wellbutrin and start the Fluoxitine

## 2021-09-20 ENCOUNTER — Ambulatory Visit
Admission: EM | Admit: 2021-09-20 | Discharge: 2021-09-20 | Disposition: A | Discharge status: Home / Self Care | Payer: 59 | Attending: Internal Medicine | Admitting: Internal Medicine

## 2021-09-20 DIAGNOSIS — J029 Acute pharyngitis, unspecified: Secondary | ICD-10-CM | POA: Diagnosis not present

## 2021-09-20 DIAGNOSIS — J069 Acute upper respiratory infection, unspecified: Secondary | ICD-10-CM | POA: Diagnosis not present

## 2021-09-20 LAB — POCT RAPID STREP A (OFFICE): Rapid Strep A Screen: NEGATIVE

## 2021-09-20 MED ORDER — ALBUTEROL SULFATE HFA 108 (90 BASE) MCG/ACT IN AERS: 1.0000 | INHALATION_SPRAY | Freq: Four times a day (QID) | RESPIRATORY_TRACT | 0 refills | Status: DC | PRN

## 2021-09-20 NOTE — ED Provider Notes (Signed)
[facility]    CSN: [id number] Arrival date & time: 09/20/21  1034      History   Chief Complaint Chief Complaint  Patient presents with   Sore Throat   Nausea   Cough   Nasal Congestion    HPI Candice Pratt is a 56 y.o. female.   Patient presents with cough, nasal congestion, sore throat, chills, generalized body aches that have been present for about 2 days.  Patient reports that she notified health at work so she has a COVID test pending currently.  She is concerned for strep throat though as well.  She states that she has been traveling a lot lately so she is not sure if she has been exposed to anybody that has been sick.  Denies any known fevers.  Denies chest pain, shortness of breath, ear pain, nausea, vomiting, diarrhea, abdominal pain.  Patient has used Best boy and Tylenol with some improvement in symptoms.  Patient also reports some chest tightness but denies any shortness of breath or chest pain.  Denies history of asthma.   Sore Throat  Cough   Past Medical History:  Diagnosis Date   Asthma    Bronchitis, chronic (HCC)    GERD (gastroesophageal reflux disease)    Hiatal hernia    Spondylolisthesis of lumbar region    Stress incontinence    Wears contact lenses     Patient Active Problem List   Diagnosis Date Noted   Spondylolysis, lumbar region 10/07/2017   Lymphadenopathy, inguinal 08/13/2017   Lateral epicondylitis of right elbow 01/04/2017   DERMATITIS HERPETIFORMIS 05/08/2009   TOBACCO ABUSE 03/31/2009   HEMORRHOIDS, EXTERNAL 03/31/2009   FATIGUE 03/31/2009    Past Surgical History:  Procedure Laterality Date   ESOPHAGOGASTRODUODENOSCOPY     KNEE SURGERY     SPINE SURGERY N/A    Phreesia 09/23/2019    OB History   No obstetric history on file.      Home Medications    Prior to Admission medications   Medication Sig Start Date End Date Taking? Authorizing Provider  albuterol (VENTOLIN HFA) 108 (90 Base)  MCG/ACT inhaler Inhale 1-2 puffs into the lungs every 6 (six) hours as needed for wheezing or shortness of breath. 09/20/21  Yes Seleta Hovland, Hildred Alamin E, FNP  acetaminophen (TYLENOL) 500 MG tablet Take 1,000 mg by mouth every 4 (four) hours as needed.     [provider]  calcium carbonate (TUMS - DOSED IN MG ELEMENTAL CALCIUM) 500 MG chewable tablet Chew 1 tablet by mouth daily.    [provider]  FLUoxetine (PROZAC) 20 MG capsule Take 1 capsule (20 mg total) by mouth daily. 09/11/21   Wendie Agreste, MD  ipratropium (ATROVENT) 0.06 % nasal spray Place 1-2 sprays into both nostrils 4 (four) times daily. As needed for nasal congestion 09/05/21   Wendie Agreste, MD  levonorgestrel [facility]) 20 MCG/24HR IUD 1 each by Intrauterine route once.     [provider]  methocarbamol (ROBAXIN-750) 750 MG tablet Take 1 tablet (750 mg total) by mouth every 6 (six) hours as needed for muscle spasms. 10/27/19   Maximiano Coss, NP  omeprazole (PRILOSEC) 20 MG capsule Take 1 capsule (20 mg total) by mouth daily. 05/27/18   Wendie Agreste, MD  ondansetron (ZOFRAN ODT) 4 MG disintegrating tablet Take 1 tablet (4 mg total) by mouth every 8 (eight) hours as needed for nausea or vomiting. Patient not taking: Reported on 03/08/2021 09/24/19   Linna Darner,  Fenton Malling, MD    Family History Family History  Problem Relation Age of Onset   Diabetes Other    Cancer Other    Cancer Mother    Diabetes Brother    Hypertension Brother    Stroke Maternal Grandmother     Social History Social History   Tobacco Use   Smoking status: Every Day    Packs/day: 0.50    Years: 20.00    Total pack years: 10.00    Types: Cigarettes   Smokeless tobacco: Never  Vaping Use   Vaping Use: Never used  Substance Use Topics   Alcohol use: Yes    Alcohol/week: 1.0 standard drink of alcohol    Types: 1 Glasses of wine per week   Drug use: No     Allergies   Beeswax and Chlorhexidine gluconate   Review of  Systems Review of Systems Per HPI  Physical Exam Triage Vital Signs ED Triage Vitals  Enc Vitals Group     BP 09/20/21 1128 (!) 149/70     Pulse Rate 09/20/21 1128 80     Resp 09/20/21 1128 18     Temp 09/20/21 1128 97.7 F (36.5 C)     Temp src --      SpO2 09/20/21 1128 97 %     Weight --      Height --      Head Circumference --      Peak Flow --      Pain Score 09/20/21 1211 0     Pain Loc --      Pain Edu? --      Excl. in [place]? --    No data found.  Updated Vital Signs BP (!) 149/70   Pulse 80   Temp 97.7 F (36.5 C)   Resp 18   SpO2 97%   Visual Acuity Right Eye Distance:   Left Eye Distance:   Bilateral Distance:    Right Eye Near:   Left Eye Near:    Bilateral Near:     Physical Exam Constitutional:      General: She is not in acute distress.    Appearance: Normal appearance. She is not toxic-appearing or diaphoretic.  HENT:     Head: Normocephalic and atraumatic.     Right Ear: Tympanic membrane and ear canal normal.     Left Ear: Tympanic membrane and ear canal normal.     Nose: Congestion present.     Mouth/Throat:     Mouth: Mucous membranes are moist.     Pharynx: Posterior oropharyngeal erythema present.  Eyes:     Extraocular Movements: Extraocular movements intact.     Conjunctiva/sclera: Conjunctivae normal.     Pupils: Pupils are equal, round, and reactive to light.  Cardiovascular:     Rate and Rhythm: Normal rate and regular rhythm.     Pulses: Normal pulses.     Heart sounds: Normal heart sounds.  Pulmonary:     Effort: Pulmonary effort is normal. No respiratory distress.     Breath sounds: Normal breath sounds. No stridor. No wheezing, rhonchi or rales.  Abdominal:     General: Abdomen is flat. Bowel sounds are normal.     Palpations: Abdomen is soft.  Musculoskeletal:        General: Normal range of motion.     Cervical back: Normal range of motion.  Skin:    General: Skin is warm and dry.  Neurological:     General: No  focal deficit present.     Mental Status: She is alert and oriented to person, place, and time. Mental status is at baseline.  Psychiatric:        Mood and Affect: Mood normal.        Behavior: Behavior normal.      UC Treatments / Results  Labs (all labs ordered are listed, but only abnormal results are displayed) Labs Reviewed  CULTURE, GROUP A STREP [facility])  POCT RAPID STREP A (OFFICE)    EKG   Radiology No results found.  Procedures Procedures (including critical care time)  Medications Ordered in UC Medications - No data to display  Initial Impression / Assessment and Plan / UC Course  I have reviewed the triage vital signs and the nursing notes.  Pertinent labs & imaging results that were available during my care of the patient were reviewed by me and considered in my medical decision making (see chart for details).     Patient presents with symptoms likely from a viral upper respiratory infection. Differential includes bacterial pneumonia, sinusitis, allergic rhinitis, covid 19, flu. Do not suspect underlying cardiopulmonary process. Symptoms seem unlikely related to ACS, CHF or COPD exacerbations, pneumonia, pneumothorax. Patient is nontoxic appearing and not in need of emergent medical intervention.  Strep is negative.  Throat culture and COVID test pending.  Recommended symptom control with over the counter medications.  Discussed this with the patient.  Albuterol inhaler prescribed for patient's chest tightness.  Do not think chest imaging is necessary given no adventitious lung sounds on exam.  Return if symptoms fail to improve in 1-2 weeks or you develop shortness of breath, chest pain, severe headache. Patient states understanding and is agreeable.  Discharged with PCP followup.  Final Clinical Impressions(s) / UC Diagnoses   Final diagnoses:  Viral upper respiratory tract infection with cough  Sore throat     Discharge Instructions      Strep was  negative.  Throat culture is pending.  It appears that you have a viral upper respiratory infection that should run its course and self resolve with symptomatic treatment.  You have been prescribed an albuterol inhaler to take as needed for chest tightness.  Please follow-up if symptoms persist or worsen.    ED Prescriptions     Medication Sig Dispense Auth. Provider   albuterol (VENTOLIN HFA) 108 (90 Base) MCG/ACT inhaler Inhale 1-2 puffs into the lungs every 6 (six) hours as needed for wheezing or shortness of breath. 1 each Teodora Medici, [place]      PDMP not reviewed this encounter.   Teodora Medici, [place] 09/20/21 1250

## 2021-09-20 NOTE — Discharge Instructions (Signed)
Strep was negative.  Throat culture is pending.  It appears that you have a viral upper respiratory infection that should run its course and self resolve with symptomatic treatment.  You have been prescribed an albuterol inhaler to take as needed for chest tightness.  Please follow-up if symptoms persist or worsen.

## 2021-09-20 NOTE — ED Triage Notes (Signed)
Pt presents to uc with co of cough, congestion, sore throat, chills, and body aches for 2 days. Pt reports she works for [employer] and is concerned for either strep or covid. Pt did take at home covid test that was neg and went to the covid testing center which she will get the results of tonight.

## 2021-09-23 LAB — CULTURE, GROUP A STREP (THRC)

## 2021-09-30 ENCOUNTER — Ambulatory Visit
Admission: EM | Admit: 2021-09-30 | Discharge: 2021-09-30 | Disposition: A | Discharge status: Home / Self Care | Payer: 59 | Attending: Internal Medicine | Admitting: Internal Medicine

## 2021-09-30 DIAGNOSIS — R053 Chronic cough: Secondary | ICD-10-CM | POA: Diagnosis not present

## 2021-09-30 DIAGNOSIS — N390 Urinary tract infection, site not specified: Secondary | ICD-10-CM | POA: Diagnosis not present

## 2021-09-30 DIAGNOSIS — R300 Dysuria: Secondary | ICD-10-CM | POA: Insufficient documentation

## 2021-09-30 DIAGNOSIS — R197 Diarrhea, unspecified: Secondary | ICD-10-CM | POA: Insufficient documentation

## 2021-09-30 LAB — POCT URINALYSIS DIP (MANUAL ENTRY)
Bilirubin, UA: NEGATIVE
Glucose, UA: NEGATIVE mg/dL
Ketones, POC UA: NEGATIVE mg/dL
Nitrite, UA: NEGATIVE
Protein Ur, POC: NEGATIVE mg/dL
Spec Grav, UA: 1.025 (ref 1.010–1.025)
Urobilinogen, UA: 0.2 E.U./dL
pH, UA: 5 (ref 5.0–8.0)

## 2021-09-30 MED ORDER — CEPHALEXIN 500 MG PO CAPS: 500.0000 mg | ORAL_CAPSULE | Freq: Four times a day (QID) | ORAL | 0 refills | Status: DC

## 2021-09-30 MED ORDER — PREDNISONE 20 MG PO TABS: 40.0000 mg | ORAL_TABLET | Freq: Every day | ORAL | 0 refills | Status: AC

## 2021-09-30 NOTE — ED Triage Notes (Signed)
Pt presents to uc with cough, congestion diarrhea and dysuria. Pt reports she was neg for covid but husband was positive so they treated her like she had it. Pt reports she has had diarrhea with mild nausea for 11 days imodium has helped but only helps for one day then she has to start taking it again. Pt reports new onset of dysuria since yesterday.

## 2021-09-30 NOTE — ED Provider Notes (Signed)
[facility]    CSN: [id number] Arrival date & time: 09/30/21  1102      History   Chief Complaint Chief Complaint  Patient presents with   Dysuria    HPI Candice Pratt is a 56 y.o. female.   Patient presents with persistent cough, nasal congestion, diarrhea, urinary burning, urinary frequency.  Patient was seen on 09/20/2021 for same upper respiratory symptoms.  She had a negative COVID test at health at work but was presumed to have COVID given that her husband tested positive.  She was treated with albuterol inhaler and has been taking over-the-counter cold and flu medications with intermittent improvement in symptoms.  Denies any associated fever recently.  Denies chest pain or shortness of breath.  Denies history of asthma or COPD.  Patient also reports diarrhea that started about 8 days ago.  Denies blood in stool or any associated abdominal pain.  Patient reports nausea without vomiting but is attributing this to phlegm in her throat.  She reports that it is very "watery diarrhea" and she has had approximately 1-2 times a day.  Has taken imodium with temporary improvement. Last episode of diarrhea was yesterday.  Patient reports urinary burning and frequency that started this morning.  Denies hematuria, abnormal vaginal bleeding, vaginal discharge, back pain, fever.   Dysuria   Past Medical History:  Diagnosis Date   Asthma    Bronchitis, chronic (HCC)    GERD (gastroesophageal reflux disease)    Hiatal hernia    Spondylolisthesis of lumbar region    Stress incontinence    Wears contact lenses     Patient Active Problem List   Diagnosis Date Noted   Spondylolysis, lumbar region 10/07/2017   Lymphadenopathy, inguinal 08/13/2017   Lateral epicondylitis of right elbow 01/04/2017   DERMATITIS HERPETIFORMIS 05/08/2009   TOBACCO ABUSE 03/31/2009   HEMORRHOIDS, EXTERNAL 03/31/2009   FATIGUE 03/31/2009    Past Surgical History:  Procedure Laterality Date    ESOPHAGOGASTRODUODENOSCOPY     KNEE SURGERY     SPINE SURGERY N/A    [employer]reesia 09/23/2019    OB History   No obstetric history on file.      Home Medications    Prior to Admission medications   Medication Sig Start Date End Date Taking? Authorizing Provider  cephALEXin (KEFLEX) 500 MG capsule Take 1 capsule (500 mg total) by mouth 4 (four) times daily. 09/30/21  Yes Onisha Cedeno, Rolly Salter E, FNP  predniSONE (DELTASONE) 20 MG tablet Take 2 tablets (40 mg total) by mouth daily for 5 days. 09/30/21 10/05/21 Yes Jhania Etherington, Acie Fredrickson, FNP  acetaminophen (TYLENOL) 500 MG tablet Take 1,000 mg by mouth every 4 (four) hours as needed.     [provider]  albuterol (VENTOLIN HFA) 108 (90 Base) MCG/ACT inhaler Inhale 1-2 puffs into the lungs every 6 (six) hours as needed for wheezing or shortness of breath. 09/20/21   Gustavus Bryant, FNP  calcium carbonate (TUMS - DOSED IN MG ELEMENTAL CALCIUM) 500 MG chewable tablet Chew 1 tablet by mouth daily.    [provider]  FLUoxetine (PROZAC) 20 MG capsule Take 1 capsule (20 mg total) by mouth daily. 09/11/21   Shade Flood, MD  ipratropium (ATROVENT) 0.06 % nasal spray Place 1-2 sprays into both nostrils 4 (four) times daily. As needed for nasal congestion 09/05/21   Shade Flood, MD  levonorgestrel [facility]) 20 MCG/24HR IUD 1 each by Intrauterine route once.     [provider]  methocarbamol (ROBAXIN-750) 750 MG tablet Take 1 tablet (750 mg total) by mouth every 6 (six) hours as needed for muscle spasms. 10/27/19   Maximiano Coss, NP  omeprazole (PRILOSEC) 20 MG capsule Take 1 capsule (20 mg total) by mouth daily. 05/27/18   Wendie Agreste, MD  ondansetron (ZOFRAN ODT) 4 MG disintegrating tablet Take 1 tablet (4 mg total) by mouth every 8 (eight) hours as needed for nausea or vomiting. Patient not taking: Reported on 03/08/2021 09/24/19   Posey Boyer, MD    Family History Family History  Problem Relation Age of Onset   Diabetes  Other    Cancer Other    Cancer Mother    Diabetes Brother    Hypertension Brother    Stroke Maternal Grandmother     Social History Social History   Tobacco Use   Smoking status: Every Day    Packs/day: 0.50    Years: 20.00    Total pack years: 10.00    Types: Cigarettes   Smokeless tobacco: Never  Vaping Use   Vaping Use: Never used  Substance Use Topics   Alcohol use: Yes    Alcohol/week: 1.0 standard drink of alcohol    Types: 1 Glasses of wine per week   Drug use: No     Allergies   Beeswax and Chlorhexidine gluconate   Review of Systems Review of Systems Per HPI  Physical Exam Triage Vital Signs ED Triage Vitals [09/30/21 1125]  Enc Vitals Group     BP (!) 138/96     Pulse Rate 82     Resp 20     Temp 98.3 F (36.8 C)     Temp src      SpO2 97 %     Weight      Height      Head Circumference      Peak Flow      Pain Score      Pain Loc      Pain Edu?      Excl. in [place]?    No data found.  Updated Vital Signs BP (!) 138/96   Pulse 82   Temp 98.3 F (36.8 C)   Resp 20   SpO2 97%   Visual Acuity Right Eye Distance:   Left Eye Distance:   Bilateral Distance:    Right Eye Near:   Left Eye Near:    Bilateral Near:     Physical Exam Constitutional:      General: She is not in acute distress.    Appearance: Normal appearance. She is not toxic-appearing or diaphoretic.  HENT:     Head: Normocephalic and atraumatic.     Right Ear: Tympanic membrane and ear canal normal.     Left Ear: Tympanic membrane and ear canal normal.     Nose: Congestion present.     Mouth/Throat:     Mouth: Mucous membranes are moist.     Pharynx: No posterior oropharyngeal erythema.  Eyes:     Extraocular Movements: Extraocular movements intact.     Conjunctiva/sclera: Conjunctivae normal.     Pupils: Pupils are equal, round, and reactive to light.  Cardiovascular:     Rate and Rhythm: Normal rate and regular rhythm.     Pulses: Normal pulses.     Heart  sounds: Normal heart sounds.  Pulmonary:     Effort: Pulmonary effort is normal. No respiratory distress.     Breath sounds: Normal breath sounds. No stridor. No  wheezing, rhonchi or rales.  Abdominal:     General: Abdomen is flat. Bowel sounds are normal. There is no distension.     Palpations: Abdomen is soft.     Tenderness: There is no abdominal tenderness.  Musculoskeletal:        General: Normal range of motion.     Cervical back: Normal range of motion.  Skin:    General: Skin is warm and dry.  Neurological:     General: No focal deficit present.     Mental Status: She is alert and oriented to person, place, and time. Mental status is at baseline.  Psychiatric:        Mood and Affect: Mood normal.        Behavior: Behavior normal.      UC Treatments / Results  Labs (all labs ordered are listed, but only abnormal results are displayed) Labs Reviewed  POCT URINALYSIS DIP (MANUAL ENTRY) - Abnormal; Notable for the following components:      Result Value   Blood, UA trace-intact (*)    Leukocytes, UA Small (1+) (*)    All other components within normal limits  URINE CULTURE  COMPREHENSIVE METABOLIC PANEL  CBC    EKG   Radiology No results found.  Procedures Procedures (including critical care time)  Medications Ordered in UC Medications - No data to display  Initial Impression / Assessment and Plan / UC Course  I have reviewed the triage vital signs and the nursing notes.  Pertinent labs & imaging results that were available during my care of the patient were reviewed by me and considered in my medical decision making (see chart for details).     Patient having persistent upper respiratory symptoms with cough.  I do think obtaining a chest x-ray is reasonable.  Do not have x-ray tech in urgent care today so outpatient imaging was ordered at [facility].  Awaiting result.  Will treat with prednisone steroid given persistent cough.  She has taken  this previously and has tolerated well.  Patient having persistent diarrhea for 8 days.  Patient was unable to provide stool sample here in urgent care so patient was sent home with stool sample kit and advised to return return to urgent care with sample for testing.  Standing orders created once stool sample is returned.  CMP and CBC also pending.  No current signs of dehydration on exam so do not think that IV fluids are necessary.  Patient reports that she has been drinking fluids appropriately.  Bland diet as well.  Patient has urinary tract infection.  Will treat with cephalexin antibiotic.  Creatinine clearance is 77 so no dosage adjustment necessary.  Urine culture is pending.  Discussed strict return and ER precautions for all chief complaints today.  Patient verbalized understanding and was agreeable with plan.  Final Clinical Impressions(s) / UC Diagnoses   Final diagnoses:  Persistent cough  Diarrhea, unspecified type  Lower urinary tract infection  Dysuria     Discharge Instructions      Your chest x-ray is pending.  Please go to [facility] today and ask for the imaging center.  You will simply get an x-ray and leave.  Please do not sign in to the emergency department to be seen.  We will call with any abnormalities.  You were prescribed prednisone for persistent symptoms.  Obtain stool sample and bring back to urgent care for evaluation.  Blood work is pending.  You also have  urinary tract infection which is being treated with antibiotic.  Urine culture is pending.  We will call when it results.    ED Prescriptions     Medication Sig Dispense Auth. Provider   cephALEXin (KEFLEX) 500 MG capsule Take 1 capsule (500 mg total) by mouth 4 (four) times daily. 28 capsule [place], [place] E, [place]   predniSONE (DELTASONE) 20 MG tablet Take 2 tablets (40 mg total) by mouth daily for 5 days. 10 tablet Teodora Medici, [place]      PDMP not reviewed this encounter.   Teodora Medici, [place] 09/30/21 1235

## 2021-09-30 NOTE — Discharge Instructions (Addendum)
Your chest x-ray is pending.  Please go to [facility] today and ask for the imaging center.  You will simply get an x-ray and leave.  Please do not sign in to the emergency department to be seen.  We will call with any abnormalities.  You were prescribed prednisone for persistent symptoms.  Obtain stool sample and bring back to urgent care for evaluation.  Blood work is pending.  You also have urinary tract infection which is being treated with antibiotic.  Urine culture is pending.  We will call when it results.

## 2021-10-01 ENCOUNTER — Telehealth: Payer: Self-pay

## 2021-10-01 ENCOUNTER — Other Ambulatory Visit: Payer: Self-pay

## 2021-10-01 ENCOUNTER — Telehealth: Payer: Self-pay | Admitting: Physician Assistant

## 2021-10-01 ENCOUNTER — Ambulatory Visit (HOSPITAL_BASED_OUTPATIENT_CLINIC_OR_DEPARTMENT_OTHER)
Admission: RE | Admit: 2021-10-01 | Discharge: 2021-10-01 | Disposition: A | Discharge status: Home / Self Care | Payer: 59 | Source: Ambulatory Visit | Attending: Internal Medicine | Admitting: Internal Medicine

## 2021-10-01 DIAGNOSIS — Z20822 Contact with and (suspected) exposure to covid-19: Secondary | ICD-10-CM | POA: Insufficient documentation

## 2021-10-01 DIAGNOSIS — R059 Cough, unspecified: Secondary | ICD-10-CM | POA: Insufficient documentation

## 2021-10-01 DIAGNOSIS — R197 Diarrhea, unspecified: Secondary | ICD-10-CM

## 2021-10-01 LAB — CBC
Hematocrit: 45.5 % (ref 34.0–46.6)
Hemoglobin: 14.4 g/dL (ref 11.1–15.9)
MCH: 28.4 pg (ref 26.6–33.0)
MCHC: 31.6 g/dL (ref 31.5–35.7)
MCV: 90 fL (ref 79–97)
Platelets: 370 10*3/uL (ref 150–450)
RBC: 5.07 x10E6/uL (ref 3.77–5.28)
RDW: 12.8 % (ref 11.7–15.4)
WBC: 8.5 10*3/uL (ref 3.4–10.8)

## 2021-10-01 LAB — COMPREHENSIVE METABOLIC PANEL
ALT: 32 IU/L (ref 0–32)
AST: 22 IU/L (ref 0–40)
Albumin/Globulin Ratio: 2.1 (ref 1.2–2.2)
Albumin: 4.8 g/dL (ref 3.8–4.9)
Alkaline Phosphatase: 36 IU/L — ABNORMAL LOW (ref 44–121)
BUN/Creatinine Ratio: 14 (ref 9–23)
BUN: 14 mg/dL (ref 6–24)
Bilirubin Total: 0.2 mg/dL (ref 0.0–1.2)
CO2: 22 mmol/L (ref 20–29)
Calcium: 10.7 mg/dL — ABNORMAL HIGH (ref 8.7–10.2)
Chloride: 95 mmol/L — ABNORMAL LOW (ref 96–106)
Creatinine, Ser: 0.97 mg/dL (ref 0.57–1.00)
Globulin, Total: 2.3 g/dL (ref 1.5–4.5)
Glucose: 83 mg/dL (ref 70–99)
Potassium: 4.9 mmol/L (ref 3.5–5.2)
Sodium: 136 mmol/L (ref 134–144)
Total Protein: 7.1 g/dL (ref 6.0–8.5)
eGFR: 69 mL/min/{1.73_m2} (ref >59)

## 2021-10-01 LAB — URINE CULTURE

## 2021-10-01 NOTE — Telephone Encounter (Signed)
Needed lab orders corrected.

## 2021-10-01 NOTE — Telephone Encounter (Signed)
[place] [facility] called clinic to notify us that the c. Diff sample was solid/formed and unable to be tested.

## 2021-10-01 NOTE — Telephone Encounter (Signed)
Advised pt that stool test unable to be performed d/t solid consistency.  Pt states her diarrhea is starting to resolve - alternating episodes of loose stools vs formed stools.  More loose stools when only having liquids and solid when she eats.  Will stop by clinic to pick up add'l supplies and provide sample for another test if she continues having loose stools. RN staff at clinic aware.

## 2021-10-01 NOTE — Telephone Encounter (Signed)
Computer problem delayed sending samples off was unable to get the correct requisition. PA on site approved verbal orders to be placed again to send sample today.

## 2021-10-02 ENCOUNTER — Telehealth: Payer: Self-pay | Admitting: Emergency Medicine

## 2021-10-02 LAB — GASTROINTESTINAL PANEL BY PCR, STOOL (REPLACES STOOL CULTURE)

## 2021-10-02 NOTE — Telephone Encounter (Signed)
Notified patient via telephone call of results. Patient is no longer having diarrhea- has not had in 48  hours. Recommended continued monitoring, no antibiotics recommended at this time, but encouraged follow up should symptoms return. Patient expresses understanding.

## 2021-10-02 NOTE — Telephone Encounter (Signed)
Microbiology called and states hat patient tested positive for Pathogenic e coli

## 2021-10-04 ENCOUNTER — Other Ambulatory Visit: Payer: Self-pay | Admitting: Family Medicine

## 2021-10-04 DIAGNOSIS — R4589 Other symptoms and signs involving emotional state: Secondary | ICD-10-CM

## 2021-10-04 DIAGNOSIS — Z124 Encounter for screening for malignant neoplasm of cervix: Secondary | ICD-10-CM | POA: Diagnosis not present

## 2021-10-04 DIAGNOSIS — Z1231 Encounter for screening mammogram for malignant neoplasm of breast: Secondary | ICD-10-CM | POA: Diagnosis not present

## 2021-10-04 DIAGNOSIS — F439 Reaction to severe stress, unspecified: Secondary | ICD-10-CM

## 2021-10-04 DIAGNOSIS — Z01419 Encounter for gynecological examination (general) (routine) without abnormal findings: Secondary | ICD-10-CM | POA: Diagnosis not present

## 2021-10-04 LAB — HM PAP SMEAR

## 2021-10-05 ENCOUNTER — Encounter: Payer: Self-pay | Admitting: Family Medicine

## 2021-10-05 ENCOUNTER — Encounter: Payer: Self-pay | Admitting: Internal Medicine

## 2021-10-10 ENCOUNTER — Telehealth (HOSPITAL_COMMUNITY): Payer: Self-pay | Admitting: Emergency Medicine

## 2021-10-10 NOTE — Telephone Encounter (Signed)
Per Hildred Alamin, APP "if you dont mind to tell her that chest x-ray was negative. Looks like calcium was mildly elevated. I would have her follow up with PCP for recheck."  Attempted to reach patient, LVM She has PCP appt scheduled for 10/13/21

## 2021-10-12 ENCOUNTER — Ambulatory Visit: Payer: 59 | Admitting: Family Medicine

## 2021-10-12 ENCOUNTER — Encounter: Payer: Self-pay | Admitting: Family Medicine

## 2021-10-12 VITALS — BP 122/80 | HR 75 | Temp 98.1°F | Ht 71.0 in | Wt 206.2 lb

## 2021-10-12 DIAGNOSIS — N390 Urinary tract infection, site not specified: Secondary | ICD-10-CM | POA: Diagnosis not present

## 2021-10-12 DIAGNOSIS — U071 COVID-19: Secondary | ICD-10-CM | POA: Diagnosis not present

## 2021-10-12 DIAGNOSIS — F419 Anxiety disorder, unspecified: Secondary | ICD-10-CM | POA: Diagnosis not present

## 2021-10-12 DIAGNOSIS — R4589 Other symptoms and signs involving emotional state: Secondary | ICD-10-CM

## 2021-10-12 DIAGNOSIS — A09 Infectious gastroenteritis and colitis, unspecified: Secondary | ICD-10-CM

## 2021-10-12 DIAGNOSIS — Z23 Encounter for immunization: Secondary | ICD-10-CM | POA: Diagnosis not present

## 2021-10-12 DIAGNOSIS — F439 Reaction to severe stress, unspecified: Secondary | ICD-10-CM

## 2021-10-12 MED ORDER — FLUOXETINE HCL 20 MG PO CAPS
20.0000 mg | ORAL_CAPSULE | Freq: Every day | ORAL | 1 refills | Status: DC
  Filled 2021-11-05: qty 90, 90d supply, fill #0

## 2021-10-12 NOTE — Progress Notes (Signed)
Subjective:  Patient ID: Candice Pratt, female    DOB: [birth year]  Age: 56 y.o. MRN: [id number]  CC:  Chief Complaint  Patient presents with   Anxiety    Pt states all is well Pt also states she needs FMLA paperwork filled out  GAD screening  7    HPI Candice Pratt presents for   Anxiety Discussed at her visit August 2 along with other concerns at that time.  Has met with OB/GYN. Now on estrogen patch. Feeling better with hot flushes.  Situational stressors discussed at last visit.  Suspected component of depression versus anxiety in addition to adjustment component of stress with work.  Initially tried Wellbutrin 150 mg daily as she was also trying to quit smoking.  Recommended continued meeting with her therapist. Phone note August 7, noticed increased anxiety with Wellbutrin which we did discuss was a possibility.  Medication was stopped and instead started fluoxetine 20 mg daily (had taken fluoxetine in the past with good response).  Still taking prozac QD. No side effects. Working well. Anxiety managed better.  Weekly therapy, pause during illness but otherwise weekly.  Feels like managing work stress differently - better.   Needs FMLA paperwork completed for recent illness. See below.      10/12/2021    8:15 AM  GAD 7 : Generalized Anxiety Score  Nervous, Anxious, on Edge 2  Control/stop worrying 2  Worry too much - different things 1  Trouble relaxing 1  Restless 0  Easily annoyed or irritable 0  Afraid - awful might happen 1  Total GAD 7 Score 7  Anxiety Difficulty Not difficult at all      10/12/2021    8:15 AM 09/05/2021    3:49 PM 03/08/2021   11:32 AM 10/27/2019    3:53 PM 10/27/2019    3:48 PM  Depression screen PHQ 2/9  Decreased Interest 0 1 0 0 0  Down, Depressed, Hopeless 0 2 0 0 0  PHQ - 2 Score 0 3 0 0 0  Altered sleeping 0 1 0    Tired, decreased energy 0 2 0    Change in appetite 0 0 0    Feeling bad or failure about yourself  0 0 0    Trouble  concentrating 0 0 0    Moving slowly or fidgety/restless 0 0 0    Suicidal thoughts 0 0 0    PHQ-9 Score 0 6 0    Difficult doing work/chores   Not difficult at all     Covid infection: Urgent care eval on 8/17. URI sx's starting 8/15. Worked through 8/16. Off 8/17. Initial negative covid (through health at work) and strep testing.    Positive home covid test on 8/19. Husband also sick with covid at that time.  Initially planned to return on 09/27/21. No antiviral used. However had diarrheal illness from 8/19 through 9/1.  Repeat urgent care eval on 09/30/21. Persistent cough, diarrhea and UTI - Rx keflex, prednisone - completed. C. diff test unable to be performed. GI pathogen panel - EPEC E.coli noted. No new rx needed. Cough better. Returned to work 10/08/21. Doing ok back at work. Diarrhea resolved. Feeling well. Normal BM's. Normal intake.     History Patient Active Problem List   Diagnosis Date Noted   Spondylolysis, lumbar region 10/07/2017   Lymphadenopathy, inguinal 08/13/2017   Lateral epicondylitis of right elbow 01/04/2017   DERMATITIS HERPETIFORMIS 05/08/2009   TOBACCO ABUSE 03/31/2009  HEMORRHOIDS, EXTERNAL 03/31/2009   FATIGUE 03/31/2009   Past Medical History:  Diagnosis Date   Asthma    Bronchitis, chronic (HCC)    GERD (gastroesophageal reflux disease)    Hiatal hernia    Spondylolisthesis of lumbar region    Stress incontinence    Wears contact lenses    Past Surgical History:  Procedure Laterality Date   ESOPHAGOGASTRODUODENOSCOPY     KNEE SURGERY     SPINE SURGERY N/A    [employer]rees[employer] 09/23/2019   Allergies  Allergen Reactions   Beeswax Swelling    When applied topically   Chlorhexidine Gluconate Itching   Prior to Admission medications   Medication Sig Start Date End Date Taking? Authorizing Provider  acetaminophen (TYLENOL) 500 MG tablet Take 1,000 mg by mouth every 4 (four) hours as needed.    Yes [provider]  albuterol (VENTOLIN HFA) 108  (90 Base) MCG/ACT inhaler Inhale 1-2 puffs into the lungs every 6 (six) hours as needed for wheezing or shortness of breath. 09/20/21  Yes [place], Hildred Alamin E, FNP  calcium carbonate (TUMS - DOSED IN MG ELEMENTAL CALCIUM) 500 MG chewable tablet Chew 1 tablet by mouth daily.   Yes [provider]  FLUoxetine (PROZAC) 20 MG capsule Take 1 capsule (20 mg total) by mouth daily. 09/11/21  Yes Wendie Agreste, MD  ipratropium (ATROVENT) 0.06 % nasal spray Place 1-2 sprays into both nostrils 4 (four) times daily. As needed for nasal congestion 09/05/21  Yes Wendie Agreste, MD  levonorgestrel [facility]) 20 MCG/24HR IUD 1 each by Intrauterine route once.    Yes [provider]  methocarbamol (ROBAXIN-750) 750 MG tablet Take 1 tablet (750 mg total) by mouth every 6 (six) hours as needed for muscle spasms. 10/27/19  Yes Maximiano Coss, NP  cephALEXin (KEFLEX) 500 MG capsule Take 1 capsule (500 mg total) by mouth 4 (four) times daily. Patient not taking: Reported on 10/12/2021 09/30/21   Teodora Medici, FNP  omeprazole (PRILOSEC) 20 MG capsule Take 1 capsule (20 mg total) by mouth daily. 05/27/18   Wendie Agreste, MD  ondansetron (ZOFRAN ODT) 4 MG disintegrating tablet Take 1 tablet (4 mg total) by mouth every 8 (eight) hours as needed for nausea or vomiting. Patient not taking: Reported on 03/08/2021 09/24/19   Posey Boyer, MD   Social History   Socioeconomic History   Marital status: Married    Spouse name: Not on file   Number of children: 3   Years of education: Not on file   Highest education level: Not on file  Occupational History    Employer: [employer]  Tobacco Use   Smoking status: Every Day    Packs/day: 0.50    Years: 20.00    Total pack years: 10.00    Types: Cigarettes   Smokeless tobacco: Never  Vaping Use   Vaping Use: Never used  Substance and Sexual Activity   Alcohol use: Yes    Alcohol/week: 1.0 standard drink of alcohol    Types: 1 Glasses of wine per week    Drug use: No   Sexual activity: Yes    Birth control/protection: I.U.D.  Other Topics Concern   Not on file  Social History Narrative   Not on file   Social Determinants of Health   Financial Resource Strain: Not on file  Food Insecurity: Not on file  Transportation Needs: Not on file  Physical Activity: Not on file  Stress: Not on file  Social Connections:  Not on file  Intimate Partner Violence: Not on file    Review of Systems   Objective:   Vitals:   10/12/21 0819  BP: 122/80  Pulse: 75  Temp: 98.1 F (36.7 C)  SpO2: 96%  Weight: 206 lb 3.2 oz (93.5 kg)  Height: $Remove'5\' 11"'[id number]$  (1.803 m)     Physical Exam Vitals reviewed.  Constitutional:      General: She is not in acute distress.    Appearance: Normal appearance. She is well-developed. She is not ill-appearing, toxic-appearing or diaphoretic.  HENT:     Head: Normocephalic and atraumatic.  Eyes:     Conjunctiva/sclera: Conjunctivae normal.     Pupils: Pupils are equal, round, and reactive to light.  Neck:     Vascular: No carotid bruit.  Cardiovascular:     Rate and Rhythm: Normal rate and regular rhythm.     Heart sounds: Normal heart sounds.  Pulmonary:     Effort: Pulmonary effort is normal.     Breath sounds: Normal breath sounds.  Abdominal:     General: Abdomen is flat. There is no distension.     Palpations: Abdomen is soft. There is no pulsatile mass.     Tenderness: There is no abdominal tenderness. There is no guarding.  Musculoskeletal:     Right lower leg: No edema.     Left lower leg: No edema.  Skin:    General: Skin is warm and dry.  Neurological:     Mental Status: She is alert and oriented to person, place, and time.  Psychiatric:        Mood and Affect: Mood normal.        Behavior: Behavior normal.        Thought Content: Thought content normal.       Assessment & Plan:  Candice Pratt is a 56 y.o. female . Anxiety Situational stress  -Improved with Prozac, continue  counseling, continue same dose Prozac.  Recent start of hormone treatment for postmenopausal symptoms likely will help as well.  Need for tetanus booster - Plan: Tdap vaccine greater than or equal to 7yo IM - left prior to vaccine. Can schedule nurse visit.   COVID-19 virus infection Urinary tract infection without hematuria, site unspecified Diarrhea of infectious origin  -Status post treatment as above, symptoms have resolved.  FMLA paperwork will be completed  No orders of the defined types were placed in this encounter.  Patient Instructions  Continue prozac once per day, keep follow up with therapist. Hormone treatment from OBGYN is also likely helping. Glad you are doing better.     Signed,   Merri Ray, MD [place] 10/12/21 9:03 AM

## 2021-10-12 NOTE — Patient Instructions (Addendum)
Continue prozac once per day, keep follow up with therapist. Hormone treatment from OBGYN is also likely helping. Glad you are doing better.

## 2021-10-15 ENCOUNTER — Ambulatory Visit: Payer: 59 | Admitting: Family Medicine

## 2021-10-24 ENCOUNTER — Telehealth: Payer: Self-pay | Admitting: Family Medicine

## 2021-10-24 NOTE — Telephone Encounter (Signed)
FMLA paperwork completed previously, but unable to locate.  I completed new paperwork today.  Ready to pick up/fax.

## 2021-10-31 DIAGNOSIS — N951 Menopausal and female climacteric states: Secondary | ICD-10-CM | POA: Diagnosis not present

## 2021-11-02 ENCOUNTER — Telehealth: Payer: Self-pay | Admitting: Family Medicine

## 2021-11-02 ENCOUNTER — Other Ambulatory Visit (HOSPITAL_COMMUNITY): Payer: Self-pay

## 2021-11-02 NOTE — Telephone Encounter (Signed)
error 

## 2021-11-05 ENCOUNTER — Other Ambulatory Visit (HOSPITAL_COMMUNITY): Payer: Self-pay

## 2021-11-05 MED ORDER — ESTRADIOL 0.05 MG/24HR TD PTTW
1.0000 | MEDICATED_PATCH | TRANSDERMAL | 3 refills | Status: AC
  Filled 2021-11-05: qty 24, 84d supply, fill #0
  Filled 2022-01-27: qty 24, 84d supply, fill #1
  Filled 2022-05-08 – 2022-06-24 (×3): qty 24, 84d supply, fill #2

## 2021-11-08 ENCOUNTER — Ambulatory Visit: Payer: 59 | Admitting: Family Medicine

## 2022-01-02 ENCOUNTER — Ambulatory Visit: Payer: 59 | Admitting: Internal Medicine

## 2022-01-05 ENCOUNTER — Ambulatory Visit
Admission: EM | Admit: 2022-01-05 | Discharge: 2022-01-05 | Disposition: A | Discharge status: Home / Self Care | Payer: 59 | Attending: Internal Medicine | Admitting: Internal Medicine

## 2022-01-05 DIAGNOSIS — H60392 Other infective otitis externa, left ear: Secondary | ICD-10-CM

## 2022-01-05 MED ORDER — CIPROFLOXACIN-DEXAMETHASONE 0.3-0.1 % OT SUSP: 4.0000 [drp] | Freq: Two times a day (BID) | OTIC | 0 refills | Status: AC

## 2022-01-05 NOTE — Discharge Instructions (Signed)
I am treating you for infection of the canal of your ear with antibiotic eardrops.  Please follow-up with ear, nose, throat specialist on Monday to schedule appointment for further evaluation and management.

## 2022-01-05 NOTE — ED Provider Notes (Signed)
[facility]    CSN: [id number] Arrival date & time: 01/05/22  0901      History   Chief Complaint Chief Complaint  Patient presents with   Otalgia    HPI Candice Pratt is a 56 y.o. female.   Patient presents with left ear discomfort that started about 2 weeks ago.  Patient reports that she started having some feelings of ear fullness, pain, swelling, possible drainage from the ear that started about 2 weeks ago, lasted for about 3 days, and then resolved.  Patient reports that the symptoms returned about 3 days ago.  She denies any obvious trauma to the ear but reports that her ears are constantly itchy so she bought these ear tools where she constantly cleans out her ears and scratches them.  Denies any associated fever or upper respiratory symptoms.   Otalgia   Past Medical History:  Diagnosis Date   Asthma    Bronchitis, chronic (HCC)    GERD (gastroesophageal reflux disease)    Hiatal hernia    Spondylolisthesis of lumbar region    Stress incontinence    Wears contact lenses     Patient Active Problem List   Diagnosis Date Noted   Spondylolysis, lumbar region 10/07/2017   Lymphadenopathy, inguinal 08/13/2017   Lateral epicondylitis of right elbow 01/04/2017   DERMATITIS HERPETIFORMIS 05/08/2009   TOBACCO ABUSE 03/31/2009   HEMORRHOIDS, EXTERNAL 03/31/2009   FATIGUE 03/31/2009    Past Surgical History:  Procedure Laterality Date   ESOPHAGOGASTRODUODENOSCOPY     KNEE SURGERY     SPINE SURGERY N/A    Phreesia 09/23/2019    OB History   No obstetric history on file.      Home Medications    Prior to Admission medications   Medication Sig Start Date End Date Taking? Authorizing Provider  ciprofloxacin-dexamethasone (CIPRODEX) OTIC suspension Place 4 drops into the left ear 2 (two) times daily for 7 days. 01/05/22 01/12/22 Yes Chandelle Harkey, Michele Rockers, FNP  acetaminophen (TYLENOL) 500 MG tablet Take 1,000 mg by mouth every 4 (four) hours as needed.      [provider]  albuterol (VENTOLIN HFA) 108 (90 Base) MCG/ACT inhaler Inhale 1-2 puffs into the lungs every 6 (six) hours as needed for wheezing or shortness of breath. 09/20/21   Teodora Medici, FNP  calcium carbonate (TUMS - DOSED IN MG ELEMENTAL CALCIUM) 500 MG chewable tablet Chew 1 tablet by mouth daily.    [provider]  estradiol (VIVELLE-DOT) 0.05 MG/24HR patch Place 1 patch (0.05 mg total) onto the skin 2 (two) times a week. 11/05/21   Jerelyn Charles, MD  FLUoxetine (PROZAC) 20 MG capsule Take 1 capsule (20 mg total) by mouth daily. 10/12/21   Wendie Agreste, MD  ipratropium (ATROVENT) 0.06 % nasal spray Place 1-2 sprays into both nostrils 4 (four) times daily. As needed for nasal congestion 09/05/21   Wendie Agreste, MD  levonorgestrel [facility]) 20 MCG/24HR IUD 1 each by Intrauterine route once.     [provider]  methocarbamol (ROBAXIN-750) 750 MG tablet Take 1 tablet (750 mg total) by mouth every 6 (six) hours as needed for muscle spasms. 10/27/19   Maximiano Coss, NP    Family History Family History  Problem Relation Age of Onset   Diabetes Other    Cancer Other    Cancer Mother    Diabetes Brother    Hypertension Brother    Stroke Maternal Grandmother     Social History  Social History   Tobacco Use   Smoking status: Every Day    Packs/day: 0.50    Years: 20.00    Total pack years: 10.00    Types: Cigarettes   Smokeless tobacco: Never  Vaping Use   Vaping Use: Never used  Substance Use Topics   Alcohol use: Yes    Alcohol/week: 1.0 standard drink of alcohol    Types: 1 Glasses of wine per week   Drug use: No     Allergies   Beeswax and Chlorhexidine gluconate   Review of Systems Review of Systems Per HPI  Physical Exam Triage Vital Signs ED Triage Vitals [01/05/22 0925]  Enc Vitals Group     BP 125/62     Pulse Rate 85     Resp 16     Temp 98.5 F (36.9 C)     Temp Source Oral     SpO2 97 %     Weight       Height      Head Circumference      Peak Flow      Pain Score 7     Pain Loc      Pain Edu?      Excl. in [place]?    No data found.  Updated Vital Signs BP 125/62 (BP Location: Left Arm)   Pulse 85   Temp 98.5 F (36.9 C) (Oral)   Resp 16   SpO2 97%   Visual Acuity Right Eye Distance:   Left Eye Distance:   Bilateral Distance:    Right Eye Near:   Left Eye Near:    Bilateral Near:     Physical Exam Constitutional:      General: She is not in acute distress.    Appearance: Normal appearance. She is not toxic-appearing or diaphoretic.  HENT:     Head: Normocephalic and atraumatic.     Left Ear: Tympanic membrane and external ear normal. Drainage and tenderness present. No swelling.  No middle ear effusion. There is no impacted cerumen. No foreign body. No mastoid tenderness. Tympanic membrane is not perforated, erythematous or bulging.     Ears:     Comments: Patient has erythema and mild purulent drainage present to bottom of left external canal.  Minimal swelling noted.  TM appears normal and intact. Eyes:     Extraocular Movements: Extraocular movements intact.     Conjunctiva/sclera: Conjunctivae normal.  Pulmonary:     Effort: Pulmonary effort is normal.  Neurological:     General: No focal deficit present.     Mental Status: She is alert and oriented to person, place, and time. Mental status is at baseline.  Psychiatric:        Mood and Affect: Mood normal.        Behavior: Behavior normal.        Thought Content: Thought content normal.        Judgment: Judgment normal.      UC Treatments / Results  Labs (all labs ordered are listed, but only abnormal results are displayed) Labs Reviewed - No data to display  EKG   Radiology No results found.  Procedures Procedures (including critical care time)  Medications Ordered in UC Medications - No data to display  Initial Impression / Assessment and Plan / UC Course  I have reviewed the triage vital  signs and the nursing notes.  Pertinent labs & imaging results that were available during my care of the patient were reviewed  by me and considered in my medical decision making (see chart for details).     Left ear physical exam appears consistent with left otitis externa.  Will treat with Ciprodex drops.  TM appears intact and normal.  Advised patient to stop using ear tool to scratch ear.  Advised patient that it may be best to follow-up with ENT specialist given that she has a chronic itchiness of her ear for further evaluation.  She was provided with contact information and advised to follow-up.  Discussed return precautions.  Patient verbalized understanding and was agreeable with plan. Final Clinical Impressions(s) / UC Diagnoses   Final diagnoses:  Infective otitis externa of left ear     Discharge Instructions      I am treating you for infection of the canal of your ear with antibiotic eardrops.  Please follow-up with ear, nose, throat specialist on Monday to schedule appointment for further evaluation and management.    ED Prescriptions     Medication Sig Dispense Auth. Provider   ciprofloxacin-dexamethasone (CIPRODEX) OTIC suspension Place 4 drops into the left ear 2 (two) times daily for 7 days. 7.5 mL Teodora Medici, [place]      PDMP not reviewed this encounter.   Teodora Medici, [place] 01/05/22 [phone]

## 2022-01-05 NOTE — ED Triage Notes (Signed)
Pt c/o left ear pressure about 2 weeks ago that had gotten better but over the last few days has gotten much worse with trouble hearing and possible discharge.

## 2022-01-14 ENCOUNTER — Ambulatory Visit: Payer: 59 | Admitting: Family Medicine

## 2022-01-24 ENCOUNTER — Other Ambulatory Visit (HOSPITAL_COMMUNITY): Payer: Self-pay

## 2022-01-24 ENCOUNTER — Encounter: Payer: Self-pay | Admitting: Family Medicine

## 2022-01-24 ENCOUNTER — Telehealth: Payer: 59 | Admitting: Family Medicine

## 2022-01-24 VITALS — Ht 71.0 in | Wt 198.0 lb

## 2022-01-24 DIAGNOSIS — R059 Cough, unspecified: Secondary | ICD-10-CM | POA: Diagnosis not present

## 2022-01-24 DIAGNOSIS — R600 Localized edema: Secondary | ICD-10-CM | POA: Diagnosis not present

## 2022-01-24 DIAGNOSIS — U071 COVID-19: Secondary | ICD-10-CM | POA: Diagnosis not present

## 2022-01-24 DIAGNOSIS — J3489 Other specified disorders of nose and nasal sinuses: Secondary | ICD-10-CM | POA: Diagnosis not present

## 2022-01-24 DIAGNOSIS — R0982 Postnasal drip: Secondary | ICD-10-CM

## 2022-01-24 DIAGNOSIS — R4589 Other symptoms and signs involving emotional state: Secondary | ICD-10-CM | POA: Diagnosis not present

## 2022-01-24 DIAGNOSIS — F419 Anxiety disorder, unspecified: Secondary | ICD-10-CM | POA: Diagnosis not present

## 2022-01-24 DIAGNOSIS — F439 Reaction to severe stress, unspecified: Secondary | ICD-10-CM | POA: Diagnosis not present

## 2022-01-24 MED ORDER — IPRATROPIUM BROMIDE 0.06 % NA SOLN
1.0000 | Freq: Four times a day (QID) | NASAL | 1 refills | Status: AC
  Filled 2022-01-24: qty 45, 32d supply, fill #0

## 2022-01-24 MED ORDER — FLUOXETINE HCL 20 MG PO CAPS
20.0000 mg | ORAL_CAPSULE | Freq: Every day | ORAL | 2 refills | Status: AC
  Filled 2022-01-24: qty 90, 90d supply, fill #0
  Filled 2022-05-08 – 2022-06-21 (×2): qty 90, 90d supply, fill #1

## 2022-01-24 NOTE — Progress Notes (Signed)
Virtual Visit via Video Note  I connected with Candice Pratt on 01/24/22 at 3:39 PM by a video enabled telemedicine application and verified that I am speaking with the correct person using two identifiers.  Patient location: home, by self. My location: office - [place].    I discussed the limitations, risks, security and privacy concerns of performing an evaluation and management service by telephone and the availability of in person appointments. I also discussed with the patient that there may be a patient responsible charge related to this service. The patient expressed understanding and agreed to proceed, consent obtained  Chief complaint:  Chief Complaint  Patient presents with   Follow-up    Pt states she has had some swelling in her ankles. Pt states she is doing good on the prozac    History of Present Illness: Candice Pratt is a 56 y.o. female  Anxiety with situational stress Last discussed in September, improved with start of Prozac previously.  Also recent start of hormone treatment was thought to provide some potential benefit as well.  Continued same dose. Doing well. Not worrying and able to deal with stressors better. No new side effects.  Hot flushes have improved with estrogen patch.   Covid infection Sx's started 5 days ago. HA, body ache, nasal congestion, dry cough, sore throat., not in chest. No CP/dyspnea/confusion. Eating/drinking ok. Improving today.  Tx: tylenol, cough suppressant, dayquil, nyquil, tessalon perles.   Had initial vaccine, covid infection in August. Planned on booster, but delayed d/t recent infection. Out of work for now.  Hx of rhinitis, needs 3 month refill to have in various areas works well.   Ankle swelling Discussed in August.  Some sock line edema at times, not as bad as this summer.  Resolves overnight.  No CP/dypsnea or UE swelling.  Avoiding nsaids, more water.  Lab Results  Component Value Date   CREATININE  0.97 09/30/2021  Prior 1.26   Patient Active Problem List   Diagnosis Date Noted   Spondylolysis, lumbar region 10/07/2017   Lymphadenopathy, inguinal 08/13/2017   Lateral epicondylitis of right elbow 01/04/2017   DERMATITIS HERPETIFORMIS 05/08/2009   TOBACCO ABUSE 03/31/2009   HEMORRHOIDS, EXTERNAL 03/31/2009   FATIGUE 03/31/2009   Past Medical History:  Diagnosis Date   Asthma    Bronchitis, chronic (HCC)    GERD (gastroesophageal reflux disease)    Hiatal hernia    Spondylolisthesis of lumbar region    Stress incontinence    Wears contact lenses    Past Surgical History:  Procedure Laterality Date   ESOPHAGOGASTRODUODENOSCOPY     KNEE SURGERY     SPINE SURGERY N/A    Phreesia 09/23/2019   Allergies  Allergen Reactions   Beeswax Swelling    When applied topically   Chlorhexidine Gluconate Itching   Prior to Admission medications   Medication Sig Start Date End Date Taking? Authorizing Provider  acetaminophen (TYLENOL) 500 MG tablet Take 1,000 mg by mouth every 4 (four) hours as needed.    Yes [provider]  albuterol (VENTOLIN HFA) 108 (90 Base) MCG/ACT inhaler Inhale 1-2 puffs into the lungs every 6 (six) hours as needed for wheezing or shortness of breath. 09/20/21  Yes [place], Hildred Alamin E, FNP  calcium carbonate (TUMS - DOSED IN MG ELEMENTAL CALCIUM) 500 MG chewable tablet Chew 1 tablet by mouth daily.   Yes [provider]  estradiol (VIVELLE-DOT) 0.05 MG/24HR patch Place 1 patch (0.05 mg total) onto the skin 2 (  two) times a week. 11/05/21  Yes Jerelyn Charles, MD  FLUoxetine (PROZAC) 20 MG capsule Take 1 capsule (20 mg total) by mouth daily. 10/12/21  Yes Wendie Agreste, MD  ipratropium (ATROVENT) 0.06 % nasal spray Place 1-2 sprays into both nostrils 4 (four) times daily. As needed for nasal congestion 09/05/21  Yes Wendie Agreste, MD  levonorgestrel [facility]) 20 MCG/24HR IUD 1 each by Intrauterine route once.    Yes [provider]   methocarbamol (ROBAXIN-750) 750 MG tablet Take 1 tablet (750 mg total) by mouth every 6 (six) hours as needed for muscle spasms. 10/27/19  Yes Maximiano Coss, NP   Social History   Socioeconomic History   Marital status: Married    Spouse name: Not on file   Number of children: 3   Years of education: Not on file   Highest education level: Not on file  Occupational History    Employer:   Tobacco Use   Smoking status: Every Day    Packs/day: 0.50    Years: 20.00    Total pack years: 10.00    Types: Cigarettes   Smokeless tobacco: Never  Vaping Use   Vaping Use: Never used  Substance and Sexual Activity   Alcohol use: Yes    Alcohol/week: 1.0 standard drink of alcohol    Types: 1 Glasses of wine per week   Drug use: No   Sexual activity: Yes    Birth control/protection: I.U.D.  Other Topics Concern   Not on file  Social History Narrative   Not on file   Social Determinants of Health   Financial Resource Strain: Not on file  Food Insecurity: Not on file  Transportation Needs: Not on file  Physical Activity: Not on file  Stress: Not on file  Social Connections: Not on file  Intimate Partner Violence: Not on file    Observations/Objective: Vitals:   01/24/22 1456  Weight: 198 lb (89.8 kg)  Height: '5\' 11"'$  (1.803 m)  Nontoxic appearance on video, speaking in full sentences, euthymic mood.  No respiratory distress, audible wheeze or stridor.   Assessment and Plan: Anxiety Situational stress - Plan: FLUoxetine (PROZAC) 20 MG capsule Depressed mood - Plan: FLUoxetine (PROZAC) 20 MG capsule  -Improved, stable with current dose fluoxetine, continue same.  Recheck 6 months  COVID-19 virus infection  -Stable with home treatment, after visit summary with CDC guidelines on masking/isolation.  Pedal edema  -Sock line edema, resolves overnight, reassuring.  Previous labs reviewed with improved renal function.  RTC precautions if worsening  symptoms.  Postnasal drip - Plan: ipratropium (ATROVENT) 0.06 % nasal spray Rhinorrhea - Plan: ipratropium (ATROVENT) 0.06 % nasal spray Cough, unspecified type - Plan: ipratropium (ATROVENT) 0.06 % nasal spray  -Stable with intermittent Atrovent nasal spray, refilled for 79-monthsupply to allow multiple prescriptions for various areas of need.  RTC precautions.  Follow Up Instructions: 6 months.   I discussed the assessment and treatment plan with the patient. The patient was provided an opportunity to ask questions and all were answered. The patient agreed with the plan and demonstrated an understanding of the instructions.   The patient was advised to call back or seek an in-person evaluation if the symptoms worsen or if the condition fails to improve as anticipated.   JWendie Agreste MD

## 2022-01-24 NOTE — Patient Instructions (Addendum)
Ok to continue same dose of fluoxetine. Glad to hear you are doing better.   Atrovent nasal spray refilled.  Sock line edema that improves overnight is not typically concerning.  Follow-up if you see that worsen or upper extremity swelling.  Okay to continue same medications for COVID infection, glad to hear that is improving.   Your employer may have specific requirements for return to work, but this information is provided from the CDC. There is a link provided below for more information if needed.   Everyone who has presumed or confirmed COVID-19 should stay home and isolate from other people for at least 5 full days (day 0 is the first day of symptoms or the date of the day of the positive viral test for asymptomatic persons). You can end isolation after 5 full days if you are fever-free for 24 hours without the use of fever-reducing medication and your other symptoms have improved (Loss of taste and smell may persist for weeks or months after recovery and need not delay the end of isolation). You should continue to wear a well-fitting mask around others at home and in public for 5 additional days (day 6 through day 10) after the end of your 5-day isolation period. If you are unable to wear a mask when around others, you should continue to isolate for a full 10 days. Avoid people who have weakened immune systems or are more likely to get very sick from COVID-19, and nursing homes and other high-risk settings, until after at least 10 days.  If you continue to have fever or your other symptoms have not improved after 5 days of isolation, you should wait to end your isolation until you are fever-free for 24 hours without the use of fever-reducing medication and your other symptoms have improved. Continue to wear a well-fitting mask through day 10.   https://brown.org/.html

## 2022-03-14 ENCOUNTER — Other Ambulatory Visit (HOSPITAL_COMMUNITY): Payer: Self-pay

## 2022-03-14 ENCOUNTER — Other Ambulatory Visit (INDEPENDENT_AMBULATORY_CARE_PROVIDER_SITE_OTHER): Payer: 59

## 2022-03-14 ENCOUNTER — Ambulatory Visit (INDEPENDENT_AMBULATORY_CARE_PROVIDER_SITE_OTHER): Payer: 59 | Admitting: Internal Medicine

## 2022-03-14 ENCOUNTER — Encounter: Payer: Self-pay | Admitting: Internal Medicine

## 2022-03-14 VITALS — BP 110/70 | HR 80 | Ht 70.0 in | Wt 212.0 lb

## 2022-03-14 DIAGNOSIS — K219 Gastro-esophageal reflux disease without esophagitis: Secondary | ICD-10-CM

## 2022-03-14 DIAGNOSIS — R1111 Vomiting without nausea: Secondary | ICD-10-CM

## 2022-03-14 DIAGNOSIS — Z1211 Encounter for screening for malignant neoplasm of colon: Secondary | ICD-10-CM

## 2022-03-14 DIAGNOSIS — R140 Abdominal distension (gaseous): Secondary | ICD-10-CM

## 2022-03-14 LAB — CBC WITH DIFFERENTIAL/PLATELET
Basophils Absolute: 0.1 10*3/uL (ref 0.0–0.1)
Basophils Relative: 0.9 % (ref 0.0–3.0)
Eosinophils Absolute: 0.1 10*3/uL (ref 0.0–0.7)
Eosinophils Relative: 1.4 % (ref 0.0–5.0)
HCT: 40.1 % (ref 36.0–46.0)
Hemoglobin: 13.7 g/dL (ref 12.0–15.0)
Lymphocytes Relative: 28.5 % (ref 12.0–46.0)
Lymphs Abs: 1.7 10*3/uL (ref 0.7–4.0)
MCHC: 34.1 g/dL (ref 30.0–36.0)
MCV: 88.9 fl (ref 78.0–100.0)
Monocytes Absolute: 0.4 10*3/uL (ref 0.1–1.0)
Monocytes Relative: 7 % (ref 3.0–12.0)
Neutro Abs: 3.7 10*3/uL (ref 1.4–7.7)
Neutrophils Relative %: 62.2 % (ref 43.0–77.0)
Platelets: 280 10*3/uL (ref 150.0–400.0)
RBC: 4.51 Mil/uL (ref 3.87–5.11)
RDW: 13.8 % (ref 11.5–15.5)
WBC: 6 10*3/uL (ref 4.0–10.5)

## 2022-03-14 LAB — COMPREHENSIVE METABOLIC PANEL
ALT: 29 U/L (ref 0–35)
AST: 18 U/L (ref 0–37)
Albumin: 4.6 g/dL (ref 3.5–5.2)
Alkaline Phosphatase: 23 U/L — ABNORMAL LOW (ref 39–117)
BUN: 18 mg/dL (ref 6–23)
CO2: 29 mEq/L (ref 19–32)
Calcium: 10.6 mg/dL — ABNORMAL HIGH (ref 8.4–10.5)
Chloride: 99 mEq/L (ref 96–112)
Creatinine, Ser: 0.91 mg/dL (ref 0.40–1.20)
GFR: 71.55 mL/min (ref >60.00)
Glucose, Bld: 91 mg/dL (ref 70–99)
Potassium: 4.2 mEq/L (ref 3.5–5.1)
Sodium: 139 mEq/L (ref 135–145)
Total Bilirubin: 0.4 mg/dL (ref 0.2–1.2)
Total Protein: 7.5 g/dL (ref 6.0–8.3)

## 2022-03-14 LAB — TSH: TSH: 3.03 u[IU]/mL (ref 0.35–5.50)

## 2022-03-14 LAB — HIGH SENSITIVITY CRP: CRP, High Sensitivity: 5.14 mg/L — ABNORMAL HIGH (ref 0.000–5.000)

## 2022-03-14 MED ORDER — PLENVU 140 G PO SOLR
1.0000 | ORAL | 0 refills | Status: DC
  Filled 2022-03-14: qty 3, 2d supply, fill #0

## 2022-03-14 NOTE — Progress Notes (Signed)
Patient ID: Candice Pratt, female   DOB: January 19, 1966, 57 y.o.   MRN: [id number] HPI: Candice Pratt is a 57 year old female with a history of GERD, asthma known to me from evaluation in 2015 for reflux symptoms with hoarseness and epigastric pain who is seen to evaluate abdominal bloating, occasional heartburn and episodes of vomiting.  She is here alone today.  In 2015 she had upper endoscopy which showed mild esophagitis gastritis and duodenitis.  Biopsies from the distal esophagus showed rare eosinophils consistent with reflux.  There was some chronic gastritis in the stomach but no H. pylori infection or metaplasia.  She reports that after being seen here in 2015 she did take PPI and eventually twice daily PPI but never really helped her upper GI and reflux type symptoms.  She then started and adhere to a keto diet which resolved all of her GI symptoms.  This also did lead to weight loss.  However this diet was not sustainable for her in the long-term.  Since going back to a regular type diet she has frequent abdominal bloating which seems to worsen throughout the day.  She feels "9 months pregnant".  She is also gained about 30 pounds since 2018.  She has these episodes of "needley" discomfort in the back of her throat with cough, runny nose and coughing up air and mucus which seems to occur before lunch and dinner.  Gets better after eating.  Tums makes it better slightly.  She will still randomly vomit without nausea.  Worse after waking up and on empty stomach as stated before.  Bowel movements have been very regular.  She is taking famotidine 20 mg 3 times a day before meals.  She is not sure this helps much.  She also takes Zyrtec.  She drinks milk and creamer with her coffee.  She has seen Dr. Harold Hedge in 2019 for swelling at the lips but this was felt secondary to using beeswax lip balm and has resolved after stopping this.  She is not sure if any food allergy testing was done at that time  but she did have skin testing for environmental allergies.  Her children have lactose intolerance.  She has started an estrogen patch for hot flashes.  Past Medical History:  Diagnosis Date   Asthma    Bronchitis, chronic (HCC)    GERD (gastroesophageal reflux disease)    Hiatal hernia    Spondylolisthesis of lumbar region    Stress incontinence    Wears contact lenses     Past Surgical History:  Procedure Laterality Date   ESOPHAGOGASTRODUODENOSCOPY     KNEE SURGERY     SPINE SURGERY N/A    Phreesia 09/23/2019    Outpatient Medications Prior to Visit  Medication Sig Dispense Refill   acetaminophen (TYLENOL) 500 MG tablet Take 1,000 mg by mouth every 4 (four) hours as needed.      albuterol (VENTOLIN HFA) 108 (90 Base) MCG/ACT inhaler Inhale 1-2 puffs into the lungs every 6 (six) hours as needed for wheezing or shortness of breath. 1 each 0   calcium carbonate (TUMS - DOSED IN MG ELEMENTAL CALCIUM) 500 MG chewable tablet Chew 1 tablet by mouth daily.     estradiol (VIVELLE-DOT) 0.05 MG/24HR patch Place 1 patch (0.05 mg total) onto the skin 2 (two) times a week. 24 patch 3   FLUoxetine (PROZAC) 20 MG capsule Take 1 capsule (20 mg total) by mouth daily. 90 capsule 2   ipratropium (ATROVENT) 0.06 %  nasal spray Place 1-2 sprays into both nostrils 4 (four) times daily. As needed for nasal congestion 45 mL 1   levonorgestrel (MIRENA) 20 MCG/24HR IUD 1 each by Intrauterine route once.      methocarbamol (ROBAXIN-750) 750 MG tablet Take 1 tablet (750 mg total) by mouth every 6 (six) hours as needed for muscle spasms. 60 tablet 1   No facility-administered medications prior to visit.    Allergies  Allergen Reactions   Beeswax Swelling    When applied topically   Chlorhexidine Gluconate Itching    Family History  Problem Relation Age of Onset   Diabetes Other    Cancer Other    Cancer Mother    Diabetes Brother    Hypertension Brother    Stroke Maternal Grandmother      Social History   Tobacco Use   Smoking status: Every Day    Packs/day: 0.50    Years: 20.00    Total pack years: 10.00    Types: Cigarettes   Smokeless tobacco: Never  Vaping Use   Vaping Use: Never used  Substance Use Topics   Alcohol use: Yes    Alcohol/week: 1.0 standard drink of alcohol    Types: 1 Glasses of wine per week   Drug use: No    ROS: As per history of present illness, otherwise negative  Ht '5\' 10"'$  (1.778 m) Comment: height measured without shoes  Wt 212 lb (96.2 kg)   BMI 30.42 kg/m  Gen: awake, alert, NAD HEENT: anicteric  CV: RRR, no mrg Pulm: CTA b/l Abd: soft, increased tympany and mild distention, +BS throughout Ext: no c/c/e Neuro: nonfocal  RELEVANT LABS AND IMAGING: CBC    Component Value Date/Time   WBC 8.5 09/30/2021 1211   WBC 4.9 09/05/2021 1722   RBC 5.07 09/30/2021 1211   RBC 4.43 09/05/2021 1722   HGB 14.4 09/30/2021 1211   HCT 45.5 09/30/2021 1211   PLT 370 09/30/2021 1211   MCV 90 09/30/2021 1211   MCH 28.4 09/30/2021 1211   MCH 29.9 10/01/2017 1539   MCHC 31.6 09/30/2021 1211   MCHC 33.7 09/05/2021 1722   RDW 12.8 09/30/2021 1211   LYMPHSABS 1.8 06/23/2013 2140   MONOABS 0.7 06/23/2013 2140   EOSABS 0.2 06/23/2013 2140   BASOSABS 0.1 06/23/2013 2140    CMP     Component Value Date/Time   NA 136 09/30/2021 1211   K 4.9 09/30/2021 1211   CL 95 (L) 09/30/2021 1211   CO2 22 09/30/2021 1211   GLUCOSE 83 09/30/2021 1211   GLUCOSE 88 09/05/2021 1722   BUN 14 09/30/2021 1211   CREATININE 0.97 09/30/2021 1211   CALCIUM 10.7 (H) 09/30/2021 1211   PROT 7.1 09/30/2021 1211   ALBUMIN 4.8 09/30/2021 1211   AST 22 09/30/2021 1211   ALT 32 09/30/2021 1211   ALKPHOS 36 (L) 09/30/2021 1211   BILITOT 0.2 09/30/2021 1211   GFRNONAA >60 10/01/2017 1539   GFRAA >60 10/01/2017 1539    ASSESSMENT/PLAN: 57 year old female with a history of GERD, asthma known to me from evaluation in 2015 for reflux symptoms with hoarseness  and epigastric pain who is seen to evaluate abdominal bloating, occasional heartburn and episodes of vomiting.   Vomiting/abdominal bloating/occasional heartburn --she does have GERD but her acid should be well suppressed with relatively high-dose famotidine 20 mg 3 times daily.  I am suspicious for a food allergy or even celiac disease given her abdominal bloating.  When she stopped all  gluten during her keto diet symptoms resolved entirely.  Plan as follows: -- TTG and IgA; check TSH; check CRP; repeat blood and liver/kidney panel -- Upper endoscopy -- If unrevealing consider SIBO testing and referral back to allergy and immunology for food allergy testing specifically -- Consider trial of lactose-free diet if workup unrevealing  2.  Colon cancer screening --no prior colonoscopy.  Colonoscopy recommended.  We reviewed the risk, benefits and alternatives to both upper and lower endoscopy and she is agreeable and wishes to proceed -- [facility]     CE:YEMVVK, Ranell Patrick, Md [address] [place],  [place] [id number]

## 2022-03-14 NOTE — Patient Instructions (Signed)
You have been scheduled for an endoscopy and colonoscopy. Please follow the written instructions given to you at your visit today. Please pick up your prep supplies at the pharmacy within the next 1-3 days. If you use inhalers (even only as needed), please bring them with you on the day of your procedure.  Your provider has requested that you go to the basement level for lab work before leaving today. Press "B" on the elevator. The lab is located at the first door on the left as you exit the elevator.  _______________________________________________________  If your blood pressure at your visit was 140/90 or greater, please contact your primary care physician to follow up on this.  _______________________________________________________  If you are age 22 or older, your body mass index should be between 23-30. Your Body mass index is 30.42 kg/m. If this is out of the aforementioned range listed, please consider follow up with your Primary Care Provider.  If you are age 18 or younger, your body mass index should be between 19-25. Your Body mass index is 30.42 kg/m. If this is out of the aformentioned range listed, please consider follow up with your Primary Care Provider.   ________________________________________________________  The [facility] providers would like to encourage you to use [facility] to communicate with providers for non-urgent requests or questions.  Due to long hold times on the telephone, sending your provider a message by [facility] may be a faster and more efficient way to get a response.  Please allow 48 business hours for a response.  Please remember that this is for non-urgent requests.  _______________________________________________________  Due to recent changes in healthcare laws, you may see the results of your imaging and laboratory studies on MyChart before your provider has had a chance to review them.  We understand that in some cases there may be results that are  confusing or concerning to you. Not all laboratory results come back in the same time frame and the provider may be waiting for multiple results in order to interpret others.  Please give [place] 48 hours in order for your provider to thoroughly review all the results before contacting the office for clarification of your results.

## 2022-03-15 LAB — TISSUE TRANSGLUTAMINASE, IGA: (tTG) Ab, IgA: <1 U/mL

## 2022-03-15 LAB — IGA: Immunoglobulin A: 100 mg/dL (ref 47–310)

## 2022-05-07 ENCOUNTER — Encounter: Payer: 59 | Admitting: Internal Medicine

## 2022-05-08 ENCOUNTER — Telehealth: Payer: Self-pay | Admitting: Internal Medicine

## 2022-05-08 ENCOUNTER — Encounter: Payer: Self-pay | Admitting: Internal Medicine

## 2022-05-08 ENCOUNTER — Ambulatory Visit (AMBULATORY_SURGERY_CENTER): Payer: 59 | Admitting: Internal Medicine

## 2022-05-08 VITALS — BP 99/66 | HR 62 | Temp 98.0°F | Resp 14 | Ht 70.0 in | Wt 212.0 lb

## 2022-05-08 DIAGNOSIS — K635 Polyp of colon: Secondary | ICD-10-CM | POA: Diagnosis not present

## 2022-05-08 DIAGNOSIS — D125 Benign neoplasm of sigmoid colon: Secondary | ICD-10-CM

## 2022-05-08 DIAGNOSIS — D124 Benign neoplasm of descending colon: Secondary | ICD-10-CM

## 2022-05-08 DIAGNOSIS — R1111 Vomiting without nausea: Secondary | ICD-10-CM

## 2022-05-08 DIAGNOSIS — Z1211 Encounter for screening for malignant neoplasm of colon: Secondary | ICD-10-CM

## 2022-05-08 DIAGNOSIS — D128 Benign neoplasm of rectum: Secondary | ICD-10-CM | POA: Diagnosis not present

## 2022-05-08 DIAGNOSIS — D123 Benign neoplasm of transverse colon: Secondary | ICD-10-CM | POA: Diagnosis not present

## 2022-05-08 DIAGNOSIS — J45909 Unspecified asthma, uncomplicated: Secondary | ICD-10-CM | POA: Diagnosis not present

## 2022-05-08 DIAGNOSIS — F419 Anxiety disorder, unspecified: Secondary | ICD-10-CM | POA: Diagnosis not present

## 2022-05-08 DIAGNOSIS — D122 Benign neoplasm of ascending colon: Secondary | ICD-10-CM

## 2022-05-08 DIAGNOSIS — R140 Abdominal distension (gaseous): Secondary | ICD-10-CM

## 2022-05-08 DIAGNOSIS — R12 Heartburn: Secondary | ICD-10-CM | POA: Diagnosis not present

## 2022-05-08 MED ORDER — SODIUM CHLORIDE 0.9 % IV SOLN: 500.0000 mL | Freq: Once | INTRAVENOUS | Status: DC

## 2022-05-08 NOTE — Progress Notes (Signed)
Sedate, gd SR, tolerated procedure well, VSS, report to RN 

## 2022-05-08 NOTE — Op Note (Signed)
[place] Patient Name: Jaylianie Rosiak Procedure Date: 05/08/2022 2:02 PM MRN: [id number] Endoscopist: Jerene Bears , MD, [id number] Age: 57 Referring MD:  Date of Birth: [birth year] Gender: Female Account #: [id number] Procedure:                Upper GI endoscopy Indications:              Heartburn, Abdominal bloating, Nausea with vomiting                            -- all despite famotidine 20 mg TID Medicines:                Monitored Anesthesia Care Procedure:                Pre-Anesthesia Assessment:                           - Prior to the procedure, a History and Physical                            was performed, and patient medications and                            allergies were reviewed. The patient's tolerance of                            previous anesthesia was also reviewed. The risks                            and benefits of the procedure and the sedation                            options and risks were discussed with the patient.                            All questions were answered, and informed consent                            was obtained. Prior Anticoagulants: The patient has                            taken no anticoagulant or antiplatelet agents. ASA                            Grade Assessment: II - A patient with mild systemic                            disease. After reviewing the risks and benefits,                            the patient was deemed in satisfactory condition to                            undergo the procedure.  After obtaining informed consent, the endoscope was                            passed under direct vision. Throughout the                            procedure, the patient's blood pressure, pulse, and                            oxygen saturations were monitored continuously. The                            Olympus Scope [id number] was introduced through the                            mouth, and  advanced to the second part of duodenum.                            The upper GI endoscopy was accomplished without                            difficulty. The patient tolerated the procedure                            well. Scope In: Scope Out: Findings:                 Mild inflammation characterized by granularity was                            found in the lower third of the esophagus. Biopsies                            were taken with a cold forceps for histology.                           The gastroesophageal flap valve was visualized                            endoscopically and classified as Hill Grade III                            (minimal fold, loose to endoscope, hiatal hernia                            likely).                           The entire examined stomach was normal. Biopsies                            were taken with a cold forceps for histology and                            Helicobacter pylori testing.  The examined duodenum was normal. Biopsies for                            histology were taken with a cold forceps for                            evaluation of celiac disease. Complications:            No immediate complications. Estimated Blood Loss:     Estimated blood loss was minimal. Impression:               - Esophageal mucosal changes were present,                            including granularity. Findings are suggestive of                            reflux inflammation. Biopsied.                           - Normal stomach. Biopsied.                           - Normal examined duodenum. Biopsied. Recommendation:           - Patient has a contact number available for                            emergencies. The signs and symptoms of potential                            delayed complications were discussed with the                            patient. Return to normal activities tomorrow.                            Written discharge  instructions were provided to the                            patient.                           - Resume previous diet.                           - Continue present medications.                           - Await pathology results. If unrevealing proceed                            with SIBO breath testing.                           - See the other procedure note for documentation of  additional recommendations.                           - Office follow-up, routine with me. Jerene Bears, MD 05/08/2022 2:52:44 PM This report has been signed electronically.

## 2022-05-08 NOTE — Progress Notes (Signed)
GASTROENTEROLOGY PROCEDURE H&P NOTE   Primary Care Physician: Wendie Agreste, MD    Reason for Procedure:  Vomiting, bloating, occasional heartburn and colon cancer screening  Plan:    EGD and colonoscopy  Patient is appropriate for endoscopic procedure(s) in the ambulatory ([place]) setting.  The nature of the procedure, as well as the risks, benefits, and alternatives were carefully and thoroughly reviewed with the patient. Ample time for discussion and questions allowed. The patient understood, was satisfied, and agreed to proceed.     HPI: Candice Pratt is a 57 y.o. female who presents for EGD and colonoscopy.  Medical history as below.  Tolerated the prep.  No recent chest pain or shortness of breath.  No abdominal pain today.  Past Medical History:  Diagnosis Date   Anxiety    Asthma    Bronchitis, chronic    GERD (gastroesophageal reflux disease)    Hiatal hernia    Melanoma    Peptic ulcer    Spondylolisthesis of lumbar region    Stress incontinence    Wears contact lenses     Past Surgical History:  Procedure Laterality Date   ESOPHAGOGASTRODUODENOSCOPY     KNEE ARTHROSCOPY Left 2004   LUMBAR FUSION N/A 2019   L5-S1    Prior to Admission medications   Medication Sig Start Date End Date Taking? Authorizing Provider  acetaminophen (TYLENOL) 500 MG tablet Take 1,000 mg by mouth every 4 (four) hours as needed.    Yes [provider]  calcium carbonate (TUMS - DOSED IN MG ELEMENTAL CALCIUM) 500 MG chewable tablet Chew 1 tablet by mouth daily.   Yes [provider]  cetirizine (ZYRTEC) 10 MG tablet Take 10 mg by mouth daily.   Yes [provider]  estradiol (VIVELLE-DOT) 0.05 MG/24HR patch Place 1 patch (0.05 mg total) onto the skin 2 (two) times a week. 11/05/21  Yes Jerelyn Charles, MD  famotidine (PEPCID) 20 MG tablet Take 20 mg by mouth 3 (three) times daily before meals.   Yes [provider]  FLUoxetine (PROZAC) 20 MG  capsule Take 1 capsule (20 mg total) by mouth daily. 01/24/22  Yes Wendie Agreste, MD  ipratropium (ATROVENT) 0.06 % nasal spray Place 1-2 sprays into both nostrils 4 (four) times daily. As needed for nasal congestion Patient not taking: Reported on 05/08/2022 01/24/22   Wendie Agreste, MD  levonorgestrel [facility]) 20 MCG/24HR IUD 1 each by Intrauterine route once.     [provider]  methocarbamol (ROBAXIN-750) 750 MG tablet Take 1 tablet (750 mg total) by mouth every 6 (six) hours as needed for muscle spasms. Patient not taking: Reported on 05/08/2022 10/27/19   Maximiano Coss, NP    Current Outpatient Medications  Medication Sig Dispense Refill   acetaminophen (TYLENOL) 500 MG tablet Take 1,000 mg by mouth every 4 (four) hours as needed.      calcium carbonate (TUMS - DOSED IN MG ELEMENTAL CALCIUM) 500 MG chewable tablet Chew 1 tablet by mouth daily.     cetirizine (ZYRTEC) 10 MG tablet Take 10 mg by mouth daily.     estradiol (VIVELLE-DOT) 0.05 MG/24HR patch Place 1 patch (0.05 mg total) onto the skin 2 (two) times a week. 24 patch 3   famotidine (PEPCID) 20 MG tablet Take 20 mg by mouth 3 (three) times daily before meals.     FLUoxetine (PROZAC) 20 MG capsule Take 1 capsule (20 mg total) by mouth daily. 90 capsule 2  ipratropium (ATROVENT) 0.06 % nasal spray Place 1-2 sprays into both nostrils 4 (four) times daily. As needed for nasal congestion (Patient not taking: Reported on 05/08/2022) 45 mL 1   levonorgestrel (MIRENA) 20 MCG/24HR IUD 1 each by Intrauterine route once.      methocarbamol (ROBAXIN-750) 750 MG tablet Take 1 tablet (750 mg total) by mouth every 6 (six) hours as needed for muscle spasms. (Patient not taking: Reported on 05/08/2022) 60 tablet 1   Current Facility-Administered Medications  Medication Dose Route Frequency Provider Last Rate Last Admin   0.9 %  sodium chloride infusion  500 mL Intravenous Once Taylan Marez, Lajuan Lines, MD        Allergies as of 05/08/2022 -  Review Complete 05/08/2022  Allergen Reaction Noted   Beeswax Swelling 09/24/2017   Chlorhexidine gluconate Itching 10/01/2017    Family History  Problem Relation Age of Onset   Cancer Mother    Diabetes Brother    Hypertension Brother    Kidney disease Brother    Stroke Maternal Grandmother    Autism Son        transgender   Lactose intolerance Son    Lactose intolerance Son    Diabetes Other    Cancer Other     Social History   Socioeconomic History   Marital status: Married    Spouse name: Not on file   Number of children: 3   Years of education: Not on file   Highest education level: Not on file  Occupational History    Employer: [employer]   Occupation: Hospital Dept Director  Tobacco Use   Smoking status: Every Day    Packs/day: 0.50    Years: 20.00    Additional pack years: 0.00    Total pack years: 10.00    Types: Cigarettes   Smokeless tobacco: Never  Vaping Use   Vaping Use: Never used  Substance and Sexual Activity   Alcohol use: Yes    Alcohol/week: 1.0 standard drink of alcohol    Types: 1 Glasses of wine per week    Comment: occsional   Drug use: No   Sexual activity: Yes    Birth control/protection: I.U.D.  Other Topics Concern   Not on file  Social History Narrative   Not on file   Social Determinants of Health   Financial Resource Strain: Not on file  Food Insecurity: Not on file  Transportation Needs: Not on file  Physical Activity: Not on file  Stress: Not on file  Social Connections: Not on file  Intimate Partner Violence: Not on file    Physical Exam: Vital signs in last 24 hours: @BP  105/62   Pulse 78   Temp 98 F (36.7 C)   Ht 5\' 10"  (1.778 m)   Wt 212 lb (96.2 kg)   SpO2 97%   BMI 30.42 kg/m  GEN: NAD EYE: Sclerae anicteric ENT: MMM CV: Non-tachycardic Pulm: CTA b/l GI: Soft, NT/ND NEURO:  Alert & Oriented x 3   Zenovia Jarred, MD [facility]  05/08/2022 2:06 PM

## 2022-05-08 NOTE — Patient Instructions (Signed)
YOU HAD AN ENDOSCOPIC PROCEDURE TODAY AT THE [facility]:   Refer to the procedure report that was given to you for any specific questions about what was found during the examination.  If the procedure report does not answer your questions, please call your gastroenterologist to clarify.  If you requested that your care partner not be given the details of your procedure findings, then the procedure report has been included in a sealed envelope for you to review at your convenience later.  YOU SHOULD EXPECT: Some feelings of bloating in the abdomen. Passage of more gas than usual.  Walking can help get rid of the air that was put into your GI tract during the procedure and reduce the bloating. If you had a lower endoscopy (such as a colonoscopy or flexible sigmoidoscopy) you may notice spotting of blood in your stool or on the toilet paper. If you underwent a bowel prep for your procedure, you may not have a normal bowel movement for a few days.  Please Note:  You might notice some irritation and congestion in your nose or some drainage.  This is from the oxygen used during your procedure.  There is no need for concern and it should clear up in a day or so.  SYMPTOMS TO REPORT IMMEDIATELY:  Following lower endoscopy (colonoscopy or flexible sigmoidoscopy):  Excessive amounts of blood in the stool  Significant tenderness or worsening of abdominal pains  Swelling of the abdomen that is new, acute  Fever of 100F or higher  Following upper endoscopy (EGD)  Vomiting of blood or coffee ground material  New chest pain or pain under the shoulder blades  Painful or persistently difficult swallowing  New shortness of breath  Fever of 100F or higher  Black, tarry-looking stools  For urgent or emergent issues, a gastroenterologist can be reached at any hour by calling [phone]. Do not use MyChart messaging for urgent concerns.    DIET:  We do recommend a small meal at first, but  then you may proceed to your regular diet.  Drink plenty of fluids but you should avoid alcoholic beverages for 24 hours.  ACTIVITY:  You should plan to take it easy for the rest of today and you should NOT DRIVE or use heavy machinery until tomorrow (because of the sedation medicines used during the test).    FOLLOW UP: Our staff will call the number listed on your records the next business day following your procedure.  We will call around 7:15- 8:00 am to check on you and address any questions or concerns that you may have regarding the information given to you following your procedure. If we do not reach you, we will leave a message.     If any biopsies were taken you will be contacted by phone or by letter within the next 1-3 weeks.  Please call us at [phone] if you have not heard about the biopsies in 3 weeks.    SIGNATURES/CONFIDENTIALITY: You and/or your care partner have signed paperwork which will be entered into your electronic medical record.  These signatures attest to the fact that that the information above on your After Visit Summary has been reviewed and is understood.  Full responsibility of the confidentiality of this discharge information lies with you and/or your care-partner. 

## 2022-05-08 NOTE — Progress Notes (Signed)
Called to room to assist during endoscopic procedure.  Patient ID and intended procedure confirmed with present staff. Received instructions for my participation in the procedure from the performing physician.  

## 2022-05-08 NOTE — Op Note (Signed)
[place] Patient Name: Candice Pratt Procedure Date: 05/08/2022 1:55 PM MRN: [id number] Endoscopist: Jerene Bears , MD, [id number] Age: 57 Referring MD:  Date of Birth: 1965/05/25 Gender: Female Account #: [id number] Procedure:                Colonoscopy Indications:              Screening for colorectal malignant neoplasm Medicines:                Monitored Anesthesia Care Procedure:                Pre-Anesthesia Assessment:                           - Prior to the procedure, a History and Physical                            was performed, and patient medications and                            allergies were reviewed. The patient's tolerance of                            previous anesthesia was also reviewed. The risks                            and benefits of the procedure and the sedation                            options and risks were discussed with the patient.                            All questions were answered, and informed consent                            was obtained. Prior Anticoagulants: The patient has                            taken no anticoagulant or antiplatelet agents. ASA                            Grade Assessment: II - A patient with mild systemic                            disease. After reviewing the risks and benefits,                            the patient was deemed in satisfactory condition to                            undergo the procedure.                           After obtaining informed consent, the colonoscope  was passed under direct vision. Throughout the                            procedure, the patient's blood pressure, pulse, and                            oxygen saturations were monitored continuously. The                            [id number]-[id number] Colonoscope [id number] was introduced                            through the anus and advanced to the cecum,                            identified by appendiceal  orifice and ileocecal                            valve. The colonoscopy was performed without                            difficulty. The patient tolerated the procedure                            well. The quality of the bowel preparation was                            good. The ileocecal valve, appendiceal orifice, and                            rectum were photographed. Scope In: 2:22:10 PM Scope Out: 2:48:38 PM Scope Withdrawal Time: 0 hours 20 minutes 43 seconds  Total Procedure Duration: 0 hours 26 minutes 28 seconds  Findings:                 The digital rectal exam was normal.                           Three sessile polyps were found in the ascending                            colon. The polyps were 4 to 8 mm in size. These                            polyps were removed with a cold snare. Resection                            and retrieval were complete.                           Two sessile polyps were found in the transverse                            colon. The polyps were 5 to 6 mm in size.  These                            polyps were removed with a cold snare. Resection                            and retrieval were complete.                           Three sessile polyps were found in the descending                            colon. The polyps were 4 to 6 mm in size. These                            polyps were removed with a cold snare. Resection                            and retrieval were complete.                           A 5 mm polyp was found in the sigmoid colon. The                            polyp was sessile. The polyp was removed with a                            cold snare. Resection and retrieval were complete.                           An 8 mm polyp was found in the rectum. The polyp                            was sessile. The polyp was removed with a cold                            snare. Resection and retrieval were complete.                           The  retroflexed view of the distal rectum and anal                            verge was normal and showed no anal or rectal                            abnormalities. Complications:            No immediate complications. Estimated Blood Loss:     Estimated blood loss was minimal. Impression:               - Three 4 to 8 mm polyps in the ascending colon,                            removed with  a cold snare. Resected and retrieved.                           - Two 5 to 6 mm polyps in the transverse colon,                            removed with a cold snare. Resected and retrieved.                           - Three 4 to 6 mm polyps in the descending colon,                            removed with a cold snare. Resected and retrieved.                           - One 5 mm polyp in the sigmoid colon, removed with                            a cold snare. Resected and retrieved.                           - One 8 mm polyp in the rectum, removed with a cold                            snare. Resected and retrieved.                           - The distal rectum and anal verge are normal on                            retroflexion view. Recommendation:           - Patient has a contact number available for                            emergencies. The signs and symptoms of potential                            delayed complications were discussed with the                            patient. Return to normal activities tomorrow.                            Written discharge instructions were provided to the                            patient.                           - Resume previous diet.                           - Continue present medications.                           -  Await pathology results.                           - Repeat colonoscopy is recommended for                            surveillance. The colonoscopy date will be                            determined after pathology results from today's                             exam become available for review. Jerene Bears, MD 05/08/2022 2:55:22 PM This report has been signed electronically.

## 2022-05-08 NOTE — Telephone Encounter (Signed)
Patient called she had a colonoscopy done today 05/08/2022. She states she had a small bowel movement, and thinks she passed a polyp. Please Advise.

## 2022-05-08 NOTE — Telephone Encounter (Signed)
Returned patients call. Patient concerned about seeing polyp like tissue in her bowel movement this afternoon. Procedure report reviewed and patient ensured that all polyps have been retrieved. She did not report any bleeding .Patient was appreciative of follow up call from nurse and did not have any additional concerns.Marland Kitchen

## 2022-05-09 ENCOUNTER — Telehealth: Payer: Self-pay | Admitting: *Deleted

## 2022-05-09 ENCOUNTER — Other Ambulatory Visit: Payer: Self-pay

## 2022-05-09 ENCOUNTER — Encounter: Payer: Self-pay | Admitting: *Deleted

## 2022-05-09 ENCOUNTER — Other Ambulatory Visit (HOSPITAL_COMMUNITY): Payer: Self-pay

## 2022-05-09 NOTE — Telephone Encounter (Signed)
  Follow up Call-     05/08/2022    1:11 PM  Call back number  Post procedure Call Back phone  # [phone]  Permission to leave phone message Yes     Patient questions:  Do you have a fever, pain , or abdominal swelling? No. Pain Score  0 *  Have you tolerated food without any problems? Yes.    Have you been able to return to your normal activities? Yes.    Do you have any questions about your discharge instructions: Diet   No. Medications  No. Follow up visit  No.  Do you have questions or concerns about your Care? No.  Actions: * If pain score is 4 or above: No action needed, pain <4.

## 2022-05-10 ENCOUNTER — Other Ambulatory Visit (HOSPITAL_COMMUNITY): Payer: Self-pay

## 2022-05-15 ENCOUNTER — Encounter: Payer: Self-pay | Admitting: Internal Medicine

## 2022-05-16 ENCOUNTER — Other Ambulatory Visit: Payer: Self-pay | Admitting: *Deleted

## 2022-05-23 ENCOUNTER — Other Ambulatory Visit (HOSPITAL_COMMUNITY): Payer: Self-pay

## 2022-06-06 DIAGNOSIS — H179 Unspecified corneal scar and opacity: Secondary | ICD-10-CM | POA: Diagnosis not present

## 2022-06-06 DIAGNOSIS — H16423 Pannus (corneal), bilateral: Secondary | ICD-10-CM | POA: Diagnosis not present

## 2022-06-06 DIAGNOSIS — H5310 Unspecified subjective visual disturbances: Secondary | ICD-10-CM | POA: Diagnosis not present

## 2022-06-21 ENCOUNTER — Other Ambulatory Visit (HOSPITAL_COMMUNITY): Payer: Self-pay

## 2022-06-24 ENCOUNTER — Other Ambulatory Visit (HOSPITAL_COMMUNITY): Payer: Self-pay

## 2022-08-29 ENCOUNTER — Ambulatory Visit: Payer: 59 | Admitting: Internal Medicine

## 2022-09-05 ENCOUNTER — Ambulatory Visit: Payer: 59 | Admitting: Internal Medicine

## 2022-10-11 NOTE — Progress Notes (Signed)
Patient never came to our office to pick up SIBO breath test kit. Kit removed from front desk and returned to stock.

## 2023-03-10 ENCOUNTER — Other Ambulatory Visit: Payer: Self-pay | Admitting: Obstetrics

## 2023-03-10 ENCOUNTER — Other Ambulatory Visit (HOSPITAL_COMMUNITY): Payer: Self-pay

## 2023-03-10 DIAGNOSIS — N6320 Unspecified lump in the left breast, unspecified quadrant: Secondary | ICD-10-CM

## 2023-03-10 LAB — HM MAMMOGRAPHY

## 2023-03-10 MED ORDER — PROGESTERONE MICRONIZED 100 MG PO CAPS
100.0000 mg | ORAL_CAPSULE | Freq: Every day | ORAL | 3 refills | Status: AC
  Filled 2023-03-10: qty 90, 90d supply, fill #0
  Filled 2023-06-27: qty 90, 90d supply, fill #1
  Filled 2023-12-21: qty 90, 90d supply, fill #2

## 2023-03-10 MED ORDER — ESTRADIOL 0.05 MG/24HR TD PTTW
1.0000 | MEDICATED_PATCH | TRANSDERMAL | 3 refills | Status: AC
  Filled 2023-03-10: qty 24, 84d supply, fill #0
  Filled 2023-06-27: qty 24, 84d supply, fill #1
  Filled 2023-12-21: qty 24, 84d supply, fill #2

## 2023-03-11 ENCOUNTER — Ambulatory Visit
Admission: RE | Admit: 2023-03-11 | Discharge: 2023-03-11 | Disposition: A | Discharge status: Home / Self Care | Payer: Self-pay | Source: Ambulatory Visit | Attending: Obstetrics | Admitting: Obstetrics

## 2023-03-11 ENCOUNTER — Ambulatory Visit
Admission: RE | Admit: 2023-03-11 | Discharge: 2023-03-11 | Discharge status: Home / Self Care | Payer: 59 | Source: Ambulatory Visit | Attending: Obstetrics

## 2023-03-11 ENCOUNTER — Other Ambulatory Visit (HOSPITAL_COMMUNITY): Payer: Self-pay

## 2023-03-11 DIAGNOSIS — N6320 Unspecified lump in the left breast, unspecified quadrant: Secondary | ICD-10-CM

## 2023-03-12 ENCOUNTER — Encounter (INDEPENDENT_AMBULATORY_CARE_PROVIDER_SITE_OTHER): Payer: Self-pay

## 2023-03-25 ENCOUNTER — Encounter: Payer: Self-pay | Admitting: Family Medicine

## 2023-04-02 ENCOUNTER — Other Ambulatory Visit (HOSPITAL_BASED_OUTPATIENT_CLINIC_OR_DEPARTMENT_OTHER): Payer: Self-pay

## 2023-06-06 ENCOUNTER — Other Ambulatory Visit (HOSPITAL_COMMUNITY): Payer: Self-pay

## 2023-06-06 MED ORDER — VALACYCLOVIR HCL 500 MG PO TABS
500.0000 mg | ORAL_TABLET | Freq: Three times a day (TID) | ORAL | 1 refills | Status: AC
  Filled 2023-06-06: qty 90, 30d supply, fill #0

## 2023-06-06 MED ORDER — ZIRGAN 0.15 % OP GEL
1.0000 [drp] | Freq: Every day | OPHTHALMIC | 0 refills | Status: AC
  Filled 2023-06-06: qty 5, 1d supply, fill #0

## 2023-06-27 ENCOUNTER — Other Ambulatory Visit (HOSPITAL_COMMUNITY): Payer: Self-pay

## 2023-12-22 ENCOUNTER — Other Ambulatory Visit: Payer: Self-pay

## 2023-12-22 ENCOUNTER — Other Ambulatory Visit (HOSPITAL_COMMUNITY): Payer: Self-pay
